# Patient Record
Sex: Female | Born: 1985 | Hispanic: Yes | Marital: Single | Smoking: Never smoker
Health system: Southern US, Community
[De-identification: ages and names within clinical notes are randomized; demographics above are authoritative.]

## PROBLEM LIST (undated history)

## (undated) DIAGNOSIS — K219 Gastro-esophageal reflux disease without esophagitis: Secondary | ICD-10-CM

## (undated) DIAGNOSIS — Z789 Other specified health status: Secondary | ICD-10-CM

## (undated) DIAGNOSIS — G43909 Migraine, unspecified, not intractable, without status migrainosus: Secondary | ICD-10-CM

---

## 2019-09-01 HISTORY — PX: CHOLECYSTECTOMY: SHX55

## 2019-09-04 ENCOUNTER — Encounter (INDEPENDENT_AMBULATORY_CARE_PROVIDER_SITE_OTHER): Payer: Self-pay | Admitting: Primary Care

## 2019-09-04 ENCOUNTER — Ambulatory Visit (INDEPENDENT_AMBULATORY_CARE_PROVIDER_SITE_OTHER): Payer: Self-pay | Admitting: Primary Care

## 2019-09-04 ENCOUNTER — Other Ambulatory Visit: Payer: Self-pay

## 2019-09-04 DIAGNOSIS — R29898 Other symptoms and signs involving the musculoskeletal system: Secondary | ICD-10-CM

## 2019-09-04 DIAGNOSIS — Z7689 Persons encountering health services in other specified circumstances: Secondary | ICD-10-CM

## 2019-09-04 DIAGNOSIS — K219 Gastro-esophageal reflux disease without esophagitis: Secondary | ICD-10-CM

## 2019-09-04 MED ORDER — OMEPRAZOLE 20 MG PO CPDR: 20.0000 mg | DELAYED_RELEASE_CAPSULE | Freq: Every day | ORAL | 3 refills | Status: DC

## 2019-09-04 MED ORDER — IBUPROFEN 600 MG PO TABS: 600.0000 mg | ORAL_TABLET | Freq: Three times a day (TID) | ORAL | 1 refills | Status: DC | PRN

## 2019-09-04 NOTE — Progress Notes (Signed)
Virtual Visit via Telephone Note  I connected with Monica Peters on 09/04/19 at  8:50 AM EST by telephone and verified that I am speaking with the correct person using two identifiers.   I discussed the limitations, risks, security and privacy concerns of performing an evaluation and management service by telephone and the availability of in person appointments. I also discussed with the patient that there may be a patient responsible charge related to this service. The patient expressed understanding and agreed to proceed.   History of Present Illness: Monica Peters is having a tele visit for establishing care . Complaining of weakness in bilateral hand as we were having tele visit needing to alternate hand.  No past medical history on file.  No current outpatient medications on file prior to visit.   No current facility-administered medications on file prior to visit.   Observations/Objective: Review of Systems  Gastrointestinal: Positive for abdominal pain and heartburn.       Pain at night and spicy food does cause   Neurological: Positive for weakness.       Hands bilateral pain  All other systems reviewed and are negative.   Assessment and Plan: Monica Peters was seen today for new patient (initial visit).  Diagnoses and all orders for this visit:  Encounter to establish care Monica Passe, NP-C will be your  (PCP) mastered prepared that is able to that will  diagnosed and treatment able to answer health concern as well as continuing care of varied medical conditions, not limited by cause, organ system, or diagnosis.   Gastroesophageal reflux disease without esophagitis Discussed eating small frequent meal, reduction in acidic foods, fried foods ,spicy foods, alcohol caffeine and tobacco and certain medications. Avoid laying down after eating 70mins-1hour, elevated head of the bed. Will prescribed omeprazole 20mg  as needed  Weakness of both hands Having pain right shoulder  to hand and left pain from elbow and fingers denies trauma. This has been going on for 5-6 years but notice it is getting worst.  Try anti inflammatory medication if no improvement return for in person appointment. Unknown etiology     Follow Up Instructions:    I discussed the assessment and treatment plan with the patient. The patient was provided an opportunity to ask questions and all were answered. The patient agreed with the plan and demonstrated an understanding of the instructions.   The patient was advised to call back or seek an in-person evaluation if the symptoms worsen or if the condition fails to improve as anticipated.  I provided 15 minutes of non-face-to-face time during this encounter.   , NP

## 2019-09-04 NOTE — Progress Notes (Signed)
Nov 2019  She had a lot of abdominal pain- was told she needed surgery but didn't have PCP  Pain more frequent for the last 3-4 months  Pt complains of pain in hands- can not move more than 10-15 minutes due to pain

## 2019-09-11 ENCOUNTER — Other Ambulatory Visit (INDEPENDENT_AMBULATORY_CARE_PROVIDER_SITE_OTHER): Payer: Self-pay | Admitting: Primary Care

## 2019-09-11 ENCOUNTER — Telehealth (INDEPENDENT_AMBULATORY_CARE_PROVIDER_SITE_OTHER): Payer: Self-pay

## 2019-09-11 NOTE — Telephone Encounter (Signed)
Patient called to see the status of her anti inflammatory medication. States PCP advice she would send the Rx to the pharmacy on jan 4.  Patient uses [id number] - [place], [place] - [address] AT [facility] [address]   Please advice [phone]  Thank you Lula Olszewski

## 2019-09-11 NOTE — Telephone Encounter (Signed)
Ibuprofen sent 09/04/2019

## 2019-09-11 NOTE — Telephone Encounter (Signed)
FWD to PCP

## 2019-10-05 ENCOUNTER — Ambulatory Visit (INDEPENDENT_AMBULATORY_CARE_PROVIDER_SITE_OTHER): Payer: Self-pay | Admitting: Primary Care

## 2019-10-06 ENCOUNTER — Other Ambulatory Visit: Payer: Self-pay

## 2019-10-06 ENCOUNTER — Encounter (INDEPENDENT_AMBULATORY_CARE_PROVIDER_SITE_OTHER): Payer: Self-pay | Admitting: Primary Care

## 2019-10-06 ENCOUNTER — Ambulatory Visit (INDEPENDENT_AMBULATORY_CARE_PROVIDER_SITE_OTHER): Payer: Self-pay | Admitting: Primary Care

## 2019-10-06 VITALS — BP 127/84 | HR 99 | Temp 97.3°F | Ht 66.54 in | Wt 246.4 lb

## 2019-10-06 DIAGNOSIS — G629 Polyneuropathy, unspecified: Secondary | ICD-10-CM

## 2019-10-06 DIAGNOSIS — Z23 Encounter for immunization: Secondary | ICD-10-CM

## 2019-10-06 MED ORDER — GABAPENTIN 100 MG PO CAPS: 100.0000 mg | ORAL_CAPSULE | Freq: Three times a day (TID) | ORAL | 3 refills | Status: DC

## 2019-10-06 NOTE — Progress Notes (Signed)
Established Patient Office Visit  Subjective:  Patient ID: Monica Peters, female    DOB: 1986/07/22  Age: 34 y.o. MRN: [id number]  CC:  Chief Complaint  Patient presents with  . Abdominal Pain  . Hand Pain    bilateral     HPI Monica Peters presents for acute visit bilateral shoulder pain that migrates to finger tips.    History reviewed. No pertinent past medical history.  History reviewed. No pertinent family history.  Social History   Socioeconomic History  . Marital status: Single    Spouse name: Not on file  . Number of children: Not on file  . Years of education: Not on file  . Highest education level: Not on file  Occupational History  . Not on file  Tobacco Use  . Smoking status: Never Smoker  . Smokeless tobacco: Never Used  Substance and Sexual Activity  . Alcohol use: Never  . Drug use: Never  . Sexual activity: Not Currently  Other Topics Concern  . Not on file  Social History Narrative  . Not on file   Social Determinants of Health   Financial Resource Strain:   . Difficulty of Paying Living Expenses: Not on file  Food Insecurity:   . Worried About Programme researcher, broadcasting/film/video in the Last Year: Not on file  . Ran Out of Food in the Last Year: Not on file  Transportation Needs:   . Lack of Transportation (Medical): Not on file  . Lack of Transportation (Non-Medical): Not on file  Physical Activity:   . Days of Exercise per Week: Not on file  . Minutes of Exercise per Session: Not on file  Stress:   . Feeling of Stress : Not on file  Social Connections:   . Frequency of Communication with Friends and Family: Not on file  . Frequency of Social Gatherings with Friends and Family: Not on file  . Attends Religious Services: Not on file  . Active Member of Clubs or Organizations: Not on file  . Attends Banker Meetings: Not on file  . Marital Status: Not on file  Intimate Partner Violence:   . Fear of Current or Ex-Partner: Not on file   . Emotionally Abused: Not on file  . Physically Abused: Not on file  . Sexually Abused: Not on file    Outpatient Medications Prior to Visit  Medication Sig Dispense Refill  . ibuprofen (ADVIL) 600 MG tablet Take 1 tablet (600 mg total) by mouth every 8 (eight) hours as needed for moderate pain. 90 tablet 1  . omeprazole (PRILOSEC) 20 MG capsule Take 1 capsule (20 mg total) by mouth daily. 30 capsule 3   No facility-administered medications prior to visit.    No Known Allergies  ROS Review of Systems  Musculoskeletal:       Hands painful every day especially at night . Pain increases with uses      Objective:    Physical Exam  Constitutional: She appears well-developed and well-nourished.  HENT:  Head: Normocephalic.  Eyes: Pupils are equal, round, and reactive to light. EOM are normal.  Cardiovascular: Normal rate and regular rhythm.  Pulmonary/Chest: Effort normal.  Abdominal: Soft. Bowel sounds are normal.  Musculoskeletal:     Cervical back: Normal range of motion.    BP 127/84 (BP Location: Right Arm, Patient Position: Sitting, Cuff Size: Large)   Pulse 99   Temp (!) 97.3 F (36.3 C) (Temporal)   Ht 5' 6.54" (1.69 m)  Wt 246 lb 6.4 oz (111.8 kg)   LMP 09/27/2019 (Exact Date)   SpO2 96%   BMI 39.13 kg/m  Wt Readings from Last 3 Encounters:  10/06/19 246 lb 6.4 oz (111.8 kg)     Health Maintenance Due  Topic Date Due  . HIV Screening  09/10/1998  . PAP SMEAR-Modifier  09/10/2004     Assessment & Plan:  Monica Peters was seen today for abdominal pain and hand pain.  Diagnoses and all orders for this visit:  Neuropathy Peripheral neuropathy develops slowly over time leading to a loss of sensation. Burning, stabbing, or aching pain in the legs or feet are common symptoms. abapentin (NEURONTIN) 100 MG capsule; Take 1 capsule (100 mg total) by mouth 3 (three) times daily.  Need for Tdap vaccination Tdap is recommended every 10 years for adults weekly or  primary she gets tetanus..  At least 1 of those doses should be with Tdap in adults age 61 and older who have previously received Tdap.  Recommend by the CDC. -     Tdap vaccine greater than or equal to 7yo IM  Need for immunization against influenza CDC recommends influenza vaccine yearly.   -     Flu Vaccine QUAD 36+ mos IM  Other orders -     gabapentin (NEURONTIN) 100 MG capsule; Take 1 capsule (100 mg total) by mouth 3 (three) times daily.    Meds ordered this encounter  Medications  . gabapentin (NEURONTIN) 100 MG capsule    Sig: Take 1 capsule (100 mg total) by mouth 3 (three) times daily.    Dispense:  90 capsule    Refill:  3    Follow-up: Return in about 2 months (around 12/04/2019) for schedule pap Tele for medication effectivness .    Kerin Perna, NP

## 2019-10-06 NOTE — Progress Notes (Signed)
Feels pain in hands when writing and the pain radiates to shoulder

## 2019-10-06 NOTE — Patient Instructions (Signed)
Dolor neuroptico Neuropathic Pain El dolor neuroptico se produce cuando hay dao en los nervios responsables de ciertas sensaciones del cuerpo (nervios sensitivos). Las causas del dolor pueden ser las siguientes:  Dao a los nervios sensitivos que envan seales a la mdula espinal y el cerebro (sistema nervioso perifrico).  Dao a los nervios sensitivos del cerebro o la mdula espinal (sistema nervioso central). El dolor neuroptico puede volverlo ms sensible al [employer]. An una sensacin leve puede sentirse Writer. Por lo general, es una enfermedad a largo plazo que puede ser difcil de tratar. El tipo de dolor difiere de Ardelia Mems persona a Theatre manager. Puede:  Comenzar en forma repentina (agudo) o puede desarrollarse lentamente y durar Management consultant (crnico).  Aparecer y Armed forces operational officer a medida que los nervios daados se curan, o puede permanecer estable durante aos.  Provoca [place], prdida de sueo y [place] calidad de vida deficiente. Cules son las causas? La causa ms frecuente de esta afeccin es la diabetes. El dolor neuroptico tambin puede producirse por muchas otras enfermedades y afecciones. Las causas del dolor neuroptico pueden clasificarse como:  Txicas. Esto es causado por medicamentos y sustancias qumicas. La causa ms comn del dolor neuroptico por exposicin a sustancias txicas es el dao que producen los tratamientos para Science writer (quimioterapia).  Metablicas. Esto puede ser consecuencia de: ? Diabetes. Esta es la enfermedad ms comn que provoca daos en los nervios. ? Falta de vitamina B debido al consumo de alcohol prolongado.  Traumticas. Las lesiones que cortan, presionan o estiran un nervio pueden producir dao y Social research officer, government. Un ejemplo comn es sentir dolor despus de perder un brazo o una pierna (dolor de miembro fantasma).  Causas relacionadas con la compresin. Si un nervio sensitivo queda atrapado o comprimido por [employer], el suministro de sangre al  nervio puede interrumpirse.  Vascular. Muchas enfermedades de los vasos sanguneos pueden producir dolor neuroptico al reducir el suministro de [place] y el oxgeno que Lucianne Lei a los nervios.  Autoinmune. Este tipo de Social research officer, government se produce por las [employer] el sistema de defensa del cuerpo (sistema inmunitario) ataca por error los nervios sensitivos. Entre los ejemplos de enfermedades autoinmunes que pueden causar dolor neuroptico se incluyen el lupus y Curator.  Infecciosa. Muchos tipos de infecciones virales pueden daar los nervios sensitivos y Engineer, drilling. La infeccin por culebrilla (virus del herpes zster) es una causa comn de este tipo de Social research officer, government.  Heredados. El dolor neuroptico puede ser un sntoma de muchas enfermedades que se transmiten entre los miembros de las familias (genticas). Qu incrementa el riesgo? Es ms probable que usted sufra esta afeccin si:  Tiene diabetes.  Fuma.  Bebe alcohol en exceso.  Toma determinados medicamentos, incluidos medicamentos que destruyen las clulas cancerosas (quimioterapia) o que se utilizan para tratar trastornos del sistema inmunitario. Cules son los signos o los sntomas? El sntoma principal es Conservation officer, historic buildings. El dolor neuroptico a menudo se describe como:  Insurance claims handler.  Similar a un choque.  Escozor.  Fro o calor.  Picazn. Cmo se diagnostica? No hay un estudio que pueda diagnosticar el dolor neuroptico. Se diagnostica en funcin de lo siguiente:  Un examen fsico y sus sntomas. El mdico le har preguntas acerca de su dolor. Pueden solicitarle que use una escala de dolor para describir la intensidad del dolor.  Estudios. Estos pueden realizarse para corroborar su sensibilidad al dolor y para ayudar a Animator causa y la ubicacin de los daos en los nervios sensitivos. Estos  incluyen los siguientes: ? Estudios de conduccin nerviosa para evaluar si las seales nerviosas pasan por los nervios  sensitivos en forma correcta o no (estudios electrodiagnsticos). ? Estimulacin de los nervios sensitivos a travs de electrodos que se colocan en la piel, y la medicin de la respuesta en la mdula espinal y el cerebro (potencial evocado somatosensorial).  Estudios de diagnstico por imgenes, por ejemplo: ? Radiografas. ? Exploracin por tomografa computarizada (TC). ? Resonancia magntica (RM). Cmo se trata? El tratamiento para el dolor neuroptico puede cambiar con el Marrero. Es posible que necesite probar distintas opciones de tratamientos o una combinacin de tratamientos. Entre las opciones se incluyen las siguientes:  Tratamiento de la causa preexistente de la neuropata, como la diabetes, una enfermedad renal o la deficiencia de vitaminas.  Interrupcin de los [employer] pueden causar la neuropata, como la quimioterapia.  Medicamentos para Engineer, materials. Entre los medicamentos se [employer] siguientes: ? Analgsicos de venta libre o recetados. ? Anticonvulsivos. ? Antidepresivos. ? Parches analgsicos que se colocan en las zonas de la piel que le duelen. ? Un medicamento para adormecer el rea (anestesia local), que puede inyectarse como un bloqueo nervioso.  Neuroestimulacin transcutnea. En este tratamiento se utilizan corrientes elctricas para bloquear las seales nerviosas dolorosas. El tratamiento es indoloro.  Tratamientos alternativos, como: ? Acupuntura. ? Meditacin. ? Masajes. ? Fisioterapia. ? Programas para el control del dolor. ? Psicoterapia. Siga estas indicaciones en su casa: Medicamentos   [employer] International de venta libre y los recetados solamente como se lo haya indicado el mdico.  No conduzca ni use maquinaria pesada mientras toma analgsicos recetados.  Si toma analgsicos recetados, tome medidas para prevenir o tratar el estreimiento. El mdico puede recomendarle que: ? Beba suficiente lquido como para mantener la orina de  color amarillo plido. ? Consuma alimentos ricos en fibra, como frutas y verduras frescas, cereales integrales y frijoles. ? Limite el consumo de alimentos ricos en grasa y azcares procesados, como los alimentos fritos o dulces. ? Tome un medicamento recetado o de venta libre para el estreimiento. Estilo de vida   Tenga un buen sistema de Immunologist.  Considere la opcin de unirse a un grupo de apoyo para Chief Technology Officer crnico.  No consuma ningn producto que contenga nicotina o tabaco, como cigarrillos y Administrator, Civil Service. Si necesita ayuda para dejar de fumar, consulte al mdico.  No beba alcohol. Instrucciones generales  Infrmese todo lo que pueda sobre su enfermedad.  Trabaje en estrecha colaboracin con todos sus mdicos para hallar el tratamiento ms adecuado para usted.  Pregntele al mdico qu actividades son seguras para usted.  Concurra a todas las visitas de control como se lo haya indicado el mdico. Esto es importante. Comunquese con un mdico si:  Sus tratamientos para el dolor no funcionan.  Los [employer] Us causan [employer] Corporation.  Est lidiando con sntomas de cansancio (fatiga), cambios en el estado de nimo, depresin o ansiedad. Resumen  El dolor neuroptico se produce cuando hay dao en los nervios responsables de ciertas sensaciones del cuerpo (nervios sensitivos).  El dolor neuroptico puede aparecer y Geneticist, molecular a medida que los nervios daados se curan, o puede permanecer estable durante aos.  Por lo general, el dolor neuroptico es una enfermedad a largo plazo que puede ser difcil de Warehouse manager. Considere la opcin de unirse a un grupo de apoyo para Chief Technology Officer crnico. Esta informacin no tiene Theme park manager el consejo del mdico. Asegrese de hacerle al mdico  cualquier pregunta que tenga. Document Revised: 10/27/2017 Document Reviewed: 10/27/2017 Elsevier Patient Education  2020 [employer].

## 2019-10-25 DIAGNOSIS — R7989 Other specified abnormal findings of blood chemistry: Secondary | ICD-10-CM | POA: Insufficient documentation

## 2019-12-04 ENCOUNTER — Other Ambulatory Visit: Payer: Self-pay

## 2019-12-04 ENCOUNTER — Telehealth (INDEPENDENT_AMBULATORY_CARE_PROVIDER_SITE_OTHER): Payer: Self-pay | Admitting: Primary Care

## 2019-12-04 DIAGNOSIS — G629 Polyneuropathy, unspecified: Secondary | ICD-10-CM

## 2019-12-04 NOTE — Progress Notes (Signed)
Virtual Visit via Telephone Note  I connected with [name] on 12/04/19 at 10:30 AM EDT by telephone and verified that I am speaking with the correct person using two identifiers.   I discussed the limitations, risks, security and privacy concerns of performing an evaluation and management service by telephone and the availability of in person appointments. I also discussed with the patient that there may be a patient responsible charge related to this service. The patient expressed understanding and agreed to proceed.   History of Present Illness: [name] is a 34 year old female having tele for medication follow up on gabapentin 100mg  three times a day for numbness and tingling in her arms and fingers. Today she states her hand gets stiff and it is difficult to use.Fingers has stiffness ,last of sensation  Medication is a little better but not much.  No past medical history on file.  Current Outpatient Medications on File Prior to Visit  Medication Sig Dispense Refill  . gabapentin (NEURONTIN) 100 MG capsule Take 1 capsule (100 mg total) by mouth 3 (three) times daily. (Patient taking differently: Take 100 mg by mouth 3 (three) times daily. Take 2 100mg  in the morning and 100mg  at lunch bedtime (3) 100mg ) 90 capsule 3  . ibuprofen (ADVIL) 600 MG tablet Take 1 tablet (600 mg total) by mouth every 8 (eight) hours as needed for moderate pain. 90 tablet 1  . omeprazole (PRILOSEC) 20 MG capsule Take 1 capsule (20 mg total) by mouth daily. 30 capsule 3   No current facility-administered medications on file prior to visit.   Observations/Objective: Review of Systems  Musculoskeletal:       Arm and hand pain and numbness  All other systems reviewed and are negative.  Assessment and Plan: Prentiss was seen today for medication effectiveness. On previous visit started on gabapentin 100 mg three time a day. This had little improvement   Diagnoses and all orders for this  visit:  Neuropathy Peripheral neuropathy develops slowly over time leading to a loss of sensation. Increased gabapentin to 2 in AM she wakes up with hands and finger stinging sensation and at bed time she will remain at 1 tablet mid day.    Follow Up Instructions:    I discussed the assessment and treatment plan with the patient. The patient was provided an opportunity to ask questions and all were answered. The patient agreed with the plan and demonstrated an understanding of the instructions.   The patient was advised to call back or seek an in-person evaluation if the symptoms worsen or if the condition fails to improve as anticipated.  I provided 8 minutes of non-face-to-face time during this encounter.   , NP

## 2019-12-27 ENCOUNTER — Other Ambulatory Visit: Payer: Self-pay

## 2019-12-27 ENCOUNTER — Ambulatory Visit: Payer: Self-pay

## 2020-01-26 ENCOUNTER — Encounter (INDEPENDENT_AMBULATORY_CARE_PROVIDER_SITE_OTHER): Payer: Self-pay | Admitting: Primary Care

## 2020-01-26 ENCOUNTER — Other Ambulatory Visit: Payer: Self-pay

## 2020-01-26 ENCOUNTER — Telehealth (INDEPENDENT_AMBULATORY_CARE_PROVIDER_SITE_OTHER): Payer: Self-pay | Admitting: Primary Care

## 2020-01-26 DIAGNOSIS — G629 Polyneuropathy, unspecified: Secondary | ICD-10-CM

## 2020-01-26 MED ORDER — GABAPENTIN 300 MG PO CAPS: 300.0000 mg | ORAL_CAPSULE | Freq: Three times a day (TID) | ORAL | 1 refills | Status: DC

## 2020-01-26 NOTE — Progress Notes (Signed)
Gabapentin helping some. When she eats she still has pain in her hands and still having pain at night but not as bad as it was before  Pt is having pain in her hand that she is currently holding her phone with

## 2020-01-26 NOTE — Progress Notes (Addendum)
Virtual Visit via Telephone Note  I connected with Monica Peters on 01/26/20 at  9:50 AM EDT by telephone and verified that I am speaking with the correct person using two identifiers.   I discussed the limitations, risks, security and privacy concerns of performing an evaluation and management service by telephone and the availability of in person appointments. I also discussed with the patient that there may be a patient responsible charge related to this service. The patient expressed understanding and agreed to proceed. Patient home  Monica Passe, NP was at [facility]    History of Present Illness: [name] is 34 year female having a tele visit for effectiveness of Gabapentin . States it is helping some. Pain in fingers is worst in the evening . Increase when cooking, chopping food and needing to switch hands while holding the phone.  No past medical history on file.  No past medical history on file.   Observations/Objective: Review of Systems  Neurological: Positive for tingling and weakness.  All other systems reviewed and are negative.   Assessment and Plan: Monica Peters was seen today for medication effectiveness.  Diagnoses and all orders for this visit:  Neuropathy See HPI- increase gabapentin to 300mg  three times a day. She is experiencing  neuropathy which is causing aching pain, weakness and numbness and tingling. If no improvement will refer to ortho/neurology.   Follow Up Instructions:    I discussed the assessment and treatment plan with the patient. The patient was provided an opportunity to ask questions and all were answered. The patient agreed with the plan and demonstrated an understanding of the instructions.   The patient was advised to call back or seek an in-person evaluation if the symptoms worsen or if the condition fails to improve as anticipated.  I provided 12 minutes of non-face-to-face time during this  encounter.   , NP

## 2020-02-12 ENCOUNTER — Telehealth: Payer: Self-pay | Admitting: Primary Care

## 2020-02-12 NOTE — Telephone Encounter (Signed)
Pt was advice that to bring the bill to the clinic that be able to take care

## 2020-04-12 ENCOUNTER — Encounter (INDEPENDENT_AMBULATORY_CARE_PROVIDER_SITE_OTHER): Payer: Self-pay | Admitting: Primary Care

## 2020-04-12 ENCOUNTER — Other Ambulatory Visit: Payer: Self-pay

## 2020-04-12 ENCOUNTER — Ambulatory Visit (INDEPENDENT_AMBULATORY_CARE_PROVIDER_SITE_OTHER): Payer: Self-pay | Admitting: Primary Care

## 2020-04-12 VITALS — BP 125/73 | HR 76 | Wt 244.6 lb

## 2020-04-12 DIAGNOSIS — G4733 Obstructive sleep apnea (adult) (pediatric): Secondary | ICD-10-CM

## 2020-04-12 DIAGNOSIS — R0602 Shortness of breath: Secondary | ICD-10-CM

## 2020-04-12 DIAGNOSIS — Z6839 Body mass index (BMI) 39.0-39.9, adult: Secondary | ICD-10-CM

## 2020-04-12 DIAGNOSIS — E6609 Other obesity due to excess calories: Secondary | ICD-10-CM

## 2020-04-12 MED ORDER — ALBUTEROL SULFATE HFA 108 (90 BASE) MCG/ACT IN AERS: 2.0000 | INHALATION_SPRAY | Freq: Four times a day (QID) | RESPIRATORY_TRACT | 0 refills | Status: AC | PRN

## 2020-04-12 MED FILL — ALBUTEROL SULFATE HFA 108 (: 108 (90 BAS | 25 days supply | Qty: 18 | Fill #0

## 2020-04-12 NOTE — Patient Instructions (Signed)
Falta de ar, adultos Shortness of Breath, Adult Falta de ar significa que voc tem problemas para respirar. A falta de ar pode ser um sinal de algum problema mdico. Siga essas instrues em casa:   Fique atento a eventuais alteraes em seus sintomas.  No use nenhum produto que contenha nicotina ou tabaco, como cigarros tradicionais, cigarros eletrnicos e fumo de Theatre manager.  No fume. O tabagismo pode causar falta de ar. Caso precise de ajuda para parar de fumar, fale com seu mdico.  Evite coisas que podem dificultar a respirao, tais como: ? Mofo. ? Poeira. ? Poluio do ar. ? Cheiros de substncias qumicas. ? Coisas que possam causar sintomas de alergia (alrgenos), caso voc [employer].  Mantenha sua casa limpa. Use produtos que ajudem a remover o mofo e a poeira.  Repouse conforme necessrio. Retorne gradualmente s suas atividades normais.  Tome medicamentos vendidos com ou sem prescrio somente de acordo com as indicaes do seu mdico. Isso inclui oxigenoterapia e outros medicamento por via inalatria.  Comparea a todas as consultas de acompanhamento de acordo com as orientaes do seu mdico. Isso  importante. Entre em contato com um mdico se:  Seu quadro clnico no melhorar quando esperado.  Tiver dificuldades para realizar atividades normais mesmo depois de repousar.  Surgirem novos sintomas. Tomasa Hose ajuda imediatamente se:  A falta de ar piorar.  Tiver dificuldade para respirar quando em repouso.  Sentir Equities trader.  Ocorrer tosse no aliviada por medicamentos.  Tossir sangue.  Sentir dor Production assistant, radio.  Sentir dor no peito, braos, ombros ou barriga (abdome).  Tiver febre.  No conseguir subir escadas.  No conseguir fazer exerccios Horticulturist, commercial. Esses sintomas podem representar um problema srio e ser [employer]. No espere para ver se os sintomas desaparecem. Procure um mdico imediatamente. Ligue para o nmero de  Technical brewer (911, nos EUA). No dirija por conta prpria at o hospital. Resumo  Falta de ar  quando voc tem dificuldade de respirar ar suficiente. Ela pode ser um sinal de um problema mdico.  Evite coisas que atrapalhem a respirao, como fumar, poluio, mofo e poeira.  Fique atento a eventuais alteraes em seus sintomas. Entre em contato com seu mdico se voc no Industrial/product designer. Estas informaes no se destinam a substituir as recomendaes de seu mdico. No deixe de discutir quaisquer dvidas com seu mdico. Document Revised: 03/01/2018 Document Reviewed: 03/01/2018 Elsevier Patient Education  2020 [employer].

## 2020-04-12 NOTE — Progress Notes (Signed)
Patient is here today for SOB that has been going on for 3 weeks.  Having pain in back of neck.

## 2020-04-12 NOTE — Progress Notes (Signed)
Established Patient Office Visit  Subjective:  Patient ID: Monica Peters, female    DOB: 20-Jul-1986  Age: 34 y.o. MRN: [id number]  CC:  Chief Complaint  Patient presents with  . Shortness of Breath    HPI [name] is a 34 year old Hispanic female who speaks and understands English but prefers using  Language  Line.(interpreter Randall Hiss [phone]) . She presents for intermittent shortness of breath not related to  yny specific.  This has been lasting for several weeks.  History reviewed. No pertinent past medical history.  History reviewed. No pertinent surgical history.  History reviewed. No pertinent family history.  Social History   Socioeconomic History  . Marital status: Single    Spouse name: Not on file  . Number of children: Not on file  . Years of education: Not on file  . Highest education level: Not on file  Occupational History  . Not on file  Tobacco Use  . Smoking status: Never Smoker  . Smokeless tobacco: Never Used  Substance and Sexual Activity  . Alcohol use: Never  . Drug use: Never  . Sexual activity: Not Currently  Other Topics Concern  . Not on file  Social History Narrative  . Not on file   Social Determinants of Health   Financial Resource Strain:   . Difficulty of Paying Living Expenses:   Food Insecurity:   . Worried About Charity fundraiser in the Last Year:   . Arboriculturist in the Last Year:   Transportation Needs:   . Film/video editor (Medical):   Marland Kitchen Lack of Transportation (Non-Medical):   Physical Activity:   . Days of Exercise per Week:   . Minutes of Exercise per Session:   Stress:   . Feeling of Stress :   Social Connections:   . Frequency of Communication with Friends and Family:   . Frequency of Social Gatherings with Friends and Family:   . Attends Religious Services:   . Active Member of Clubs or Organizations:   . Attends Archivist Meetings:   Marland Kitchen Marital Status:   Intimate  Partner Violence:   . Fear of Current or Ex-Partner:   . Emotionally Abused:   Marland Kitchen Physically Abused:   . Sexually Abused:     Outpatient Medications Prior to Visit  Medication Sig Dispense Refill  . ibuprofen (ADVIL) 600 MG tablet Take 1 tablet (600 mg total) by mouth every 8 (eight) hours as needed for moderate pain. 90 tablet 1  . gabapentin (NEURONTIN) 300 MG capsule Take 1 capsule (300 mg total) by mouth 3 (three) times daily. (Patient not taking: Reported on 04/12/2020) 90 capsule 1  . omeprazole (PRILOSEC) 20 MG capsule Take 1 capsule (20 mg total) by mouth daily. (Patient not taking: Reported on 04/12/2020) 30 capsule 3   No facility-administered medications prior to visit.    No Known Allergies  ROS Review of Systems  Respiratory: Positive for shortness of breath.   Neurological: Positive for headaches.  Psychiatric/Behavioral: Positive for sleep disturbance.  All other systems reviewed and are negative.     Objective:    Physical Exam Vitals reviewed.  Constitutional:      Appearance: She is obese.  HENT:     Head: Normocephalic.  Cardiovascular:     Rate and Rhythm: Normal rate and regular rhythm.  Pulmonary:     Effort: Pulmonary effort is normal.     Breath sounds: Normal breath sounds.  Abdominal:  General: Bowel sounds are normal.  Musculoskeletal:        General: Normal range of motion.  Skin:    General: Skin is warm and dry.  Neurological:     Mental Status: She is alert and oriented to person, place, and time.  Psychiatric:        Mood and Affect: Mood normal.        Behavior: Behavior normal.     BP 125/73   Pulse 76   Wt 244 lb 9.6 oz (110.9 kg)   SpO2 98%   BMI 38.85 kg/m  Wt Readings from Last 3 Encounters:  04/12/20 244 lb 9.6 oz (110.9 kg)  10/06/19 246 lb 6.4 oz (111.8 kg)     Health Maintenance Due  Topic Date Due  . Hepatitis C Screening  Never done  . HIV Screening  Never done  . PAP SMEAR-Modifier  Never done  .  INFLUENZA VACCINE  03/31/2020    There are no preventive care reminders to display for this patient.  No results found for: TSH No results found for: WBC, HGB, HCT, MCV, PLT No results found for: NA, K, CHLORIDE, CO2, GLUCOSE, BUN, CREATININE, BILITOT, ALKPHOS, AST, ALT, PROT, ALBUMIN, CALCIUM, ANIONGAP, EGFR, GFR No results found for: CHOL No results found for: HDL No results found for: LDLCALC No results found for: TRIG No results found for: CHOLHDL No results found for: HGBA1C    Assessment & Plan:  Monica Peters was seen today for shortness of breath.  Diagnoses and all orders for this visit:  OSA (obstructive sleep apnea) Patient presents with possible obstructive sleep apnea. Patent has a 1 year history of symptoms of daytime fatigue, morning fatigue and morning headache. Patient generally gets 3 or 4 hours of sleep per night, and states they generally have difficulty falling asleep. Snoring of moderate severity is present. Apneic episodes is present. Nasal obstruction is not present.  Patient has not had tonsillectomy.   Class 2 obesity due to excess calories without serious comorbidity with body mass index (BMI) of 39.0 to 39.9 in adult Obesity is 30-39 indicating an excess in caloric intake or underlining conditions. This may lead to other co-morbidities. HTN, CAD, hypoventilation syndrome  Lifestyle modifications of diet and exercise may reduce obesity.   Shortness of breath Will refer to Dr. Joya Gaskins pulmonologist and prescribed a short acting bronchodilator for rescue with shortness of breath.  Meds ordered this encounter  Medications  . albuterol (VENTOLIN HFA) 108 (90 Base) MCG/ACT inhaler    Sig: Inhale 2 puffs into the lungs every 6 (six) hours as needed for wheezing or shortness of breath.    Dispense:  8 g    Refill:  0    Follow-up: Return if symptoms worsen or fail to improve, for First avaiable with Dr. Joya Gaskins and Pap.    Kerin Perna, NP

## 2020-04-24 ENCOUNTER — Other Ambulatory Visit (HOSPITAL_COMMUNITY)
Admission: RE | Admit: 2020-04-24 | Discharge: 2020-04-24 | Disposition: A | Discharge status: Home / Self Care | Payer: Self-pay | Source: Ambulatory Visit | Attending: Primary Care | Admitting: Primary Care

## 2020-04-24 ENCOUNTER — Other Ambulatory Visit: Payer: Self-pay

## 2020-04-24 ENCOUNTER — Ambulatory Visit (INDEPENDENT_AMBULATORY_CARE_PROVIDER_SITE_OTHER): Payer: Self-pay | Admitting: Primary Care

## 2020-04-24 ENCOUNTER — Encounter (INDEPENDENT_AMBULATORY_CARE_PROVIDER_SITE_OTHER): Payer: Self-pay | Admitting: Primary Care

## 2020-04-24 VITALS — BP 124/84 | HR 77 | Temp 98.1°F | Resp 16 | Wt 242.2 lb

## 2020-04-24 DIAGNOSIS — Z1159 Encounter for screening for other viral diseases: Secondary | ICD-10-CM

## 2020-04-24 DIAGNOSIS — N898 Other specified noninflammatory disorders of vagina: Secondary | ICD-10-CM

## 2020-04-24 DIAGNOSIS — E6609 Other obesity due to excess calories: Secondary | ICD-10-CM

## 2020-04-24 DIAGNOSIS — Z0000 Encounter for general adult medical examination without abnormal findings: Secondary | ICD-10-CM

## 2020-04-24 DIAGNOSIS — Z01419 Encounter for gynecological examination (general) (routine) without abnormal findings: Secondary | ICD-10-CM

## 2020-04-24 DIAGNOSIS — Z124 Encounter for screening for malignant neoplasm of cervix: Secondary | ICD-10-CM

## 2020-04-24 DIAGNOSIS — N921 Excessive and frequent menstruation with irregular cycle: Secondary | ICD-10-CM

## 2020-04-24 DIAGNOSIS — Z114 Encounter for screening for human immunodeficiency virus [HIV]: Secondary | ICD-10-CM

## 2020-04-24 DIAGNOSIS — Z6839 Body mass index (BMI) 39.0-39.9, adult: Secondary | ICD-10-CM

## 2020-04-24 NOTE — Progress Notes (Signed)
  Subjective:      Ms. Kayzlee Wirtanen Su Grand is a 34 y.o. woman who comes in today for a  pap smear  And health maintenance only.  Her most recent Pap smear was unknown. She does express having some dental pain . Information will be provided for dental options.   Review of Systems Pertinent items are noted in HPI.   Objective:    BP 124/84   Pulse 77   Temp 98.1 F (36.7 C)   Resp 16   Wt 242 lb 3.2 oz (109.9 kg)   SpO2 99%   BMI 38.47 kg/m    Assessment:  CONSTITUTIONAL: Well-developed, well-nourished obese  female in no acute distress.  HENT:  Normocephalic, atraumatic, External right and left ear normal. Oropharynx is clear and moist EYES: Conjunctivae and EOM are normal. Pupils are equal, round, and reactive to light. No scleral icterus.  NECK: Normal range of motion, supple, no masses.  Normal thyroid.  SKIN: Skin is warm and dry. No rash noted. Not diaphoretic. No erythema. No pallor. [place]: Alert and oriented to person, place, and time. Normal reflexes, muscle tone coordination. No cranial nerve deficit noted. PSYCHIATRIC: Normal mood and affect. Normal behavior. Normal judgment and thought content. CARDIOVASCULAR: Normal heart rate noted, regular rhythm RESPIRATORY: Clear to auscultation bilaterally. Effort and breath sounds normal, no problems with respiration noted. BREASTS: Symmetric in size. No masses, skin changes, nipple drainage, or lymphadenopathy. Taught SBE demonstrated on left she return demonstrated on the right  ABDOMEN: Soft, normal bowel sounds, no distention noted.  No tenderness, rebound or guarding.  PELVIC: Normal appearing external genitalia; normal appearing vaginal mucosa and cervix.  No abnormal discharge noted.  Pap smear obtained.  Normal uterine size, no other palpable masses, no uterine or adnexal tenderness. MUSCULOSKELETAL: Normal range of motion. No tenderness.  No cyanosis, clubbing, or edema.  2+ distal pulses.  Screening pap smear.    Plan:    Follow up in 3 years, or as indicated by Pap results.   Ryleeann was seen today for gynecologic exam.  Diagnoses and all orders for this visit:  Cervical cancer screening -     Cytology - PAP  Vaginal discharge -     Cervicovaginal ancillary only  Encounter for screening for HIV -     HIV Antibody (routine testing w rflx)  Encounter for hepatitis C screening test for low risk patient -     Hepatitis C Antibody  Menorrhagia with irregular cycle -     CBC with Differential  Class 2 obesity due to excess calories without serious comorbidity with body mass index (BMI) of 39.0 to 39.9 in adult -     CMP14+EGFR; Future -     Lipid Panel  Healthcare maintenance -     CMP14+EGFR; Future  Encounter for gynecological examination without abnormal finding Completed

## 2020-04-24 NOTE — Patient Instructions (Addendum)
Prueba de Papanicolaou Pap Test Por qu me debo realizar esta prueba? La prueba de Papanicolaou, tambin denominada citologa vaginal, es una prueba de cribado para Hydrographic surveyor signos de:  Cncer de la vagina, del cuello uterino y del tero. El cuello uterino es la parte baja del tero que se abre hacia la vagina.  Infeccin.  Cambios que podran ser un signo de que se est desarrollando un cncer (cambios precancerosos). Las mujeres deben realizarse esta prueba con regularidad. En general, debe hacerse una prueba de Papanicolaou cada 3 aos hasta alcanzar la menopausia o hasta los 65 aos. Las [employer] 30 y 68 aos de edad pueden elegir realizarse la prueba de Papanicolaou al mismo tiempo que la prueba del VPH (virus del papiloma humano) cada 5 aos (en lugar de cada 3 aos). El mdico puede recomendarle que se realice pruebas de Papanicolaou con ms o menos frecuencia en funcin de sus afecciones mdicas y los resultados de la prueba de Papanicolaou anterior. Qu tipo de [place] se toma?  El mdico recolectar una muestra de clulas de la superficie del cuello uterino. Lo har utilizando un pequeo hisopo de algodn, una esptula de plstico o un cepillo. Esta muestra se recolecta durante un examen plvico, mientras usted est recostada boca arriba sobre la mesa de examen con los pies en los descansos para pies (estribos). En algunos casos, tambin pueden recolectarse fluidos (secreciones) del cuello uterino y la vagina. Cmo debo prepararme para esta prueba?  Tenga en cuenta en qu etapa del ciclo menstrual se encuentra. Es posible que se le pida que vuelva a Risk manager la prueba si est Forensic psychologist en que debe Radiation protection practitioner.  Si el da en que debe realizarse la prueba tiene una infeccin vaginal aparente, deber volver a Editor, commissioning prueba.  Siga las indicaciones del mdico acerca de lo siguiente: ? Cambiar o suspender los medicamentos que toma habitualmente. Algunos  medicamentos pueden [employer] de la prueba, como los digitlicos y Lexicographer. ? Evite las duchas vaginales o los baos de inmersin el da de la prueba o Games developer anterior. Informe al mdico acerca de lo siguiente:  Cualquier alergia que tenga.  Todos los [employer], incluidos vitaminas, hierbas, gotas oftlmicas, cremas y medicamentos de venta libre.  Cualquier enfermedad de la sangre que tenga.  Cirugas a las que se someti.  Cualquier afeccin mdica que tenga.  Si est embarazada o podra estarlo. Cmo se informan los resultados? Los [employer] de la prueba se informarn como anormales o normales. Puede producirse un resultado positivo falso. Este tipo de resultado es incorrecto porque indica que una enfermedad est presente cuando en realidad no lo est. Puede producirse un resultado negativo falso. Este tipo de resultado es incorrecto porque indica que una enfermedad no est presente cuando en realidad lo est. Qu significan los [place]? Un resultado normal en la prueba significa que no tiene signos de cncer de la vagina, del cuello uterino o del tero. Un resultado anormal puede significar que tiene:  Cncer. Una prueba de Papanicolaou por s sola no es suficiente para Community education officer. En este caso, se le realizarn ms pruebas.  Cambios precancerosos en la vagina, cuello uterino o tero.  Inflamacin del cuello uterino.  Enfermedades de transmisin sexual (ETS).  Infecciones por hongos.  Infecciones por parsitos. Hable con su mdico sobre lo que significan sus [place]. Preguntas para hacerle al mdico Consulte a su mdico o pregunte en el departamento donde se realiza la prueba acerca de lo siguiente:  Cundo estarn disponibles mis resultados?  Cmo obtendr mis resultados?  Cules son mis opciones de tratamiento?  Qu otras pruebas necesito?  Cules son los prximos pasos que debo seguir? Resumen  En  general, las mujeres deben hacerse una prueba de Papanicolaou cada 3 aos Teacher, English as a foreign language la menopausia o [employer] 68 aos de [place].  El mdico recolectar una muestra de clulas de la superficie del cuello uterino. Lo har utilizando un pequeo hisopo de algodn, una esptula de plstico o un cepillo.  En algunos casos, tambin pueden recolectarse fluidos (secreciones) del cuello uterino y la vagina. Esta informacin no tiene Marine scientist el consejo del mdico. Asegrese de hacerle al mdico cualquier pregunta que tenga. Document Revised: 07/27/2017 Document Reviewed: 07/27/2017 Elsevier Patient Education  2020 [place] preventivos en las [place] de 21 a 46 aos de edad Preventive Care 38-12 Years Old, Female Los cuidados preventivos hacen referencia a las opciones en cuanto a las visitas al mdico y al estilo de vida, las cuales pueden promover la salud y Musician. Esto puede comprender lo siguiente:  Un examen fsico anual. Esto tambin se puede llamar control de bienestar anual.  Visitas regulares al dentista y exmenes oculares.  Vacunas.  Estudios para Engineer, building services.  Opciones saludables de estilo de vida, como seguir una dieta saludable, hacer ejercicio regularmente, no usar drogas ni productos que contengan nicotina y tabaco, y limitar el consumo de alcohol. Qu puedo esperar para mi visita de cuidado preventivo? Examen fsico El mdico revisar lo siguiente:  IT consultant y [place]. Esto se puede usar para calcular el ndice de masa corporal ([place]), que indica si tiene un peso saludable.  Frecuencia cardaca y presin arterial.  Piel para detectar manchas anormales. Asesoramiento Su mdico puede preguntarle acerca de:  Consumo de tabaco, alcohol y drogas.  Su bienestar emocional.  El bienestar en el hogar y sus relaciones personales.  Su actividad sexual.  Sus hbitos de alimentacin.  Su trabajo y [place] laboral.  Mtodos  anticonceptivos.  Su ciclo menstrual.  Sus antecedentes de Media planner. Qu vacunas necesito?  [place] antigripal  Se recomienda aplicarse esta vacuna todos los [place]. Vacuna contra el ttanos, difteria y tos ferina (Tdap)  Es posible que tenga que aplicarse un refuerzo contra el ttanos y la difteria (DT) cada 10aos. Vacuna contra la varicela  Es posible que tenga que aplicrsela si no recibi esta vacuna. Vacuna contra el virus del papiloma humano (VPH)  Si el mdico se lo recomienda, Research scientist (physical sciences) tres dosis a lo largo de 6 meses. Vacuna contra el sarampin, rubola y paperas (SRP)  Tendr que aplicarse por lo menos una dosis de la SRP. Podra tambin necesitar una segunda dosis. Vacuna antimeningoccica conjugada (MenACWY)  Se recomienda la aplicacin de Ardelia Mems dosis si tiene entre 19 y 21aos, y es estudiante universitario de Psychologist, educational ao que vive en una residencia estudiantil, o si tiene una de varias enfermedades. Podra tambin necesitar dosis de refuerzo. Vacuna antineumoccica conjugada (PCV13)  Puede necesitar esta vacuna si tiene determinadas enfermedades y no se vacun anteriormente. Edward Jolly antineumoccica de polisacridos (PPSV23)  Quizs tenga que aplicarse una o dos dosis si fuma o si tiene determinadas afecciones. Vacuna contra la hepatitis A  Es posible que necesite esta vacuna si tiene ciertas afecciones o si viaja o trabaja en lugares en los que podra estar expuesta a la hepatitis A. Vacuna contra la hepatitis B  Es posible que necesite esta vacuna si tiene ciertas afecciones o si  viaja o trabaja en lugares en los que podra estar expuesta a la hepatitis B. Vacuna antihaemophilus influenzae tipo B (Hib)  Puede necesitar esta vacuna si tiene determinadas afecciones. Puede recibir las vacunas en forma de dosis individuales o en forma de dos o ms vacunas juntas en la misma inyeccin (vacunas combinadas). Hable con su mdico [employer] y beneficios de las  vacunas combinadas. Qu pruebas necesito?  Anlisis de [employer] de lpidos y colesterol. Estos se pueden verificar cada 5 aos, a partir de los 17 aos de [place].  Anlisis de hepatitisC.  Anlisis de hepatitisB. Pruebas de deteccin  Pruebas de deteccin de la diabetes. Esto se Set designer un control del azcar en la sangre (glucosa) despus de no haber comido durante un periodo de tiempo (ayuno).  Anlisis de enfermedades de transmisin sexual (ETS).  Pruebas de deteccin de cncer relacionado con las mutaciones del BRCA. Es posible que se las deba realizar si tiene antecedentes de cncer de mama, de ovario, de trompas o peritoneal.  Examen plvico y prueba de Papanicolaou. Esto se puede realizar cada 52aos a Renato Gails de los 21aos de edad. A partir de los 30 aos, esto se puede Optometrist cada 5 aos si usted se realiza una prueba de Papanicolaou en combinacin con una prueba de deteccin del virus del papiloma humano (VPH). Hable con su mdico [employer], las opciones de tratamiento y, si corresponde, la necesidad de Optometrist ms pruebas. Siga estas instrucciones en su casa: Comida y bebida   Siga una dieta que incluya frutas frescas y verduras, cereales integrales, lcteos descremados y protenas magras.  Tome los suplementos vitamnicos y [employer] se lo haya indicado el mdico.  No beba alcohol si: ? Su mdico le indica no hacerlo. ? Est embarazada, puede estar embarazada o est tratando de quedar embarazada.  Si bebe alcohol: ? Limite la cantidad que consume de 0 a 1 medida por da. ? Est atenta a la cantidad de alcohol que hay en las bebidas que toma. En los [place], una medida equivale a una botella de cerveza de 12oz (359m), un vaso de vino de 5oz (1417m o un vaso de una bebida alcohlica de alta graduacin de 1oz (4437m Estilo de vidNavistar International Corporationlas encas a diario.  Mantngase activa. Haga al  menos 57m4mos de ejercicio 5o ms das Hilton Hotelso consuma ningn producto que contenga nicotina o tabaco, como cigarrillos, cigarrillos electrnicos y tabaco de mascHigher education careers adviser necesita ayuda para dejar de fumar, consulte al mdico.  Si es sexualmente activa, practique sexo seguro. Use un condn u otra forma de mtodo anticonceptivo (anticonceptivos) a fin de evitEnvironmental health practitionerTS (infecciones de transmisin sexual). Si busca un embarazo, realice una consulta previa a la concepcin con el mdico. Cundo volver?  Visite al mdico una vez al ao para una visita de control.  Pregntele al mdico con qu frecuencia debe realizarse un control de la vista y los dientes.  Mantenga su esquema de vacunacin al da. Esta informacin no tiene comoMarine scientistconsejo del mdico. Asegrese de hacerle al mdico cualquier pregunta que tenga. Document Revised: 06/02/2018 Document Reviewed: 06/02/2018 Elsevier Patient Education  2020[place]

## 2020-04-25 LAB — CERVICOVAGINAL ANCILLARY ONLY
Bacterial Vaginitis (gardnerella): NEGATIVE
Candida Glabrata: NEGATIVE
Candida Vaginitis: NEGATIVE
Chlamydia: NEGATIVE
Comment: NEGATIVE
Comment: NEGATIVE
Comment: NEGATIVE
Comment: NEGATIVE
Comment: NEGATIVE
Comment: NORMAL
Neisseria Gonorrhea: NEGATIVE
Trichomonas: NEGATIVE

## 2020-04-25 LAB — CBC WITH DIFFERENTIAL/PLATELET
Basophils Absolute: 0.1 10*3/uL (ref 0.0–0.2)
Basos: 1 %
EOS (ABSOLUTE): 0.1 10*3/uL (ref 0.0–0.4)
Eos: 2 %
Hematocrit: 42.9 % (ref 34.0–46.6)
Hemoglobin: 14.1 g/dL (ref 11.1–15.9)
Immature Grans (Abs): 0 10*3/uL (ref 0.0–0.1)
Immature Granulocytes: 0 %
Lymphocytes Absolute: 2.2 10*3/uL (ref 0.7–3.1)
Lymphs: 45 %
MCH: 30.1 pg (ref 26.6–33.0)
MCHC: 32.9 g/dL (ref 31.5–35.7)
MCV: 92 fL (ref 79–97)
Monocytes Absolute: 0.3 10*3/uL (ref 0.1–0.9)
Monocytes: 7 %
Neutrophils Absolute: 2.2 10*3/uL (ref 1.4–7.0)
Neutrophils: 45 %
Platelets: 249 10*3/uL (ref 150–450)
RBC: 4.69 x10E6/uL (ref 3.77–5.28)
RDW: 11.9 % (ref 11.7–15.4)
WBC: 4.8 10*3/uL (ref 3.4–10.8)

## 2020-04-25 LAB — LIPID PANEL
Chol/HDL Ratio: 3.4 ratio (ref 0.0–4.4)
Cholesterol, Total: 200 mg/dL — ABNORMAL HIGH (ref 100–199)
HDL: 58 mg/dL (ref >39)
LDL Chol Calc (NIH): 125 mg/dL — ABNORMAL HIGH (ref 0–99)
Triglycerides: 96 mg/dL (ref 0–149)
VLDL Cholesterol Cal: 17 mg/dL (ref 5–40)

## 2020-04-25 LAB — CYTOLOGY - PAP: Diagnosis: NEGATIVE

## 2020-04-25 LAB — HIV ANTIBODY (ROUTINE TESTING W REFLEX): HIV Screen 4th Generation wRfx: NONREACTIVE

## 2020-04-25 LAB — HEPATITIS C ANTIBODY: Hep C Virus Ab: <0.1 s/co ratio (ref 0.0–0.9)

## 2020-05-14 ENCOUNTER — Encounter: Payer: Self-pay | Admitting: Critical Care Medicine

## 2020-05-14 ENCOUNTER — Ambulatory Visit: Payer: Self-pay | Attending: Critical Care Medicine | Admitting: Critical Care Medicine

## 2020-05-14 ENCOUNTER — Other Ambulatory Visit: Payer: Self-pay

## 2020-05-14 VITALS — BP 113/78 | HR 79 | Temp 97.5°F | Resp 18 | Ht 66.5 in | Wt 245.8 lb

## 2020-05-14 DIAGNOSIS — R0600 Dyspnea, unspecified: Secondary | ICD-10-CM | POA: Insufficient documentation

## 2020-05-14 DIAGNOSIS — R0609 Other forms of dyspnea: Secondary | ICD-10-CM

## 2020-05-14 DIAGNOSIS — Z23 Encounter for immunization: Secondary | ICD-10-CM

## 2020-05-14 DIAGNOSIS — N926 Irregular menstruation, unspecified: Secondary | ICD-10-CM

## 2020-05-14 LAB — POCT URINE PREGNANCY: Preg Test, Ur: NEGATIVE

## 2020-05-14 NOTE — Assessment & Plan Note (Signed)
This patient's dyspnea most likely relates to panic attacks and does not appear to be asthma related  She may well have a psychosomatic component to her asthma such that when she is having a panic episode she may have slight airway constriction  I do not believe further work-up is indicated  I will refer her to our licensed clinical social worker for anxiety evaluation  Note her pregnancy test today was negative she will follow back up with her primary regards to her regular.  She may need gynecology referral

## 2020-05-14 NOTE — Progress Notes (Signed)
Subjective:    Patient ID: Monica Peters, female    DOB: 1986-07-17, 34 y.o.   MRN: [id number]  05/14/2020 This 34 year old female is sent by her primary care physician to evaluate dyspnea.  The dyspnea is been going on for approximately 1 month.  She has had ongoing episodes of increased anxiety and panic attacks associated with the dyspnea.  She has no other routinely associated pulmonary complaints with the dyspnea.  She is not dyspneic on exertion.  There is no wheezing cough or chest tightness.  She has no real history of asthma that the primary care did give her albuterol she states it does help when she is having attacks.  The other issue is that her last menstrual period was over a month ago when she is late on this.  She does take birth control pills but missed a couple doses last month.  She has had 3 pregnancies and 2 miscarriages previously. Review shortness of breath assessment below  Shortness of Breath This is a new problem. The current episode started 1 to 4 weeks ago. The problem occurs intermittently. The problem has been resolved. Pertinent negatives include no chest pain, ear pain, leg swelling, rhinorrhea or wheezing. The symptoms are aggravated by emotional upset. She has tried nothing for the symptoms. Her past medical history is significant for allergies, asthma, pneumonia and a recent surgery.    History reviewed. No pertinent past medical history.   History reviewed. No pertinent family history.   Social History   Socioeconomic History  . Marital status: Single    Spouse name: Not on file  . Number of children: Not on file  . Years of education: Not on file  . Highest education level: Not on file  Occupational History  . Not on file  Tobacco Use  . Smoking status: Never Smoker  . Smokeless tobacco: Never Used  Substance and Sexual Activity  . Alcohol use: Never  . Drug use: Never  . Sexual activity: Not Currently  Other Topics Concern  . Not on  file  Social History Narrative  . Not on file   Social Determinants of Health   Financial Resource Strain:   . Difficulty of Paying Living Expenses: Not on file  Food Insecurity:   . Worried About Programme researcher, broadcasting/film/video in the Last Year: Not on file  . Ran Out of Food in the Last Year: Not on file  Transportation Needs:   . Lack of Transportation (Medical): Not on file  . Lack of Transportation (Non-Medical): Not on file  Physical Activity:   . Days of Exercise per Week: Not on file  . Minutes of Exercise per Session: Not on file  Stress:   . Feeling of Stress : Not on file  Social Connections:   . Frequency of Communication with Friends and Family: Not on file  . Frequency of Social Gatherings with Friends and Family: Not on file  . Attends Religious Services: Not on file  . Active Member of Clubs or Organizations: Not on file  . Attends Banker Meetings: Not on file  . Marital Status: Not on file  Intimate Partner Violence:   . Fear of Current or Ex-Partner: Not on file  . Emotionally Abused: Not on file  . Physically Abused: Not on file  . Sexually Abused: Not on file     No Known Allergies   Outpatient Medications Prior to Visit  Medication Sig Dispense Refill  . albuterol (VENTOLIN HFA)  108 (90 Base) MCG/ACT inhaler Inhale 2 puffs into the lungs every 6 (six) hours as needed for wheezing or shortness of breath. 8 g 0  . gabapentin (NEURONTIN) 300 MG capsule Take 1 capsule (300 mg total) by mouth 3 (three) times daily. 90 capsule 1  . ibuprofen (ADVIL) 600 MG tablet Take 1 tablet (600 mg total) by mouth every 8 (eight) hours as needed for moderate pain. 90 tablet 1  . levonorgestrel-ethinyl estradiol (NORDETTE) 0.15-30 MG-MCG tablet Take 1 tablet by mouth daily.    Marland Kitchen omeprazole (PRILOSEC) 20 MG capsule Take 1 capsule (20 mg total) by mouth daily. 30 capsule 3   No facility-administered medications prior to visit.     Review of Systems  Constitutional:  Positive for fatigue.  HENT: Negative for ear discharge, ear pain, postnasal drip, rhinorrhea, sinus pressure, sinus pain, sneezing, tinnitus, trouble swallowing and voice change.   Eyes: Negative.   Respiratory: Positive for shortness of breath. Negative for cough, choking, chest tightness, wheezing and stridor.   Cardiovascular: Negative for chest pain, palpitations and leg swelling.  Gastrointestinal: Negative.   Genitourinary: Positive for vaginal discharge. Negative for difficulty urinating, dyspareunia, dysuria, enuresis, flank pain, pelvic pain and urgency.       Objective:   Physical Exam Vitals:   05/14/20 1036  BP: 113/78  Pulse: 79  Resp: 18  Temp: (!) 97.5 F (36.4 C)  TempSrc: Temporal  SpO2: 98%  Weight: 245 lb 12.8 oz (111.5 kg)  Height: 5' 6.5" (1.689 m)    Gen: Pleasant, well-nourished, in no distress,  normal affect  ENT: No lesions,  mouth clear,  oropharynx clear, no postnasal drip  Neck: No JVD, no TMG, no carotid bruits  Lungs: No use of accessory muscles, no dullness to percussion, clear without rales or rhonchi  Cardiovascular: RRR, heart sounds normal, no murmur or gallops, no peripheral edema  Abdomen: soft and NT, no HSM,  BS normal  Musculoskeletal: No deformities, no cyanosis or clubbing  Neuro: alert, non focal  Skin: Warm, no lesions or rashes         Assessment & Plan:  I personally reviewed all images and lab data in the [facility] system as well as any outside material available during this office visit and agree with the  radiology impressions.   Dyspnea This patient's dyspnea most likely relates to panic attacks and does not appear to be asthma related  She may well have a psychosomatic component to her asthma such that when she is having a panic episode she may have slight airway constriction  I do not believe further work-up is indicated  I will refer her to our licensed clinical social worker for anxiety evaluation  Note her  pregnancy test today was negative she will follow back up with her primary regards to her regular.  She may need gynecology referral   [place] was seen today for shortness of breath.  Diagnoses and all orders for this visit:  Missed period -     POCT urine pregnancy  Need for immunization against influenza -     Flu Vaccine QUAD 36+ mos IM  Other form of dyspnea

## 2020-05-14 NOTE — Patient Instructions (Signed)
An appointment with Jenel Lucks our social work counselor will be made for your anxiety  Use albuterol as needed.    You may just have mild asthma induced by your anxiety  A pregnancy test will be obtained  Keep your follow ups with Marcelino Duster  Return Dr Delford Field as needed

## 2020-05-14 NOTE — Progress Notes (Signed)
Pt reports had anxiety w/ SHOB x 3 wks ago, worried about missed period, reports dark brown spotting started this morning  Needs refill on birth control pills if appropriate   Will the flu vaccine today

## 2020-05-24 ENCOUNTER — Ambulatory Visit: Payer: Self-pay | Admitting: Pharmacist

## 2020-05-31 ENCOUNTER — Other Ambulatory Visit: Payer: Self-pay

## 2020-05-31 ENCOUNTER — Ambulatory Visit: Payer: Self-pay | Admitting: Pharmacist

## 2020-06-05 ENCOUNTER — Other Ambulatory Visit (INDEPENDENT_AMBULATORY_CARE_PROVIDER_SITE_OTHER): Payer: Self-pay | Admitting: Primary Care

## 2020-06-05 ENCOUNTER — Other Ambulatory Visit: Payer: Self-pay

## 2020-06-05 ENCOUNTER — Ambulatory Visit (INDEPENDENT_AMBULATORY_CARE_PROVIDER_SITE_OTHER): Payer: Self-pay | Admitting: Primary Care

## 2020-06-05 ENCOUNTER — Encounter (INDEPENDENT_AMBULATORY_CARE_PROVIDER_SITE_OTHER): Payer: Self-pay | Admitting: Primary Care

## 2020-06-05 VITALS — BP 121/80 | HR 88 | Temp 98.2°F | Ht 66.5 in | Wt 246.0 lb

## 2020-06-05 DIAGNOSIS — M545 Low back pain, unspecified: Secondary | ICD-10-CM

## 2020-06-05 DIAGNOSIS — Z308 Encounter for other contraceptive management: Secondary | ICD-10-CM

## 2020-06-05 DIAGNOSIS — Z760 Encounter for issue of repeat prescription: Secondary | ICD-10-CM

## 2020-06-05 DIAGNOSIS — N921 Excessive and frequent menstruation with irregular cycle: Secondary | ICD-10-CM

## 2020-06-05 MED ORDER — MEGESTROL ACETATE 40 MG PO TABS: 80.0000 mg | ORAL_TABLET | Freq: Two times a day (BID) | ORAL | 0 refills | Status: DC

## 2020-06-05 MED ORDER — LEVONORGESTREL-ETHINYL ESTRAD 0.15-30 MG-MCG PO TABS: 1.0000 | ORAL_TABLET | Freq: Every day | ORAL | 11 refills | Status: DC

## 2020-06-05 MED ORDER — IBUPROFEN 600 MG PO TABS: 600.0000 mg | ORAL_TABLET | Freq: Three times a day (TID) | ORAL | 1 refills | Status: DC | PRN

## 2020-06-05 NOTE — Patient Instructions (Signed)
Sangrado uterino anormal Abnormal Uterine Bleeding El sangrado uterino anormal sucede cuando tiene un sangrado anormal del tero. Esto incluye lo siguiente:  Prdidas de sangre o hemorragias entre los perodos menstruales.  Sangrado luego de mantener relaciones sexuales.  Sangrado ms abundante que lo normal.  Perodos que duran ms de lo normal.  Sangrado luego de la menopausia. El sangrado uterino anormal puede afectar a las mujeres que estn en diversas etapas de la vida, desde adolescentes, mujeres frtiles y mujeres embarazadas, hasta mujeres que han llegado a la menopausia. Las causas ms comunes de sangrado uterino anormal incluyen lo siguiente:  Embarazo.  Crecimiento de tejido (plipos).  Tumores no cancerosos (fibromas) en el tero.  Infeccin.  Cncer.  Desequilibrios hormonales. El mdico debe evaluar cualquier clase de sangrado anormal. Muchos casos son leves y simples de tratar, mientras que otros son ms graves. El tratamiento depender de la causa del sangrado. Siga estas indicaciones en su casa:  Controle su afeccin para ver si hay cambios.  No se haga duchas vaginales, no utilice tampones ni tenga relaciones sexuales si as se lo indic su mdico.  Cambie los apsitos con frecuencia.  Realcese los exmenes de rutina que incluyen exmenes plvicos y controles de cncer de cuello uterino.  Concurra a todas las visitas de control como se lo haya indicado el mdico. Esto es importante. Comunquese con un mdico si:  El sangrado dura ms de una semana.  Se siente mareada por momentos.  Siente nuseas o vomita. Solicite ayuda de inmediato si:  Se desmaya.  Sangrado abundante que implica cambiar el apsito cada hora.  Siente dolor abdominal.  Tiene fiebre.  Se siente dbil o presenta sudoracin.  Elimina cogulos de sangre grandes por la vagina. Resumen  El sangrado uterino anormal sucede cuando tiene un sangrado anormal del tero.  El mdico  debe evaluar cualquier clase de sangrado anormal. Muchos casos son leves y simples de tratar, mientras que otros son ms graves.  El tratamiento depender de la causa del sangrado. Esta informacin no tiene como fin reemplazar el consejo del mdico. Asegrese de hacerle al mdico cualquier pregunta que tenga. Document Revised: 03/08/2017 Document Reviewed: 03/08/2017 Elsevier Patient Education  2020 Elsevier Inc.  

## 2020-06-05 NOTE — Progress Notes (Signed)
Established Patient Office Visit  Subjective:  Patient ID: Monica Peters, female    DOB: 06-May-1986  Age: 34 y.o. MRN: [id number]  CC:  Chief Complaint  Patient presents with  . Menstrual Problem    bled for 3 weeks   . Back Pain    HPI [name] is a 34 year old Hispanic female that speaks fluent English presents for abnormal menstrual bleeding.  Her last cycle lasted 3 weeks she is currently on oral contraception for birth control and abnormal bleeding.  As a result of continued administration her lower back is now hurting.  No past medical history on file.  No past surgical history on file.  No family history on file.  Social History   Socioeconomic History  . Marital status: Single    Spouse name: Not on file  . Number of children: Not on file  . Years of education: Not on file  . Highest education level: Not on file  Occupational History  . Not on file  Tobacco Use  . Smoking status: Never Smoker  . Smokeless tobacco: Never Used  Substance and Sexual Activity  . Alcohol use: Never  . Drug use: Never  . Sexual activity: Not Currently  Other Topics Concern  . Not on file  Social History Narrative  . Not on file   Social Determinants of Health   Financial Resource Strain:   . Difficulty of Paying Living Expenses: Not on file  Food Insecurity:   . Worried About Charity fundraiser in the Last Year: Not on file  . Ran Out of Food in the Last Year: Not on file  Transportation Needs:   . Lack of Transportation (Medical): Not on file  . Lack of Transportation (Non-Medical): Not on file  Physical Activity:   . Days of Exercise per Week: Not on file  . Minutes of Exercise per Session: Not on file  Stress:   . Feeling of Stress : Not on file  Social Connections:   . Frequency of Communication with Friends and Family: Not on file  . Frequency of Social Gatherings with Friends and Family: Not on file  . Attends Religious Services:  Not on file  . Active Member of Clubs or Organizations: Not on file  . Attends Archivist Meetings: Not on file  . Marital Status: Not on file  Intimate Partner Violence:   . Fear of Current or Ex-Partner: Not on file  . Emotionally Abused: Not on file  . Physically Abused: Not on file  . Sexually Abused: Not on file    Outpatient Medications Prior to Visit  Medication Sig Dispense Refill  . albuterol (VENTOLIN HFA) 108 (90 Base) MCG/ACT inhaler Inhale 2 puffs into the lungs every 6 (six) hours as needed for wheezing or shortness of breath. 8 g 0  . ibuprofen (ADVIL) 600 MG tablet Take 1 tablet (600 mg total) by mouth every 8 (eight) hours as needed for moderate pain. 90 tablet 1  . levonorgestrel-ethinyl estradiol (NORDETTE) 0.15-30 MG-MCG tablet Take 1 tablet by mouth daily.    Marland Kitchen gabapentin (NEURONTIN) 300 MG capsule Take 1 capsule (300 mg total) by mouth 3 (three) times daily. (Patient not taking: Reported on 06/05/2020) 90 capsule 1  . omeprazole (PRILOSEC) 20 MG capsule Take 1 capsule (20 mg total) by mouth daily. (Patient not taking: Reported on 06/05/2020) 30 capsule 3   No facility-administered medications prior to visit.    No Known Allergies  ROS  Review of Systems  Genitourinary: Positive for menstrual problem.  Musculoskeletal: Positive for back pain.  All other systems reviewed and are negative.     Objective:    Physical Exam Vitals reviewed.  Constitutional:      Appearance: She is obese.  HENT:     Head: Normocephalic.     Nose: Nose normal.  Eyes:     Extraocular Movements: Extraocular movements intact.  Cardiovascular:     Rate and Rhythm: Normal rate and regular rhythm.  Pulmonary:     Effort: Pulmonary effort is normal.     Breath sounds: Normal breath sounds.  Abdominal:     General: Bowel sounds are normal. There is distension.     Palpations: Abdomen is soft.  Musculoskeletal:        General: Normal range of motion.     Cervical back:  Normal range of motion and neck supple.  Skin:    General: Skin is warm and dry.  Neurological:     Mental Status: She is alert and oriented to person, place, and time.  Psychiatric:        Mood and Affect: Mood normal.        Behavior: Behavior normal.        Thought Content: Thought content normal.        Judgment: Judgment normal.     BP 121/80 (BP Location: Right Arm, Patient Position: Sitting, Cuff Size: Large)   Pulse 88   Temp 98.2 F (36.8 C) (Temporal)   Ht 5' 6.5" (1.689 m)   Wt 246 lb (111.6 kg)   LMP 05/20/2020 (Exact Date)   SpO2 97%   BMI 39.11 kg/m  Wt Readings from Last 3 Encounters:  06/05/20 246 lb (111.6 kg)  05/14/20 245 lb 12.8 oz (111.5 kg)  04/24/20 242 lb 3.2 oz (109.9 kg)     There are no preventive care reminders to display for this patient.  There are no preventive care reminders to display for this patient.  No results found for: TSH Lab Results  Component Value Date   WBC 4.8 04/24/2020   HGB 14.1 04/24/2020   HCT 42.9 04/24/2020   MCV 92 04/24/2020   PLT 249 04/24/2020   No results found for: NA, K, CHLORIDE, CO2, GLUCOSE, BUN, CREATININE, BILITOT, ALKPHOS, AST, ALT, PROT, ALBUMIN, CALCIUM, ANIONGAP, EGFR, GFR Lab Results  Component Value Date   CHOL 200 (H) 04/24/2020   Lab Results  Component Value Date   HDL 58 04/24/2020   Lab Results  Component Value Date   LDLCALC 125 (H) 04/24/2020   Lab Results  Component Value Date   TRIG 96 04/24/2020   Lab Results  Component Value Date   CHOLHDL 3.4 04/24/2020   No results found for: HGBA1C    Assessment & Plan:  Chavon was seen today for menstrual problem and back pain.  Diagnoses and all orders for this visit:  Menorrhagia with irregular cycle Consulted with GYN for prolonged menstrual cycles recommended Megace and no contraindication with oral contraception -     megestrol (MEGACE) 40 MG tablet; Take 2 tablets (80 mg total) by mouth 2 (two) times daily. -      Ambulatory referral to Gynecology  Acute bilateral low back pain, unspecified whether sciatica present -     ibuprofen (ADVIL) 600 MG tablet; Take 1 tablet (600 mg total) by mouth every 8 (eight) hours as needed for moderate pain.  Encounter for other contraceptive management Continue/refill current oral contraception -  levonorgestrel-ethinyl estradiol (NORDETTE) 0.15-30 MG-MCG tablet; Take 1 tablet by mouth daily.  Medication refill -     -     levonorgestrel-ethinyl estradiol (NORDETTE) 0.15-30 MG-MCG tablet; Take 1 tablet by mouth daily. -     ibuprofen (ADVIL) 600 MG tablet; Take 1 tablet (600 mg total) by mouth every 8 (eight) hours as needed for moderate pain.    Follow-up: Return if symptoms worsen or fail to improve.    Kerin Perna, NP

## 2020-06-06 MED FILL — LEVONOR-ETH ESTRAD 0.15-0.0: 0.15-30 | 28 days supply | Qty: 28 | Fill #0

## 2020-06-06 MED FILL — MEGESTROL 40 MG TABLET: 40 | 30 days supply | Qty: 120 | Fill #0

## 2020-06-06 MED FILL — IBUPROFEN 600 MG TABLET: 600 | 30 days supply | Qty: 90 | Fill #0

## 2020-06-09 ENCOUNTER — Encounter (HOSPITAL_BASED_OUTPATIENT_CLINIC_OR_DEPARTMENT_OTHER): Payer: Self-pay | Admitting: Internal Medicine

## 2020-06-14 ENCOUNTER — Other Ambulatory Visit: Payer: Self-pay

## 2020-06-14 ENCOUNTER — Ambulatory Visit (HOSPITAL_BASED_OUTPATIENT_CLINIC_OR_DEPARTMENT_OTHER): Payer: Self-pay | Attending: Primary Care | Admitting: Internal Medicine

## 2020-06-14 DIAGNOSIS — R0683 Snoring: Secondary | ICD-10-CM | POA: Insufficient documentation

## 2020-06-14 DIAGNOSIS — G4733 Obstructive sleep apnea (adult) (pediatric): Secondary | ICD-10-CM

## 2020-06-22 NOTE — Procedures (Signed)
    Patient Name: Monica Peters, Monica Peters Date: 06/14/2020 Gender: Female D.O.B: Jun 07, 1986 Age (years): 34 Referring Provider: Grayce Sessions NP Height (inches): 67 Interpreting Physician: Jetty Duhamel MD, ABSM Weight (lbs): 244 RPSGT: Cherylann Parr BMI: 38 MRN: [id number] Neck Size: 15.00  CLINICAL INFORMATION Sleep Study Type: NPSG Indication for sleep study: Obesity, Snoring, Witnesses Apnea / Gasping During Sleep Epworth Sleepiness Score: 11  SLEEP STUDY TECHNIQUE As per the AASM Manual for the Scoring of Sleep and Associated Events v2.3 (April 2016) with a hypopnea requiring 4% desaturations.  The channels recorded and monitored were frontal, central and occipital EEG, electrooculogram (EOG), submentalis EMG (chin), nasal and oral airflow, thoracic and abdominal wall motion, anterior tibialis EMG, snore microphone, electrocardiogram, and pulse oximetry.  MEDICATIONS Medications self-administered by patient taken the night of the study : IBUPROFEN, MEGESTROL ACETATE  SLEEP ARCHITECTURE The study was initiated at 10:15:12 PM and ended at 4:31:57 AM.  Sleep onset time was 9.8 minutes and the sleep efficiency was 92.4%%. The total sleep time was 347.9 minutes.  Stage REM latency was 62.0 minutes.  The patient spent 1.1%% of the night in stage N1 sleep, 82.3%% in stage N2 sleep, 6.2%% in stage N3 and 10.4% in REM.  Alpha intrusion was absent.  Supine sleep was 50.06%.  RESPIRATORY PARAMETERS The overall apnea/hypopnea index (AHI) was 1.0 per hour. There were 1 total apneas, including 1 obstructive, 0 central and 0 mixed apneas. There were 5 hypopneas and 2 RERAs.  The AHI during Stage REM sleep was 3.3 per hour.  AHI while supine was 1.4 per hour.  The mean oxygen saturation was 96.9%. The minimum SpO2 during sleep was 91.0%.  moderate snoring was noted during this study.  CARDIAC DATA The 2 lead EKG demonstrated sinus rhythm. The mean heart rate was  64.0 beats per minute. Other EKG findings include: None.  LEG MOVEMENT DATA The total PLMS were 0 with a resulting PLMS index of 0.0. Associated arousal with leg movement index was 0.0 .  IMPRESSIONS - No significant obstructive sleep apnea occurred during this study (AHI = 1.0/h). - No significant central sleep apnea occurred during this study (CAI = 0.0/h). - The patient had minimal or no oxygen desaturation during the study (Min O2 = 91.0%) - The patient snored with moderate snoring volume. - No cardiac abnormalities were noted during this study. - Clinically significant periodic limb movements did not occur during sleep. No significant associated arousals.  DIAGNOSIS - Normal study  RECOMMENDATIONS - Patient slept through the night with no sleep medication reported. Compare to reported sleep habits and home sleep environment. - Sleep hygiene should be reviewed to assess factors that may improve sleep quality. - Weight management and regular exercise should be initiated or continued if appropriate.  [Electronically signed] 06/22/2020 11:15 AM  Jetty Duhamel MD, ABSM Diplomate, American Board of Sleep Medicine   NPI: [id number]                        Jetty Duhamel Diplomate, American Board of Sleep Medicine  ELECTRONICALLY SIGNED ON:  06/22/2020, 11:13 AM [facility] PH: [phone]   FX: [phone] ACCREDITED BY THE AMERICAN ACADEMY OF SLEEP MEDICINE

## 2020-06-23 DIAGNOSIS — G4733 Obstructive sleep apnea (adult) (pediatric): Secondary | ICD-10-CM

## 2020-06-25 ENCOUNTER — Telehealth (INDEPENDENT_AMBULATORY_CARE_PROVIDER_SITE_OTHER): Payer: Self-pay

## 2020-06-25 NOTE — Telephone Encounter (Signed)
Call placed to patient using pacific interpreter [phone]) patient is aware that sleep study was normal; no sleep apnea. She verbalized understanding. Maryjean Morn, CMA

## 2020-07-02 ENCOUNTER — Other Ambulatory Visit: Payer: Self-pay

## 2020-07-02 ENCOUNTER — Ambulatory Visit: Payer: Self-pay | Attending: Primary Care

## 2020-07-11 ENCOUNTER — Other Ambulatory Visit: Payer: Self-pay

## 2020-07-11 ENCOUNTER — Other Ambulatory Visit (INDEPENDENT_AMBULATORY_CARE_PROVIDER_SITE_OTHER): Payer: Self-pay | Admitting: Primary Care

## 2020-07-11 ENCOUNTER — Encounter (INDEPENDENT_AMBULATORY_CARE_PROVIDER_SITE_OTHER): Payer: Self-pay | Admitting: Primary Care

## 2020-07-11 ENCOUNTER — Telehealth (INDEPENDENT_AMBULATORY_CARE_PROVIDER_SITE_OTHER): Payer: Self-pay | Admitting: Primary Care

## 2020-07-11 DIAGNOSIS — N939 Abnormal uterine and vaginal bleeding, unspecified: Secondary | ICD-10-CM

## 2020-07-11 DIAGNOSIS — G629 Polyneuropathy, unspecified: Secondary | ICD-10-CM

## 2020-07-11 MED ORDER — GABAPENTIN 300 MG PO CAPS: 300.0000 mg | ORAL_CAPSULE | Freq: Three times a day (TID) | ORAL | 3 refills | Status: DC

## 2020-07-11 MED FILL — GABAPENTIN 300 MG CAPSULE: 300 | 30 days supply | Qty: 90 | Fill #0

## 2020-07-11 NOTE — Progress Notes (Signed)
Telephone Note  I connected with Monica Peters on 07/11/20 at 10:30 AM EST by telephone and verified that I am speaking with the correct person using two identifiers.  Location: Patient: home  Provider: Kizzie Cotten P [email or web address]    I discussed the limitations, risks, security and privacy concerns of performing an evaluation and management service by telephone and the availability of in person appointments. I also discussed with the patient that there may be a patient responsible charge related to this service. The patient expressed understanding and agreed to proceed.   History of Present Illness: Monica Peters is a 30  Hispanic females speaks Spanish only interpreter Camilo [phone]. Pain has been gradually getting worst. She has been taking a lot of ibuprofen due to pain in hands. Numbness and tingling in her hands . AUB improving but continues.    No past medical history on file.  Current Outpatient Medications on File Prior to Visit  Medication Sig Dispense Refill  . albuterol (VENTOLIN HFA) 108 (90 Base) MCG/ACT inhaler Inhale 2 puffs into the lungs every 6 (six) hours as needed for wheezing or shortness of breath. 8 g 0  . ibuprofen (ADVIL) 600 MG tablet Take 1 tablet (600 mg total) by mouth every 8 (eight) hours as needed for moderate pain. 90 tablet 1  . levonorgestrel-ethinyl estradiol (NORDETTE) 0.15-30 MG-MCG tablet Take 1 tablet by mouth daily. 28 tablet 11  . megestrol (MEGACE) 40 MG tablet Take 2 tablets (80 mg total) by mouth 2 (two) times daily. 120 tablet 0  . gabapentin (NEURONTIN) 300 MG capsule Take 1 capsule (300 mg total) by mouth 3 (three) times daily. (Patient not taking: Reported on 06/05/2020) 90 capsule 1  . omeprazole (PRILOSEC) 20 MG capsule Take 1 capsule (20 mg total) by mouth daily. (Patient not taking: Reported on 06/05/2020) 30 capsule 3   No current facility-administered medications on file prior to visit.    Observations/Objective: Review of Systems  Genitourinary:       ABU  Musculoskeletal:       Pain in hands   Neurological: Positive for tingling.  All other systems reviewed and are negative.   Assessment and Plan: Monica Peters was seen today for medication refill.  Diagnoses and all orders for this visit:  Abnormal uterine bleeding (AUB) Treated with megace which is a temporary management she will be referred to gyn   Neuropathy -     gabapentin (NEURONTIN) 300 MG capsule; Take 1 capsule (300 mg total) by mouth 3 (three) times daily.    Follow Up Instructions:    I discussed the assessment and treatment plan with the patient. The patient was provided an opportunity to ask questions and all were answered. The patient agreed with the plan and demonstrated an understanding of the instructions.   The patient was advised to call back or seek an in-person evaluation if the symptoms worsen or if the condition fails to improve as anticipated.  I provided 15 minutes of non-face-to-face time during this encounter.   Jenne Campus, NP

## 2020-07-11 NOTE — Progress Notes (Signed)
She has been taking a lot of ibuprofen due to pain in hands Pain has increased in the last few days   [employer] [phone]

## 2020-07-12 ENCOUNTER — Telehealth: Payer: Self-pay | Admitting: Primary Care

## 2020-07-12 MED FILL — ?IBUPROFEN 600 MG TABLETS: 600 | 30 days supply | Qty: 90 | Fill #1

## 2020-07-12 MED FILL — LEVONOR-ETH ESTRAD 0.15-0.0: 0.15-30 | 28 days supply | Qty: 28 | Fill #1

## 2020-07-12 NOTE — Telephone Encounter (Signed)
Pt was call to inform the email we received from [employer], Account is currently pending with [employer] and is not currently eligible for cafa review at this time. Pt will contact [employer] to go thru the process

## 2020-07-31 ENCOUNTER — Telehealth: Payer: Self-pay | Admitting: Primary Care

## 2020-07-31 NOTE — Telephone Encounter (Signed)
Copied from CRM [id number]. Topic: General - Other >> Jul 31, 2020  3:30 PM Wyonia Hough E wrote: Reason for CRM: Pt would like to speak with Mikle Bosworth about her application/ please advise

## 2020-08-01 NOTE — Telephone Encounter (Signed)
I return Pt call, She was inform that she is approve for CAFA and I will be sending a copy of the letter by mail

## 2020-08-08 ENCOUNTER — Ambulatory Visit (INDEPENDENT_AMBULATORY_CARE_PROVIDER_SITE_OTHER): Payer: Self-pay

## 2020-08-08 NOTE — Telephone Encounter (Signed)
Patient called and says she's been having a fever for the past 2 days. She says she hasn't checked it, but she has chills and feels hot. She says she's been taking Ibuprofen. She says she also has abdominal cramping and lower back pain. She denies any other symptoms. I called the office and spoke to [place], [facility] who advised first available with PCP is on 08/19/20, but there is availability with Marguerita Merles, PA-C at [facility] on tomorrow at 1010. She scheduled the patient a virtual appointment. Patient advised, care advice given, patient verbalized understanding.  Reason for Disposition . Fever present > 3 days (72 hours)  Answer Assessment - Initial Assessment Questions 1. TEMPERATURE: "What is the most recent temperature?"  "How was it measured?"      No thermometer to use at present, just having chills 2. ONSET: "When did the fever start?"      2 days 3. SYMPTOMS: "Do you have any other symptoms besides the fever?"  (e.g., colds, headache, sore throat, earache, cough, rash, diarrhea, vomiting, abdominal pain)     Abdominal cramping, lower back pain acroos 4. CAUSE: If there are no symptoms, ask: "What do you think is causing the fever?"      No idea 5. CONTACTS: "Does anyone else in the family have an infection?"     No 6. TREATMENT: "What have you done so far to treat this fever?" (e.g., medications)     Ibuprofen 7. IMMUNOCOMPROMISE: "Do you have of the following: diabetes, HIV positive, splenectomy, cancer chemotherapy, chronic steroid treatment, transplant patient, etc."     No 8. PREGNANCY: "Is there any chance you are pregnant?" "When was your last menstrual period?"     No 9. TRAVEL: "Have you traveled out of the country in the last month?" (e.g., travel history, exposures)     No  Protocols used: FEVER-A-AH

## 2020-08-09 ENCOUNTER — Ambulatory Visit: Payer: Self-pay | Attending: Physician Assistant | Admitting: Physician Assistant

## 2020-08-09 ENCOUNTER — Other Ambulatory Visit: Payer: Self-pay | Admitting: Physician Assistant

## 2020-08-09 ENCOUNTER — Encounter: Payer: Self-pay | Admitting: Physician Assistant

## 2020-08-09 ENCOUNTER — Other Ambulatory Visit: Payer: Self-pay

## 2020-08-09 VITALS — Temp 98.8°F

## 2020-08-09 DIAGNOSIS — J069 Acute upper respiratory infection, unspecified: Secondary | ICD-10-CM

## 2020-08-09 DIAGNOSIS — R110 Nausea: Secondary | ICD-10-CM

## 2020-08-09 MED ORDER — ONDANSETRON 8 MG PO TBDP: 8.0000 mg | ORAL_TABLET | Freq: Three times a day (TID) | ORAL | 0 refills | Status: DC | PRN

## 2020-08-09 MED FILL — ONDANSETRON ODT 8 MG TABLET: 8 | 6 days supply | Qty: 20 | Fill #0

## 2020-08-09 NOTE — Patient Instructions (Addendum)
Not active on myChart

## 2020-08-09 NOTE — Progress Notes (Signed)
Medical Assistant used [employer] to contact patient.  Interpreter Name: Armida Sans Interpreter #: [phone] Patient verified DOB Patient has taken ibuprofen 600mg  for fever this morning.  Patient has eaten today. Patients reports fever as 101.3 last night. Patient complains of stomachache-cramps- with nausea. Patient complains of diarrhea with no vomiting. Patient complains of body aches. Patient has been vaccinated. Patients daughter has an ear infection with no other concerns. Patient denies any exposure to any outside illnesses.

## 2020-08-09 NOTE — Progress Notes (Signed)
Established Patient Office Visit  Subjective:  Patient ID: Monica Peters, female    DOB: 04-Feb-1986  Age: 34 y.o. MRN: [id number]  CC:  Chief Complaint  Patient presents with  . Fever   Virtual Visit via Telephone Note  I connected with Gibson Ramp on 08/11/20 at 10:10 AM EST by telephone and verified that I am speaking with the correct person using two identifiers.  Location: Patient: Home Provider: [facility]   I discussed the limitations, risks, security and privacy concerns of performing an evaluation and management service by telephone and the availability of in person appointments. I also discussed with the patient that there may be a patient responsible charge related to this service. The patient expressed understanding and agreed to proceed.   History of Present Illness:   Reports that she has been having stomach cramps, nausea, diarrhea, body aches, and fever for the past 3 days,  states reported fever of 101.3 last night.  Reports she has been taking ibuprofen 600 with some relief.  Denies cough, sore throat or SOB   States that she is able to drink but is having nausea with any type of food.  Reports daughter has been ill with an ear infection, otherwise no sick contacts. States that daughter was tested for Covid and was negative.  Reports [employer] Covid vaccine in June 2021   Due to language barrier, an interpreter was present during the history-taking and subsequent discussion (and for part of the physical exam) with this patient.  Observations/Objective: Medical history and current medications reviewed, no physical exam completed  History reviewed. No pertinent past medical history.  History reviewed. No pertinent surgical history.  History reviewed. No pertinent family history.  Social History   Socioeconomic History  . Marital status: Single    Spouse name: Not on file  . Number of children: Not on file  . Years of  education: Not on file  . Highest education level: Not on file  Occupational History  . Not on file  Tobacco Use  . Smoking status: Never Smoker  . Smokeless tobacco: Never Used  Substance and Sexual Activity  . Alcohol use: Never  . Drug use: Never  . Sexual activity: Yes  Other Topics Concern  . Not on file  Social History Narrative  . Not on file   Social Determinants of Health   Financial Resource Strain: Not on file  Food Insecurity: Not on file  Transportation Needs: Not on file  Physical Activity: Not on file  Stress: Not on file  Social Connections: Not on file  Intimate Partner Violence: Not on file    Outpatient Medications Prior to Visit  Medication Sig Dispense Refill  . albuterol (VENTOLIN HFA) 108 (90 Base) MCG/ACT inhaler Inhale 2 puffs into the lungs every 6 (six) hours as needed for wheezing or shortness of breath. 8 g 0  . gabapentin (NEURONTIN) 300 MG capsule Take 1 capsule (300 mg total) by mouth 3 (three) times daily. 90 capsule 3  . ibuprofen (ADVIL) 600 MG tablet Take 1 tablet (600 mg total) by mouth every 8 (eight) hours as needed for moderate pain. 90 tablet 1  . levonorgestrel-ethinyl estradiol (NORDETTE) 0.15-30 MG-MCG tablet Take 1 tablet by mouth daily. 28 tablet 11  . megestrol (MEGACE) 40 MG tablet Take 2 tablets (80 mg total) by mouth 2 (two) times daily. 120 tablet 0  . omeprazole (PRILOSEC) 20 MG capsule Take 1 capsule (20 mg total) by mouth daily.  30 capsule 3   No facility-administered medications prior to visit.    No Known Allergies  ROS Review of Systems  Constitutional: Positive for fatigue and fever.  HENT: Negative.   Eyes: Negative.   Respiratory: Negative for shortness of breath and wheezing.   Cardiovascular: Negative for chest pain and palpitations.  Gastrointestinal: Positive for nausea and vomiting. Negative for abdominal pain.  Endocrine: Negative.   Genitourinary: Negative.   Musculoskeletal: Positive for myalgias.   Skin: Negative.   Allergic/Immunologic: Negative.   Neurological: Positive for headaches.  Hematological: Negative.   Psychiatric/Behavioral: Negative.       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 COVID-19 Vaccine (2 - Booster for Janssen series) 04/05/2020    There are no preventive care reminders to display for this patient.  No results found for: TSH Lab Results  Component Value Date   WBC 4.8 04/24/2020   HGB 14.1 04/24/2020   HCT 42.9 04/24/2020   MCV 92 04/24/2020   PLT 249 04/24/2020   No results found for: NA, K, CHLORIDE, CO2, GLUCOSE, BUN, CREATININE, BILITOT, ALKPHOS, AST, ALT, PROT, ALBUMIN, CALCIUM, ANIONGAP, EGFR, GFR Lab Results  Component Value Date   CHOL 200 (H) 04/24/2020   Lab Results  Component Value Date   HDL 58 04/24/2020   Lab Results  Component Value Date   LDLCALC 125 (H) 04/24/2020   Lab Results  Component Value Date   TRIG 96 04/24/2020   Lab Results  Component Value Date   CHOLHDL 3.4 04/24/2020   No results found for: HGBA1C    Assessment & Plan:   Problem List Items Addressed This Visit      Respiratory   Viral upper respiratory tract infection - Primary     Other   Nausea   Relevant Medications   ondansetron (ZOFRAN ODT) 8 MG disintegrating tablet     Assessment and Plan:  1. Viral upper respiratory tract infection Trial of Zofran, encourage patient to continue symptomatic treatment over-the-counter, increase hydration, patient strongly encouraged to be tested for COVID-19 infection, encourage patient to self isolate until test result returns.  Patient understands and agrees  2. Nausea  - ondansetron (ZOFRAN ODT) 8 MG disintegrating tablet; Take 1 tablet  (8 mg total) by mouth every 8 (eight) hours as needed for nausea or vomiting.  Dispense: 20 tablet; Refill: 0   Follow Up Instructions:    I discussed the assessment and treatment plan with the patient. The patient was provided an opportunity to ask questions and all were answered. The patient agreed with the plan and demonstrated an understanding of the instructions.   The patient was advised to call back or seek an in-person evaluation if the symptoms worsen or if the condition fails to improve as anticipated.  I provided 21 minutes of non-face-to-face time during this encounter.   Raney Antwine S Mayers, PA-C      Meds ordered this encounter  Medications  . ondansetron (ZOFRAN ODT) 8 MG disintegrating tablet    Sig: Take 1 tablet (8 mg total) by mouth every 8 (eight) hours as needed for nausea or vomiting.    Dispense:  20 tablet  Refill:  0    Order Specific Question:   Supervising Provider    Answer:   Elsie Stain [[id number]]    Follow-up: Return if symptoms worsen or fail to improve.    Loraine Grip Mayers, PA-C

## 2020-08-11 DIAGNOSIS — R110 Nausea: Secondary | ICD-10-CM | POA: Insufficient documentation

## 2020-08-11 DIAGNOSIS — J069 Acute upper respiratory infection, unspecified: Secondary | ICD-10-CM | POA: Insufficient documentation

## 2020-08-13 ENCOUNTER — Telehealth: Payer: Self-pay | Admitting: Primary Care

## 2020-08-13 NOTE — Telephone Encounter (Signed)
Called patient using interpreter and LVM letting patient know her appointment with Cari for 12/16 had been cancelled and needed to be rescheduled with another provider. Patient can also go to mobile medicine unit to see Cari on 12/15 at [address] [address] or 12/16 at [address] [address].

## 2020-08-15 ENCOUNTER — Telehealth: Payer: Self-pay | Admitting: Physician Assistant

## 2020-09-10 ENCOUNTER — Other Ambulatory Visit (INDEPENDENT_AMBULATORY_CARE_PROVIDER_SITE_OTHER): Payer: Self-pay | Admitting: Primary Care

## 2020-09-10 ENCOUNTER — Ambulatory Visit (INDEPENDENT_AMBULATORY_CARE_PROVIDER_SITE_OTHER): Payer: Self-pay | Admitting: Primary Care

## 2020-09-10 ENCOUNTER — Other Ambulatory Visit: Payer: Self-pay

## 2020-09-10 ENCOUNTER — Encounter (INDEPENDENT_AMBULATORY_CARE_PROVIDER_SITE_OTHER): Payer: Self-pay | Admitting: Primary Care

## 2020-09-10 VITALS — BP 127/88 | HR 80 | Temp 97.3°F | Ht 66.5 in | Wt 244.6 lb

## 2020-09-10 DIAGNOSIS — Z13220 Encounter for screening for lipoid disorders: Secondary | ICD-10-CM

## 2020-09-10 DIAGNOSIS — R110 Nausea: Secondary | ICD-10-CM

## 2020-09-10 DIAGNOSIS — N939 Abnormal uterine and vaginal bleeding, unspecified: Secondary | ICD-10-CM

## 2020-09-10 MED ORDER — ONDANSETRON 4 MG PO TBDP: 4.0000 mg | ORAL_TABLET | Freq: Three times a day (TID) | ORAL | 0 refills | Status: DC | PRN

## 2020-09-10 MED FILL — ONDANSETRON ODT 4 MG TABLET: 4 | 7 days supply | Qty: 20 | Fill #0

## 2020-09-10 NOTE — Progress Notes (Signed)
Established Patient Office Visit  Subjective:  Patient ID: Monica Peters, female    DOB: 1986-02-02  Age: 35 y.o. MRN: [id number]  CC:  Chief Complaint  Patient presents with  . Follow-up    Abnormal uterine bleeding     HPI Ms. Gibson Ramp presents for for abnormal urine bleeding with nausea. Menstrual cycle have no pattern , spotting, cycle, spot than another cycle in the same month. Explain this is not menopause .Notice some improvement with megace. No past medical history on file.  No past surgical history on file.  No family history on file.  Social History   Socioeconomic History  . Marital status: Single    Spouse name: Not on file  . Number of children: Not on file  . Years of education: Not on file  . Highest education level: Not on file  Occupational History  . Not on file  Tobacco Use  . Smoking status: Never Smoker  . Smokeless tobacco: Never Used  Substance and Sexual Activity  . Alcohol use: Never  . Drug use: Never  . Sexual activity: Yes  Other Topics Concern  . Not on file  Social History Narrative  . Not on file   Social Determinants of Health   Financial Resource Strain: Not on file  Food Insecurity: Not on file  Transportation Needs: Not on file  Physical Activity: Not on file  Stress: Not on file  Social Connections: Not on file  Intimate Partner Violence: Not on file    Outpatient Medications Prior to Visit  Medication Sig Dispense Refill  . albuterol (VENTOLIN HFA) 108 (90 Base) MCG/ACT inhaler Inhale 2 puffs into the lungs every 6 (six) hours as needed for wheezing or shortness of breath. 8 g 0  . gabapentin (NEURONTIN) 300 MG capsule Take 1 capsule (300 mg total) by mouth 3 (three) times daily. 90 capsule 3  . ibuprofen (ADVIL) 600 MG tablet Take 1 tablet (600 mg total) by mouth every 8 (eight) hours as needed for moderate pain. 90 tablet 1  . levonorgestrel-ethinyl estradiol (NORDETTE) 0.15-30 MG-MCG tablet  Take 1 tablet by mouth daily. 28 tablet 11  . omeprazole (PRILOSEC) 20 MG capsule Take 1 capsule (20 mg total) by mouth daily. 30 capsule 3  . megestrol (MEGACE) 40 MG tablet Take 2 tablets (80 mg total) by mouth 2 (two) times daily. (Patient not taking: Reported on 09/10/2020) 120 tablet 0  . ondansetron (ZOFRAN ODT) 8 MG disintegrating tablet Take 1 tablet (8 mg total) by mouth every 8 (eight) hours as needed for nausea or vomiting. 20 tablet 0   No facility-administered medications prior to visit.    No Known Allergies  ROS Review of Systems  Genitourinary: Positive for menstrual problem.  All other systems reviewed and are negative.     Objective:    Physical Exam Vitals reviewed.  Constitutional:      Appearance: She is obese.  HENT:     Right Ear: External ear normal.     Left Ear: External ear normal.     Nose: Nose normal.  Eyes:     Extraocular Movements: Extraocular movements intact.  Cardiovascular:     Rate and Rhythm: Normal rate and regular rhythm.  Pulmonary:     Effort: Pulmonary effort is normal.     Breath sounds: Normal breath sounds.  Musculoskeletal:        General: Normal range of motion.     Cervical back: Normal range of motion  and neck supple.  Skin:    General: Skin is warm and dry.  Neurological:     Mental Status: She is alert and oriented to person, place, and time.  Psychiatric:        Mood and Affect: Mood normal.        Behavior: Behavior normal.        Thought Content: Thought content normal.        Judgment: Judgment normal.     BP 127/88 (BP Location: Right Arm, Patient Position: Sitting, Cuff Size: Large)   Pulse 80   Temp (!) 97.3 F (36.3 C) (Temporal)   Ht 5' 6.5" (1.689 m)   Wt 244 lb 9.6 oz (110.9 kg)   LMP 08/25/2020 (Exact Date)   SpO2 97%   BMI 38.89 kg/m  Wt Readings from Last 3 Encounters:  09/10/20 244 lb 9.6 oz (110.9 kg)  06/14/20 244 lb (110.7 kg)  06/05/20 246 lb (111.6 kg)     Health Maintenance Due   Topic Date Due  . COVID-19 Vaccine (2 - Booster for Janssen series) 04/05/2020    There are no preventive care reminders to display for this patient.  No results found for: TSH Lab Results  Component Value Date   WBC 5.1 09/10/2020   HGB 13.8 09/10/2020   HCT 40.5 09/10/2020   MCV 90 09/10/2020   PLT 291 09/10/2020   Lab Results  Component Value Date   NA 139 09/10/2020   K 3.9 09/10/2020   CO2 21 09/10/2020   GLUCOSE 85 09/10/2020   BUN 9 09/10/2020   CREATININE 0.59 09/10/2020   BILITOT 0.5 09/10/2020   ALKPHOS 145 (H) 09/10/2020   AST 64 (H) 09/10/2020   ALT 132 (H) 09/10/2020   PROT 7.0 09/10/2020   ALBUMIN 4.6 09/10/2020   CALCIUM 10.4 (H) 09/10/2020   Lab Results  Component Value Date   CHOL 193 09/10/2020   Lab Results  Component Value Date   HDL 54 09/10/2020   Lab Results  Component Value Date   LDLCALC 124 (H) 09/10/2020   Lab Results  Component Value Date   TRIG 82 09/10/2020   Lab Results  Component Value Date   CHOLHDL 3.6 09/10/2020   No results found for: HGBA1C    Assessment & Plan:  Nikitha was seen today for follow-up.  Diagnoses and all orders for this visit:  Lipid screening Hx of elevated chol and LDL . Encourage Your LDL is not in range. Encourage to  decrease your fatty foods, red meat, cheese, milk and increase fiber like whole grains and veggies. You can also add a fiber supplement like Metamucil or Benefiber.  -     Lipid Panel  Nausea Unknown etiology , not able to identify any variables for causative factors. Asked to monitor when she is nauseated what she was doing or eating or did you have a headache. Check labs and tx with antiemetic  -     CMP14+EGFR -     CBC with Differential -     ondansetron (ZOFRAN ODT) 4 MG disintegrating tablet; Take 1 tablet (4 mg total) by mouth every 8 (eight) hours as needed for nausea or vomiting.  Abnormal uterine bleeding (AUB) Some improvement with megace -side affect is increase  appetite . Would like to consider an alternate you can take Naproxen (Aleve) 500 mg at onset of menses and 3-5 hours later; then 250 mg to 500 mg twice daily OR you can take Ibuprofen (Motrin)  600 mg every 6 hrs starting 2-3 days prior to onset of menses.    Follow-up: Return if symptoms worsen or fail to improve.    Kerin Perna, NP

## 2020-09-11 LAB — CBC WITH DIFFERENTIAL/PLATELET
Basophils Absolute: 0 10*3/uL (ref 0.0–0.2)
Basos: 1 %
EOS (ABSOLUTE): 0.1 10*3/uL (ref 0.0–0.4)
Eos: 3 %
Hematocrit: 40.5 % (ref 34.0–46.6)
Hemoglobin: 13.8 g/dL (ref 11.1–15.9)
Immature Grans (Abs): 0 10*3/uL (ref 0.0–0.1)
Immature Granulocytes: 0 %
Lymphocytes Absolute: 2.3 10*3/uL (ref 0.7–3.1)
Lymphs: 45 %
MCH: 30.6 pg (ref 26.6–33.0)
MCHC: 34.1 g/dL (ref 31.5–35.7)
MCV: 90 fL (ref 79–97)
Monocytes Absolute: 0.4 10*3/uL (ref 0.1–0.9)
Monocytes: 8 %
Neutrophils Absolute: 2.2 10*3/uL (ref 1.4–7.0)
Neutrophils: 43 %
Platelets: 291 10*3/uL (ref 150–450)
RBC: 4.51 x10E6/uL (ref 3.77–5.28)
RDW: 12.3 % (ref 11.7–15.4)
WBC: 5.1 10*3/uL (ref 3.4–10.8)

## 2020-09-11 LAB — CMP14+EGFR
ALT: 132 IU/L — ABNORMAL HIGH (ref 0–32)
AST: 64 IU/L — ABNORMAL HIGH (ref 0–40)
Albumin/Globulin Ratio: 1.9 (ref 1.2–2.2)
Albumin: 4.6 g/dL (ref 3.8–4.8)
Alkaline Phosphatase: 145 IU/L — ABNORMAL HIGH (ref 44–121)
BUN/Creatinine Ratio: 15 (ref 9–23)
BUN: 9 mg/dL (ref 6–20)
Bilirubin Total: 0.5 mg/dL (ref 0.0–1.2)
CO2: 21 mmol/L (ref 20–29)
Calcium: 10.4 mg/dL — ABNORMAL HIGH (ref 8.7–10.2)
Chloride: 104 mmol/L (ref 96–106)
Creatinine, Ser: 0.59 mg/dL (ref 0.57–1.00)
GFR calc Af Amer: 135 mL/min/{1.73_m2} (ref >59)
GFR calc non Af Amer: 117 mL/min/{1.73_m2} (ref >59)
Globulin, Total: 2.4 g/dL (ref 1.5–4.5)
Glucose: 85 mg/dL (ref 65–99)
Potassium: 3.9 mmol/L (ref 3.5–5.2)
Sodium: 139 mmol/L (ref 134–144)
Total Protein: 7 g/dL (ref 6.0–8.5)

## 2020-09-11 LAB — LIPID PANEL
Chol/HDL Ratio: 3.6 ratio (ref 0.0–4.4)
Cholesterol, Total: 193 mg/dL (ref 100–199)
HDL: 54 mg/dL (ref >39)
LDL Chol Calc (NIH): 124 mg/dL — ABNORMAL HIGH (ref 0–99)
Triglycerides: 82 mg/dL (ref 0–149)
VLDL Cholesterol Cal: 15 mg/dL (ref 5–40)

## 2020-09-18 ENCOUNTER — Telehealth (INDEPENDENT_AMBULATORY_CARE_PROVIDER_SITE_OTHER): Payer: Self-pay

## 2020-09-18 NOTE — Telephone Encounter (Signed)
Call placed to patient with assistance of [employer] interpreter [phone]) patient verified date of birth. She is aware that liver enzymes are elevated. This could be due to alcohol consumption, immune deficiency or viral issues. Will monitor; no treatment needed at this time. She verbalized understanding. Monica Peters, CMA

## 2020-09-19 DIAGNOSIS — N939 Abnormal uterine and vaginal bleeding, unspecified: Secondary | ICD-10-CM | POA: Insufficient documentation

## 2020-10-10 ENCOUNTER — Other Ambulatory Visit: Payer: Self-pay

## 2020-10-10 ENCOUNTER — Encounter: Payer: Self-pay | Admitting: Certified Nurse Midwife

## 2020-10-10 ENCOUNTER — Ambulatory Visit (INDEPENDENT_AMBULATORY_CARE_PROVIDER_SITE_OTHER): Payer: Self-pay | Admitting: Certified Nurse Midwife

## 2020-10-10 VITALS — BP 146/83 | HR 94 | Wt 238.4 lb

## 2020-10-10 DIAGNOSIS — N939 Abnormal uterine and vaginal bleeding, unspecified: Secondary | ICD-10-CM

## 2020-10-10 NOTE — Progress Notes (Signed)
History:  Monica Peters is a 35 y.o. [id number] who presents to clinic today for ongoing abnormal uterine bleeding. She has been seen before for this issue and prescribed Megace (which helped temporarily) and COC which has lessened the bleeding but she is still getting a full menstrual cycle q2wks. She has started to have cramping with these cycles and now has new onset low back pain (she will be seeing her PCP for this issue).   Refused interpretation services.   The following portions of the patient's history were reviewed and updated as appropriate: allergies, current medications, family history, past medical history, social history, past surgical history and problem list.  Review of Systems:  Review of Systems  Constitutional: Negative for chills and fever.  HENT: Negative for congestion.   Respiratory: Negative for shortness of breath.   Cardiovascular: Negative for chest pain.  Gastrointestinal: Positive for abdominal pain.  Musculoskeletal: Positive for back pain.  All other systems reviewed and are negative.  Objective:  Physical Exam BP (!) 146/83   Pulse 94   Wt 238 lb 6.4 oz (108.1 kg)   LMP 10/06/2020 (Exact Date)   BMI 37.90 kg/m  Physical Exam Vitals and nursing note reviewed.  Constitutional:      General: She is not in acute distress.    Appearance: Normal appearance. She is not ill-appearing.  Eyes:     Pupils: Pupils are equal, round, and reactive to light.  Cardiovascular:     Rate and Rhythm: Normal rate.     Pulses: Normal pulses.  Pulmonary:     Effort: Pulmonary effort is normal.  Musculoskeletal:        General: Normal range of motion.  Skin:    General: Skin is warm and dry.     Capillary Refill: Capillary refill takes less than 2 seconds.  Neurological:     Mental Status: She is alert and oriented to person, place, and time.  Psychiatric:        Mood and Affect: Mood normal.        Behavior: Behavior normal.        Thought Content:  Thought content normal.        Judgment: Judgment normal.    Labs and Imaging Pelvic ultrasound ordered.  Assessment & Plan:  1. Abnormal uterine bleeding - US PELVIS (TRANSABDOMINAL ONLY); Future - Next visit will be with MD to review results of U/S and discuss further treatment  Bernerd Limbo, CNM 10/10/2020 2:02 PM

## 2020-10-10 NOTE — Progress Notes (Signed)
[place] scheduled for 10/23/20 at 1300; pt to arrive at 1245 with a full bladder. Called pt with appt information; VM left with appt details and callback number.  Fleet Contras RN 10/10/20

## 2020-10-10 NOTE — Patient Instructions (Signed)
If you are in need of transportation to get to and from your appointments in our office.  You can reach Transportation Services by calling [phone] Monday - Friday  7am-6pm.   

## 2020-10-17 ENCOUNTER — Telehealth: Payer: Self-pay | Admitting: Lactation Services

## 2020-10-17 NOTE — Telephone Encounter (Signed)
Patient called and LM on nurse voicemail that she would like a call back to discuss her upcoming appointment.

## 2020-10-18 NOTE — Telephone Encounter (Signed)
Called pt with [facility] # [phone] and asked pt what question(s) she had in regards to her appt.  Pt asks if she needs both appts.  I explained to the pt that she needs the appt on 10/23/20 for ultrasound and then on 11/07/20 with provider to discuss the results and management.  Pt verbalized understanding.   Monica Peters  10/18/20

## 2020-10-23 ENCOUNTER — Other Ambulatory Visit: Payer: Self-pay

## 2020-10-23 ENCOUNTER — Ambulatory Visit
Admission: RE | Admit: 2020-10-23 | Discharge: 2020-10-23 | Disposition: A | Discharge status: Home / Self Care | Payer: Self-pay | Source: Ambulatory Visit | Attending: Certified Nurse Midwife | Admitting: Certified Nurse Midwife

## 2020-10-23 DIAGNOSIS — N939 Abnormal uterine and vaginal bleeding, unspecified: Secondary | ICD-10-CM | POA: Insufficient documentation

## 2020-10-30 ENCOUNTER — Telehealth: Payer: Self-pay | Admitting: Primary Care

## 2020-10-30 NOTE — Telephone Encounter (Signed)
   Monica Peters DOB: [birth year] MRN: [id number]   RIDER WAIVER AND RELEASE OF LIABILITY  For purposes of improving physical access to our facilities, [facility] is pleased to partner with third parties to provide [place] patients or other authorized individuals the option of convenient, on-demand ground transportation services (the [employer]") through use of the technology service that enables users to request on-demand ground transportation from independent third-party providers.  By opting to use and accept these [employer], I, the undersigned, hereby agree on behalf of myself, and on behalf of any minor child using the [employer] for whom I am the parent or legal guardian, as follows:  1. Science writer provided to me are provided by independent third-party transportation providers who are not [employer] or employees and who are unaffiliated with [employer]. 2. [facility] is neither a transportation carrier nor a common or public carrier. 3. [facility] has no control over the quality or safety of the transportation that occurs as a result of the [employer]. 4. [facility] cannot guarantee that any third-party transportation provider will complete any arranged transportation service. 5. [facility] makes no representation, warranty, or guarantee regarding the reliability, timeliness, quality, safety, suitability, or availability of any of the Transport Services or that they will be error free. 6. I fully understand that traveling by vehicle involves risks and dangers of serious bodily injury, including permanent disability, paralysis, and death. I agree, on behalf of myself and on behalf of any minor child using the Transport Services for whom I am the parent or legal guardian, that the entire risk arising out of my use of the [employer] remains solely with me, to the maximum extent permitted under applicable law. 7. The  [employer] are provided "as is" and "as available." [facility] disclaims all representations and warranties, express, implied or statutory, not expressly set out in these terms, including the implied warranties of merchantability and fitness for a particular purpose. 8. I hereby waive and release [facility], its agents, employees, officers, directors, representatives, insurers, attorneys, assigns, successors, subsidiaries, and affiliates from any and all past, present, or future claims, demands, liabilities, actions, causes of action, or suits of any kind directly or indirectly arising from acceptance and use of the [employer]. 9. I further waive and release [facility] and its affiliates from all present and future liability and responsibility for any injury or death to persons or damages to property caused by or related to the use of the [employer]. 10. I have read this Waiver and Release of Liability, and I understand the terms used in it and their legal significance. This Waiver is freely and voluntarily given with the understanding that my right (as well as the right of any minor child for whom I am the parent or legal guardian using the [employer]) to legal recourse against [facility] in connection with the [employer] is knowingly surrendered in return for use of these services.   I attest that I read the consent document to Monica Peters, gave [name] the opportunity to ask questions and answered the questions asked (if any). I affirm that Monica Peters then provided consent for she's participation in this program.     Launa Grill, read to patient in [place]

## 2020-11-05 ENCOUNTER — Encounter (INDEPENDENT_AMBULATORY_CARE_PROVIDER_SITE_OTHER): Payer: Self-pay | Admitting: Primary Care

## 2020-11-05 ENCOUNTER — Other Ambulatory Visit (INDEPENDENT_AMBULATORY_CARE_PROVIDER_SITE_OTHER): Payer: Self-pay | Admitting: Primary Care

## 2020-11-05 ENCOUNTER — Other Ambulatory Visit: Payer: Self-pay

## 2020-11-05 ENCOUNTER — Ambulatory Visit (INDEPENDENT_AMBULATORY_CARE_PROVIDER_SITE_OTHER): Payer: Self-pay | Admitting: Primary Care

## 2020-11-05 VITALS — BP 132/84 | HR 85 | Temp 97.3°F | Ht 66.5 in | Wt 245.2 lb

## 2020-11-05 DIAGNOSIS — Z6838 Body mass index (BMI) 38.0-38.9, adult: Secondary | ICD-10-CM

## 2020-11-05 DIAGNOSIS — E6609 Other obesity due to excess calories: Secondary | ICD-10-CM

## 2020-11-05 DIAGNOSIS — M545 Low back pain, unspecified: Secondary | ICD-10-CM

## 2020-11-05 MED ORDER — IBUPROFEN 600 MG PO TABS: 600.0000 mg | ORAL_TABLET | Freq: Three times a day (TID) | ORAL | 1 refills | Status: DC | PRN

## 2020-11-05 MED ORDER — METHOCARBAMOL 500 MG PO TABS: 500.0000 mg | ORAL_TABLET | Freq: Three times a day (TID) | ORAL | 1 refills | Status: DC | PRN

## 2020-11-05 MED FILL — METHOCARBAMOL 500 MG TABS: 500 | 30 days supply | Qty: 90 | Fill #0

## 2020-11-05 MED FILL — ?IBUPROFEN 600 MG TABLETS: 600 | 30 days supply | Qty: 90 | Fill #0

## 2020-11-05 NOTE — Progress Notes (Signed)
LBP w/o urinary issues

## 2020-11-05 NOTE — Patient Instructions (Signed)
[email or web address]://[email or web address]">  Cmo controlar el dolor de espalda crnico Managing Chronic Back Pain El dolor de espalda crnico es el dolor de espalda que dura 12semanas o ms. Con frecuencia afecta la zona lumbar de la espalda. El dolor de espalda puede experimentarse como un dolor muscular o como un dolor agudo y punzante. Puede ser leve, moderado o intenso. Si le han diagnosticado dolor de espalda crnico, hay cosas que puede hacer para controlar los sntomas. Tal vez deba probar varias alternativas para determinar cul es la ms eficaz para usted. El mdico tambin podr darle instrucciones ms especficas. Cmo manejar los cambios en el estilo de vida El tratamiento del dolor de espalda crnico suele comenzar con reposo y alivio del dolor, y continuar con ejercicios para restablecer el movimiento y fortalecer la espalda (fisioterapia). Es posible que deba someterse a una ciruga si otros tratamientos no son eficaces, o si el dolor surge a raz de una afeccin o de una lesin. Siga el plan de tratamiento como se lo haya indicado el mdico. Esto puede incluir:  Tcnicas de relajacin.  Psicoterapia o asesoramiento psicolgico con un especialista de salud mental. La terapia cognitivo conductual (TCC), una forma de psicoterapia, puede ser especialmente til. Esta terapia le ayuda a establecer objetivos y a realizar un seguimiento de los cambios que hace.  Acupuntura o terapia de masajes.  Estimulacin elctrica local.  Inyecciones. Mediante las inyecciones, se administran anestsicos o analgsicos en la columna vertebral o en la zona de dolor. Cmo reconocer los cambios en el dolor de espalda crnico La afeccin puede mejorar con el tratamiento. Sin embargo, el dolor de espalda puede no desaparecer o puede empeorar con el tiempo. Preste mucha atencin a los sntomas e infrmele al mdico si los sntomas empeoran o no mejoran. El dolor de espalda puede empeorar si tiene lo  siguiente:  Dolor que comienza a causar problemas con la postura.  Dolor que empeora cuando est sentado o parado, o cuando camina, se inclina o levanta peso.  Dolor que lo afecta mientras est activo, en reposo, o en ambas ocasiones.  Dolor que con el tiempo hace que le resulte difcil desplazarse (limita la movilidad).  Dolor que se manifiesta con fiebre, prdida de peso o dificultad para orinar.  Dolor que ocasiona adormecimiento y hormigueo. Cmo usar la mecnica del cuerpo y la postura para aliviar el dolor La buena postura y la mecnica corporal saludable pueden ayudar a aliviar la tensin en la espalda. La mecnica corporal se refiere a los movimientos y a las posiciones del cuerpo durante las actividades diarias. La postura es una parte de la mecnica corporal. Una buena postura significa que:  La columna est en su posicin natural de curvatura en S, o posicin neutra.  Los hombros estn ligeramente hacia atrs.  La cabeza no est inclinada hacia adelante. Siga esas pautas para mejorar la postura y la mecnica corporal en sus actividades diarias. De pie  Al estar de pie, mantenga la columna en la posicin neutral y los pies separados al ancho de caderas, aproximadamente. Mantenga las rodillas levemente flexionadas. Las orejas, los hombros y las caderas deben estar alineados.  Cuando realice una tarea para la que deba estar de pie en el mismo lugar durante mucho tiempo, apoye un pie sobre un objeto estable que tenga de 2 a 4pulgadas (5a 10cm) de alto, como un taburete. Esto ayuda a que la columna mantenga una posicin neutral.   Sentado  Cuando est sentado, mantenga la columna en posicin neutral y   los pies apoyados en el suelo. Use un apoyapis, si es necesario, y mantenga los muslos paralelos al suelo. Evite redondear los hombros e inclinar la cabeza hacia adelante.  Cuando trabaje en un escritorio o con una computadora, el escritorio debe estar a una altura en la que las  manos estn un poco ms abajo que los codos. Deslice la silla debajo del escritorio, de modo de estar lo suficientemente cerca como para mantener una buena postura.  Cuando trabaje con una computadora, coloque el monitor a una altura que le permita mirar derecho hacia adelante, sin tener que inclinar la cabeza hacia adelante o hacia atrs para ver la pantalla.   Levantamiento de objetos  Mantenga los pies separados, como mnimo, el ancho de los hombros y apriete los msculos del abdomen.  Flexione las rodillas y la cadera, y mantenga la columna en posicin neutral. Asegrese de levantar los objetos utilizando la fuerza de las piernas, no de la espalda. No trabe las rodillas hacia afuera.  Siempre pida ayuda a otra persona para levantar objetos pesados o incmodos.   Reposo  Al estar acostado o en reposo, evite las posiciones que le causen ms dolor.  Si siente dolor al hacer actividades que exigen sentarse, inclinarse, agacharse o ponerse en cuclillas, acustese en una posicin en la que el cuerpo no deba doblarse mucho. Por ejemplo, evite acurrucarse de costado con los brazos y las rodillas cerca del pecho (posicin fetal).  Si siente dolor con las actividades que exigen estar de pie durante mucho tiempo o estirar los brazos, acustese con la columna en una posicin neutral y flexione ligeramente las rodillas. Intente lo siguiente: ? Acostarse de costado con una almohada entre las rodillas. ? Acostarse boca arriba con una almohada debajo de las rodillas.   Siga estas instrucciones en su casa: Medicamentos  El tratamiento puede incluir medicamentos para el dolor recetados o de venta libre para el dolor y la inflamacin que se toman por boca o que se aplican sobre la piel. Otro tratamiento puede incluir relajantes musculares. Use los medicamentos de venta libre y los recetados solamente como se lo haya indicado el mdico.  Pregntele al mdico si el medicamento recetado: ? Hace necesario que  evite conducir o usar maquinaria. ? Puede causarle estreimiento. Es posible que tenga que tomar estas medidas para prevenir o tratar el estreimiento:  Beber suficiente lquido como para mantener la orina de color amarillo plido.  Usar medicamentos recetados o de venta libre.  Consumir alimentos ricos en fibra, como frijoles, cereales integrales, y frutas y verduras frescas.  Limitar el consumo de alimentos ricos en grasa y azcares procesados, como los alimentos fritos o dulces. Estilo de vida  No consuma ningn producto que contenga nicotina o tabaco, como cigarrillos, cigarrillos electrnicos y tabaco de mascar. Si necesita ayuda para dejar de consumir estos productos, consulte al mdico.  Siga una dieta saludable que incluya frutas, verduras, pescado y carnes magras.  Trabaje con su mdico para alcanzar o mantener un peso saludable. Instrucciones generales  Haga ejercicio regularmente tal como se lo hayan indicado. El ejercicio mejora la flexibilidad y la fuerza.  Si le indicaron fisioterapia, haga los ejercicios como se lo haya indicado el mdico.  Use terapia con hielo o con calor como se lo haya indicado el mdico.  Concurra a todas las visitas de seguimiento como se lo haya indicado el mdico. Esto es importante. Dnde puedo obtener apoyo? Considere la posibilidad de unirse a un grupo de apoyo para personas   con dolor de espalda crnico. Pregunte a su mdico sobre grupos de apoyo de su zona. Tambin puede encontrar grupos de apoyo en lnea y presenciales en los siguientes sitios:  American Chronic Pain Association (Asociacin Estadounidense del Dolor Crnico): theacpa.org  Pain Connection Program (Programa de conexin del dolor): painconnection.org Comunquese con un mdico si:  Siente un dolor que no se alivia con reposo o medicamentos.  Su dolor empeora o tiene un dolor nuevo.  Tiene fiebre.  Pierde de peso con rapidez.  Tiene dificultad para [employer] cotidianas. Solicite ayuda de inmediato si:  Siente debilidad o adormecimiento en una o ambas piernas, o en uno o ambos pies.  Tiene dificultad para controlar la miccin o la defecacin.  Siente un dolor intenso en la espalda y tiene alguno de los siguientes sntomas: ? Nuseas o vmitos. ? Dolor abdominal. ? Le falta el aire o se desmaya. Resumen  Con frecuencia, el dolor de espalda crnico se trata con descanso, alivio del dolor y fisioterapia.  La psicoterapia, la acupuntura, los masajes y la estimulacin Editor, commissioning.  Siga el plan de tratamiento como se lo haya indicado el mdico.  Unirse a un grupo de apoyo puede ayudarlo a Administrator, sports de espalda crnico. Esta informacin no tiene Marine scientist el consejo del mdico. Asegrese de hacerle al mdico cualquier pregunta que tenga. Document Revised: 12/11/2019 Document Reviewed: 12/11/2019 Elsevier Patient Education  2021 [employer].

## 2020-11-05 NOTE — Progress Notes (Signed)
Subjective: CC: back pain PCP: Monica Sessions, NP HPI: [name] is a 35 y.o.Hispanic  female Monica Peters [phone]) presenting to clinic today for back pain. Concerns today include:  1. Back Pain Patient reports that pain began 2 months  h/o back pain.  Pain is a  5/10.  It does not  radiate.  Bending worsens pain.  Ibuprofen and hot compression improves pain.  Patient has been taking Ibuprofen 600 mg for pain with little relief.  Patient denies trauma or injury.  No dysuria, hematuria, fevers, chills, nausea, vomiting, abdominal pain, renal stones.  Denies urinary retention/incontinence, bowel incontinence, weakness, falls, sensation changes or pain anywhere else. No  h/o back surgeries.   Current Outpatient Medications:  .  levonorgestrel-ethinyl estradiol (NORDETTE) 0.15-30 MG-MCG tablet, Take 1 tablet by mouth daily., Disp: 28 tablet, Rfl: 11 .  albuterol (VENTOLIN HFA) 108 (90 Base) MCG/ACT inhaler, Inhale 2 puffs into the lungs every 6 (six) hours as needed for wheezing or shortness of breath., Disp: 8 g, Rfl: 0 .  gabapentin (NEURONTIN) 300 MG capsule, Take 1 capsule (300 mg total) by mouth 3 (three) times daily., Disp: 90 capsule, Rfl: 3 .  megestrol (MEGACE) 40 MG tablet, Take 2 tablets (80 mg total) by mouth 2 (two) times daily. (Patient not taking: No sig reported), Disp: 120 tablet, Rfl: 0 .  omeprazole (PRILOSEC) 20 MG capsule, Take 1 capsule (20 mg total) by mouth daily. (Patient not taking: Reported on 11/05/2020), Disp: 30 capsule, Rfl: 3 No Known Allergies  No past medical history on file. Social History   Socioeconomic History  . Marital status: Single    Spouse name: Not on file  . Number of children: Not on file  . Years of education: Not on file  . Highest education level: Not on file  Occupational History  . Not on file  Tobacco Use  . Smoking status: Never Smoker  . Smokeless tobacco: Never Used  Substance and Sexual Activity  . Alcohol  use: Never  . Drug use: Never  . Sexual activity: Yes  Other Topics Concern  . Not on file  Social History Narrative  . Not on file   Social Determinants of Health   Financial Resource Strain: Not on file  Food Insecurity: Food Insecurity Present  . Worried About Programme researcher, broadcasting/film/video in the Last Year: Sometimes true  . Ran Out of Food in the Last Year: Never true  Transportation Needs: Unmet Transportation Needs  . Lack of Transportation (Medical): Yes  . Lack of Transportation (Non-Medical): Yes  Physical Activity: Not on file  Stress: Not on file  Social Connections: Not on file  Intimate Partner Violence: Not on file   Past Surgical History:  Procedure Laterality Date  . CHOLECYSTECTOMY  2021    ROS: per HPI  Objective: Office vital signs reviewed. BP 132/84 (BP Location: Right Arm, Patient Position: Sitting, Cuff Size: Large)   Pulse 85   Temp (!) 97.3 F (36.3 C) (Temporal)   Ht 5' 6.5" (1.689 m)   Wt 245 lb 3.2 oz (111.2 kg)   LMP 10/06/2020 (Exact Date)   SpO2 99%   BMI 38.98 kg/m   Physical Examination:  General: Awake, alert, obese well nourished, female   Cardio: Regular rate and rhythm, S1S2 heard, no murmurs appreciated Pulm: Clear to auscultation bilaterally, no wheezes, rhonchi or rales Extremities: Warm, well-perfused. No edema, cyanosis or clubbing; MSK: normal gait and station Normal/lumbar  Spine:  AROM, No midline tenderness to palpation, No paraspinal tenderness to palpation.  No palpable bony deformities,  No pain with straight leg test Neuro: 5/5 lower extremity strength; lower extremity light touch sensation grossly intact, No problems with  heel walk, Toe Walk and Tandem Walk  Assessment/ Plan: [name] was seen today for back pain.  Diagnoses and all orders for this visit:  Acute bilateral low back pain without sciatica Work on losing weight to help reduce pain. May alternate with heat and ice application for pain relief. Ibuprofen  800cmg Q 6 prn and Robaxin 500mg  TID/PRN as prescribed pain relief. Other alternatives include massage, acupuncture and water aerobics.  You must stay active and avoid a sedentary lifestyle.  Class 2 obesity due to excess calories without serious comorbidity with body mass index (BMI) of 38.0 to 38.9 in adult Obesity is 30-39 indicating an excess in caloric intake or underlining conditions. This may lead to other co-morbidities. Lifestyle modifications of diet and exercise may reduce obesity.      The above assessment and management plan was discussed with the patient. The patient verbalized understanding of and has agreed to the management plan. Patient is aware to call the clinic if symptoms persist or worsen. Patient is aware when to return to the clinic for a follow-up visit. Patient educated on when it is appropriate to go to the emergency department.   , NP-C

## 2020-11-07 ENCOUNTER — Ambulatory Visit (INDEPENDENT_AMBULATORY_CARE_PROVIDER_SITE_OTHER): Payer: Self-pay | Admitting: Obstetrics & Gynecology

## 2020-11-07 ENCOUNTER — Encounter: Payer: Self-pay | Admitting: Obstetrics & Gynecology

## 2020-11-07 ENCOUNTER — Other Ambulatory Visit: Payer: Self-pay | Admitting: Obstetrics & Gynecology

## 2020-11-07 ENCOUNTER — Other Ambulatory Visit: Payer: Self-pay

## 2020-11-07 VITALS — BP 142/88 | HR 111 | Wt 240.1 lb

## 2020-11-07 DIAGNOSIS — Z30011 Encounter for initial prescription of contraceptive pills: Secondary | ICD-10-CM

## 2020-11-07 DIAGNOSIS — N939 Abnormal uterine and vaginal bleeding, unspecified: Secondary | ICD-10-CM

## 2020-11-07 DIAGNOSIS — Z3202 Encounter for pregnancy test, result negative: Secondary | ICD-10-CM

## 2020-11-07 LAB — POCT PREGNANCY, URINE: Preg Test, Ur: NEGATIVE

## 2020-11-07 MED ORDER — NORGESTIM-ETH ESTRAD TRIPHASIC 0.18/0.215/0.25 MG-35 MCG PO TABS: 1.0000 | ORAL_TABLET | Freq: Every day | ORAL | 11 refills | Status: DC

## 2020-11-07 MED FILL — TRI-LINYAH TABLET: 0.18/0.215/ | 28 days supply | Qty: 28 | Fill #0

## 2020-11-07 NOTE — Progress Notes (Signed)
Patient ID: Monica Peters, female   DOB: 04-24-1986, 35 y.o.   MRN: [id number]  Chief Complaint  Patient presents with  . abronmal bleeding    HPI Monica Peters Ouch is a 35 y.o. female.  [id number] No LMP recorded. LMP was 4 weeks ago and was light. She had taken Nordette for one year, with Megace also given in the interim. F/u for [place] result today HPI  History reviewed. No pertinent past medical history.  Past Surgical History:  Procedure Laterality Date  . CHOLECYSTECTOMY  2021    History reviewed. No pertinent family history.  Social History Social History   Tobacco Use  . Smoking status: Never Smoker  . Smokeless tobacco: Never Used  Substance Use Topics  . Alcohol use: Never  . Drug use: Never    No Known Allergies  Current Outpatient Medications  Medication Sig Dispense Refill  . albuterol (VENTOLIN HFA) 108 (90 Base) MCG/ACT inhaler Inhale 2 puffs into the lungs every 6 (six) hours as needed for wheezing or shortness of breath. 8 g 0  . ibuprofen (ADVIL) 600 MG tablet Take 1 tablet (600 mg total) by mouth every 8 (eight) hours as needed for moderate pain. 90 tablet 1  . methocarbamol (ROBAXIN) 500 MG tablet Take 1 tablet (500 mg total) by mouth every 8 (eight) hours as needed for muscle spasms. 90 tablet 1  . Norgestimate-Ethinyl Estradiol Triphasic (TRI-SPRINTEC) 0.18/0.215/0.25 MG-35 MCG tablet Take 1 tablet by mouth daily. 28 tablet 11  . omeprazole (PRILOSEC) 20 MG capsule Take 1 capsule (20 mg total) by mouth daily. 30 capsule 3  . megestrol (MEGACE) 40 MG tablet Take 2 tablets (80 mg total) by mouth 2 (two) times daily. (Patient not taking: No sig reported) 120 tablet 0   No current facility-administered medications for this visit.    Review of Systems Review of Systems  Constitutional: Negative.   Respiratory: Negative.   Genitourinary: Positive for flank pain (occasional LLQ brief sharp pain) and menstrual problem. Negative for vaginal  bleeding and vaginal discharge.    Blood pressure (!) 142/88, pulse (!) 111, weight 240 lb 1.6 oz (108.9 kg).  Physical Exam Physical Exam Vitals and nursing note reviewed.  Constitutional:      Appearance: She is not ill-appearing.  Pulmonary:     Effort: Pulmonary effort is normal.  Neurological:     Mental Status: She is alert.  Psychiatric:        Mood and Affect: Mood normal.        Behavior: Behavior normal.     Data Reviewed CLINICAL DATA:  Abnormal uterine bleeding  EXAM: TRANSABDOMINAL ULTRASOUND OF PELVIS  TECHNIQUE: Transabdominal ultrasound examination of the pelvis was performed including evaluation of the uterus, ovaries, adnexal regions, and pelvic cul-de-sac.  COMPARISON:  None.  FINDINGS: Uterus  Measurements: 9.2 x 5.0 by 5.7 cm = volume: 136.9 mL. No fibroids or other mass visualized.  Endometrium  Thickness: 9 mm.  No focal abnormality visualized.  Right ovary  Measurements: 3.5 x 2.4 by 2.1 cm = volume: 9.3 mL. Normal appearance/no adnexal mass.  Left ovary  Measurements: 4.8 x 1.8 x 2.2 cm = volume: 9.8 mL. Normal appearance/no adnexal mass.  Other findings:  No abnormal free fluid.  IMPRESSION: 1. Age-appropriate pelvic ultrasound. No etiology identified to explain the patient's abnormal uterine bleeding. If bleeding remains unresponsive to hormonal or medical therapy, sonohysterogram should be considered for focal lesion work-up. (Ref: Radiological Reasoning: Algorithmic Workup of Abnormal Vaginal Bleeding with  Endovaginal Sonography and Sonohysterography. AJR 2008; 563:J49-70)   Electronically Signed   By: Sharlet Salina M.D.   On: 10/24/2020 21:27   Assessment BTB on OCP Normal pelvic US MS pain Plan Meds ordered this encounter  Medications  . Norgestimate-Ethinyl Estradiol Triphasic (TRI-SPRINTEC) 0.18/0.215/0.25 MG-35 MCG tablet    Sig: Take 1 tablet by mouth daily.    Dispense:  28 tablet     Refill:  11  RTC 6 months to review progress     Scheryl Darter 11/07/2020, 10:51 AM

## 2020-11-07 NOTE — Progress Notes (Signed)
Patient complains that she has irregular periods and that she has them 2x a month. January to March the period cycle lasts for about 4 days, while in December the cycle lasted for about 6 days. Pregnancy test was done and results were negative. Patient also stated that she is 10 days late.

## 2020-11-11 NOTE — Addendum Note (Signed)
Addended by: Grayce Sessions on: 11/11/2020 10:08 PM   Modules accepted: Level of Service

## 2020-12-01 ENCOUNTER — Other Ambulatory Visit: Payer: Self-pay

## 2021-01-03 ENCOUNTER — Other Ambulatory Visit: Payer: Self-pay

## 2021-01-03 MED FILL — Norgestimate-Eth Estrad Tab 0.18-35/0.215-35/0.25-35 MG-MCG: ORAL | 28 days supply | Qty: 28 | Fill #0 | Status: AC

## 2021-04-25 ENCOUNTER — Other Ambulatory Visit: Payer: Self-pay

## 2021-04-25 ENCOUNTER — Ambulatory Visit: Payer: Self-pay | Attending: Primary Care

## 2021-05-08 ENCOUNTER — Other Ambulatory Visit: Payer: Self-pay

## 2021-05-08 ENCOUNTER — Encounter (INDEPENDENT_AMBULATORY_CARE_PROVIDER_SITE_OTHER): Payer: Self-pay | Admitting: Primary Care

## 2021-05-08 ENCOUNTER — Ambulatory Visit (INDEPENDENT_AMBULATORY_CARE_PROVIDER_SITE_OTHER): Payer: Self-pay | Admitting: Primary Care

## 2021-05-08 VITALS — BP 124/80 | HR 73 | Temp 97.5°F | Ht 66.5 in | Wt 237.6 lb

## 2021-05-08 DIAGNOSIS — E6609 Other obesity due to excess calories: Secondary | ICD-10-CM

## 2021-05-08 DIAGNOSIS — F411 Generalized anxiety disorder: Secondary | ICD-10-CM

## 2021-05-08 DIAGNOSIS — M545 Low back pain, unspecified: Secondary | ICD-10-CM

## 2021-05-08 DIAGNOSIS — Z23 Encounter for immunization: Secondary | ICD-10-CM

## 2021-05-08 DIAGNOSIS — G8929 Other chronic pain: Secondary | ICD-10-CM

## 2021-05-08 DIAGNOSIS — N898 Other specified noninflammatory disorders of vagina: Secondary | ICD-10-CM

## 2021-05-08 DIAGNOSIS — E7800 Pure hypercholesterolemia, unspecified: Secondary | ICD-10-CM

## 2021-05-08 DIAGNOSIS — R748 Abnormal levels of other serum enzymes: Secondary | ICD-10-CM

## 2021-05-08 DIAGNOSIS — Z0130 Encounter for examination of blood pressure without abnormal findings: Secondary | ICD-10-CM

## 2021-05-08 DIAGNOSIS — Z6837 Body mass index (BMI) 37.0-37.9, adult: Secondary | ICD-10-CM

## 2021-05-08 MED ORDER — IBUPROFEN 600 MG PO TABS
600.0000 mg | ORAL_TABLET | Freq: Three times a day (TID) | ORAL | 1 refills | Status: DC | PRN
  Filled 2021-05-08: qty 90, 30d supply, fill #0

## 2021-05-08 MED ORDER — BUSPIRONE HCL 5 MG PO TABS
5.0000 mg | ORAL_TABLET | Freq: Two times a day (BID) | ORAL | 0 refills | Status: DC
  Filled 2021-05-08: qty 60, 30d supply, fill #0

## 2021-05-08 MED FILL — Methocarbamol Tab 500 MG: ORAL | 30 days supply | Qty: 90 | Fill #0 | Status: AC

## 2021-05-08 NOTE — Progress Notes (Signed)
Established Patient Office Visit  Subjective:  Patient ID: Monica Peters, female    DOB: 03/10/1986  Age: 35 y.o. MRN: [id number]  CC:  Chief Complaint  Patient presents with   Blood Pressure Check   Weight Check    HPI Monica Peters is a 35 year old Hispanic female.  That speaks fluent [place].  She presents for Blood pressure follow up and weight management. Blood pressure is unremarkable at 124/80 no medication she has changed her diet and exercising and has lost 10 pounds approximately since March 2022.  She voices no problems or concerns.  She is also glad that her menstrual cycle has become more regular once a month lasting 4 to 5 days.  No past medical history on file.  Past Surgical History:  Procedure Laterality Date   CHOLECYSTECTOMY  2021    No family history on file.  Social History   Socioeconomic History   Marital status: Single    Spouse name: Not on file   Number of children: Not on file   Years of education: Not on file   Highest education level: Not on file  Occupational History   Not on file  Tobacco Use   Smoking status: Never   Smokeless tobacco: Never  Substance and Sexual Activity   Alcohol use: Never   Drug use: Never   Sexual activity: Yes    Birth control/protection: Pill  Other Topics Concern   Not on file  Social History Narrative   Not on file   Social Determinants of Health   Financial Resource Strain: Not on file  Food Insecurity: Food Insecurity Present   Worried About [place] in the Last Year: Sometimes true   Ran Out of Food in the Last Year: Sometimes true  Transportation Needs: Unmet Transportation Needs   Lack of Transportation (Medical): Yes   Lack of Transportation (Non-Medical): Yes  Physical Activity: Not on file  Stress: Not on file  Social Connections: Not on file  Intimate Partner Violence: Not on file    Outpatient Medications Prior to Visit  Medication Sig Dispense  Refill   albuterol (VENTOLIN HFA) 108 (90 Base) MCG/ACT inhaler Inhale 2 puffs into the lungs every 6 (six) hours as needed for wheezing or shortness of breath. 8 g 0   methocarbamol (ROBAXIN) 500 MG tablet TAKE 1 TABLET (500 MG TOTAL) BY MOUTH EVERY 8 (EIGHT) HOURS AS NEEDED FOR MUSCLE SPASMS. 90 tablet 1   Norgestimate-Ethinyl Estradiol Triphasic 0.18/0.215/0.25 MG-35 MCG tablet TAKE 1 TABLET BY MOUTH DAILY. 28 tablet 11   ibuprofen (ADVIL) 600 MG tablet Take 1 tablet (600 mg total) by mouth every 8 (eight) hours as needed for moderate pain. 90 tablet 1   omeprazole (PRILOSEC) 20 MG capsule Take 1 capsule (20 mg total) by mouth daily. (Patient not taking: Reported on 05/08/2021) 30 capsule 3   megestrol (MEGACE) 40 MG tablet TAKE 2 TABLETS (80 MG TOTAL) BY MOUTH 2 (TWO) TIMES DAILY. (Patient not taking: No sig reported) 120 tablet 0   No facility-administered medications prior to visit.    No Known Allergies  ROS Review of Systems  Musculoskeletal:  Positive for back pain.  Psychiatric/Behavioral:         She does have anxiety and she bites her fingernails when she becomes nervous.  All other systems reviewed and are negative.    Objective:   BP 124/80 (BP Location: Right Arm, Patient Position: Sitting, Cuff Size: Large)   Pulse 73  Temp (!) 97.5 F (36.4 C) (Temporal)   Ht 5' 6.5" (1.689 m)   Wt 237 lb 9.6 oz (107.8 kg)   SpO2 95%   BMI 37.78 kg/m   Wt Readings from Last 3 Encounters:  05/08/21 237 lb 9.6 oz (107.8 kg)  11/07/20 240 lb 1.6 oz (108.9 kg)  11/05/20 245 lb 3.2 oz (111.2 kg)   Physical Exam General: No apparent distress.Obese female Eyes: Extraocular eye movements intact, pupils equal and round. Neck: thick, Supple, trachea midline.- snores (does not interfere with sleep/no apnea.) Thyroid: No enlargement, mobile without fixation, no tenderness. Cardiovascular: Regular rhythm and rate, no murmur, normal radial pulses. Respiratory: Normal respiratory effort,  clear to auscultation. Gastrointestinal: Normal pitch active bowel sounds, nontender abdomen without distention or appreciable hepatomegaly. Neurologic: deep tendon reflexes +, no tremor, Musculoskeletal: Normal muscle tone, no tenderness on palpation of tibia, no excessive thoracic kyphosis. Skin: Appropriate warmth, no visible rash. Mental status: Alert, conversant, speech clear, thought logical, appropriate mood and affect, no hallucinations or delusions evident. Hematologic/lymphatic: No cervical adenopathy, no visible ecchymoses.   Health Maintenance Due  Topic Date Due   COVID-19 Vaccine (2 - Booster for Janssen series) 04/05/2020    There are no preventive care reminders to display for this patient.  No results found for: TSH Lab Results  Component Value Date   WBC 5.1 09/10/2020   HGB 13.8 09/10/2020   HCT 40.5 09/10/2020   MCV 90 09/10/2020   PLT 291 09/10/2020   Lab Results  Component Value Date   NA 139 09/10/2020   K 3.9 09/10/2020   CO2 21 09/10/2020   GLUCOSE 85 09/10/2020   BUN 9 09/10/2020   CREATININE 0.59 09/10/2020   BILITOT 0.5 09/10/2020   ALKPHOS 145 (H) 09/10/2020   AST 64 (H) 09/10/2020   ALT 132 (H) 09/10/2020   PROT 7.0 09/10/2020   ALBUMIN 4.6 09/10/2020   CALCIUM 10.4 (H) 09/10/2020   Lab Results  Component Value Date   CHOL 193 09/10/2020   Lab Results  Component Value Date   HDL 54 09/10/2020   Lab Results  Component Value Date   LDLCALC 124 (H) 09/10/2020   Lab Results  Component Value Date   TRIG 82 09/10/2020   Lab Results  Component Value Date   CHOLHDL 3.6 09/10/2020   No results found for: HGBA1C    Assessment & Plan:  Kylar was seen today for blood pressure check and weight check.  Diagnoses and all orders for this visit:  Need for immunization against influenza -     Flu Vaccine QUAD 32moIM (Fluarix, Fluzone & Alfiuria Quad PF)  Blood pressure check Life style modification watching what she is eating and  exercising BP wnl.  Class 2 obesity due to excess calories without serious comorbidity with body mass index (BMI) of 37.0 to 37.9 in adult Obesity is 30-39 indicating an excess in caloric intake or underlining conditions. This may lead to other co-morbidities. Lifestyle modifications of diet and exercise may reduce obesity.  Lost approx 9-10 since March exercising and charging your diet   Vaginal discharge Resolved . Discussed proper hygiene to decrease infection.  Chronic bilateral low back pain without sciatica  BACK PAIN  Location: lumbar/sacral Quality: uncomfortable Onset: unchanged Worse with: bending       Better with:  nothing : Radiation: No Trauma: No Best sitting/standing/leaning forward: no  Red Flags Fecal/urinary incontinence: no  Numbness/Weakness: no  Fever/chills/sweats: no  Night pain: yes  Unexplained weight  loss: no  No relief with bedrest: no  h/o cancer/immunosuppression: no  IV drug use: no  PMH of osteoporosis or chronic steroid use: no   Demonstrated proper use of picking up object - using her knees and not back- if too heavy get help. Return demonstration.   Chronic bilateral low back pain without sciatica -     ibuprofen (ADVIL) 600 MG tablet; Take 1 tablet (600 mg total) by mouth every 8 (eight) hours as needed for moderate pain.  Generalized anxiety disorder -     busPIRone (BUSPAR) 5 MG tablet; Take 1 tablet (5 mg total) by mouth 2 (two) times daily.  Elevated liver enzymes -     CMP14+EGFR  Elevated LDL cholesterol level -     Lipid Panel  Acute bilateral low back pain without sciatica -     ibuprofen (ADVIL) 600 MG tablet; Take 1 tablet (600 mg total) by mouth every 8 (eight) hours as needed for moderate pain.    Follow-up: Return in about 6 weeks (around 06/19/2021) for f/u on medication for anxiety .    Kerin Perna, NP

## 2021-05-08 NOTE — Patient Instructions (Addendum)
Gripe en los adultos Influenza, Adult A la gripe tambin se la conoce como "influenza". Es una infeccin en los pulmones, la nariz y la garganta (vas respiratorias). Se transmite fcilmente de persona a persona (es contagiosa). La gripe causa sntomas que son como los de un resfro, junto con fiebre alta y dolores corporales. Cules son las causas? La causa de esta afeccin es el virus de la influenza. Puede contraer el virus de las siguientes maneras: Al inhalar gotitas que quedan en el aire despus de que una persona infectada con gripe tosi o estornud. Al tocar algo que est contaminado con el virus y luego llevarse la mano a la boca, la nariz o los ojos. Qu incrementa el riesgo? Hay ciertas cosas que lo pueden hacer ms propenso a tener gripe. Estas incluyen lo siguiente: No lavarse las manos con frecuencia. Tener contacto cercano con muchas personas durante la temporada de resfro y gripe. Tocarse la boca, los ojos o la nariz sin antes lavarse las manos. No recibir la vacuna antigripal todos los aos. Puede correr un mayor riesgo de tener gripe, y problemas graves, como una infeccin pulmonar (neumona), si usted: Es mayor de 65 aos de edad. Est embarazada. Tiene debilitado el sistema que combate las defensas (sistema inmunitario) debido a una enfermedad o a que toma determinados medicamentos. Tiene una afeccin a largo plazo (crnica), como las siguientes: Enfermedad cardaca, renal o pulmonar. Diabetes. Asma. Tiene un trastorno heptico. Tiene mucho sobrepeso (obesidad mrbida). Tiene anemia. Cules son los signos o sntomas? Los sntomas normalmente comienzan de repente y duran entre 4 y 14 das. Pueden incluir los siguientes: Fiebre y escalofros. Dolores de cabeza, dolores en el cuerpo o dolores musculares. Dolor de garganta. Tos. Secrecin o congestin nasal. Molestias en el pecho. No querer comer tanto como lo hace normalmente. Sensacin de debilidad o  cansancio. Mareos. Malestar estomacal o vmitos. Cmo se trata? Si la gripe se detecta de forma temprana, puede recibir tratamiento con medicamentos antivirales. Esto puede ayudar a reducir la gravedad y la duracin de la enfermedad. Se los administrarn por boca o a travs de un tubo (catter) intravenoso. Cuidarse en su hogar puede ayudar a que mejoren los sntomas. El mdico puede recomendarle lo siguiente: Tomar medicamentos de venta libre. Beber abundante lquido. La gripe suele desaparecer sola. Si tiene sntomas muy graves u otros problemas, puede recibir tratamiento en un hospital. Siga estas instrucciones en su casa:   Actividad Descanse todo lo que sea necesario. Duerma lo suficiente. Qudese en su casa y no concurra al trabajo o a la escuela, como se lo haya indicado el mdico. No salga de su casa hasta que no haya tenido fiebre por 24 horas sin tomar medicamentos. Salga de su casa solamente para ir al mdico. Comida y bebida Tome una SRO (solucin de rehidratacin oral). Es una bebida que se vende en farmacias y tiendas. Beba suficiente lquido como para mantener la orina de color amarillo plido. En la medida en que pueda, beba lquidos transparentes en pequeas cantidades. Los lquidos transparentes son, por ejemplo: Agua. Trocitos de hielo. Jugo de frutas mezclado con agua. Bebidas deportivas de bajas caloras. Coma alimentos suaves que sean fciles de digerir. En la medida que pueda, consuma pequeas cantidades. Estos alimentos incluyen: Bananas. Pur de manzana. Arroz. Carnes magras. Tostadas. Galletas. No coma ni beba lo siguiente: Lquidos con alto contenido de azcar o cafena. Alcohol. Alimentos condimentados o con alto contenido de grasa. Indicaciones generales Use los medicamentos de venta libre y los recetados solamente   como se lo haya indicado el mdico. Use un humidificador de aire fro para que el aire de su casa est ms hmedo. Esto puede facilitar  la respiracin. Cuando utilice un humidificador de vapor fro, lmpielo a diario. Vace el agua y [place] por agua limpia. Al toser o estornudar, cbrase la boca y la [place]. Lvese las manos frecuentemente con agua y [place] y durante al menos 20 segundos. Esto tambin es importante despus de toser o Engineering geologist. Si no dispone de [place] y [place], use desinfectante para manos con alcohol. Cumpla con todas las visitas de seguimiento. Cmo se previene?  Colquese la vacuna antigripal todos los [place]. Puede colocarse la vacuna contra la gripe a fines de verano, en otoo o en invierno. Pregntele al mdico cundo debe aplicarse la vacuna contra la gripe. Evite el contacto con personas que estn enfermas durante el otoo y el invierno. Es la temporada del resfro y Emergency planning/management officer. Comunquese con un mdico si: Tiene sntomas nuevos. Tiene los siguientes sntomas: Dolor de [place]. Materia fecal lquida (diarrea). Grant Ruts. La tos empeora. Empieza a tener ms mucosidad. Tiene Programme researcher, broadcasting/film/video. Vomita. Solicite ayuda de inmediato si: Le falta el aire. Tiene dificultad para respirar. La piel o las uas se ponen de un color azulado. Presenta dolor muy intenso o rigidez en el cuello. Tiene dolor de cabeza repentino. Le duele la cara o el odo de forma repentina. No puede comer ni beber sin vomitar. Estos sntomas pueden representar un problema grave que constituye Radio broadcast assistant. Solicite atencin mdica de inmediato. Comunquese con el servicio de emergencias de su localidad (911 en los [place]). No espere a ver si los sntomas desaparecen. No conduzca por sus propios medios [employer]. Resumen A la gripe tambin se la conoce como "influenza". Es una [employer] , la nariz y Administrator. Se transmite fcilmente de [place] persona a otra. Use los medicamentos de venta libre y los recetados solamente como se lo haya indicado el mdico. Aplicarse la vacuna contra la gripe todos los  aos es la mejor manera de no contagiarse la gripe. Esta informacin no tiene Theme park manager el consejo del mdico. Asegrese de hacerle al mdico cualquier pregunta que tenga. Document Revised: 06/13/2020 Document Reviewed: 06/13/2020 Elsevier Patient Education  2022 Elsevier Inc. Buspirone Tablets Qu es [place] medicamento? La BUSPIRONA se [place] para tratar los trastornos de ansiedad. Este medicamento puede ser utilizado para otros usos; si tiene alguna pregunta consulte con su proveedor de atencin mdica o con su farmacutico. MARCAS COMUNES: BuSpar Qu le debo informar a mi profesional de la salud antes de tomar este medicamento? Necesita saber si usted presenta alguno de los [employer] o situaciones: enfermedad heptica o renal una reaccin alrgica o inusual a la buspirona, otros medicamentos, alimentos, colorantes o conservantes si est embarazada o buscando quedar embarazada si est amamantando a un beb Cmo debo [employer]? Tome este medicamento por va oral con un vaso de agua. Siga las instrucciones de la etiqueta del [place]. Puede tomar este medicamento con o sin alimentos. Si embargo, para asegurarse de que el medicamento siempre acte en su cuerpo de la misma Register, debe tomarlo con alimentos o bien sin alimentos en todas las ocasiones. Tome sus dosis a intervalos regulares. No tome su medicamento con una frecuencia mayor a la indicada. No deje de tomarlo excepto si as lo indica su mdico o su profesional de [employer]. Hable con su pediatra para informarse acerca del uso de [employer] en  nios. Puede requerir atencin especial. Sobredosis: Pngase en contacto inmediatamente con un centro toxicolgico o una sala de urgencia si usted cree que haya tomado demasiado medicamento. ATENCIN: [employer] es solo para usted. No comparta este medicamento con nadie. Qu sucede si me olvido de una dosis? Si olvida una dosis, tmela lo antes  posible. Si es casi la hora de la prxima dosis, tome slo esa dosis. No tome dosis adicionales o dobles. Qu puede interactuar con este medicamento? No tome esta medicina con ninguno de los siguientes medicamentos: linezolid IMAOs, tales como [place], [place], [place], [place] o Parnate azul de metileno procarbazina Esta medicina tambin puede interactuar con los siguientes medicamentos: diazepam digoxina diltiazem eritromicina jugo de toronja haloperidol medicamentos para la depresin mental o los problemas de estados de nimo medicamentos para convulsiones, tales como [place], fenobarbital y fenitona nefazodona otros medicamentos para la ansiedad rifampicina ritonavir algunos medicamentos antimicticos, tales como itraconazol, quetoconazol y voriconazol verapamilo warfarina Puede ser que esta lista no menciona todas las posibles interacciones. Informe a su profesional de [employer] de [employer] productos a base de hierbas, medicamentos de [place] o suplementos nutritivos que est tomando. Si usted fuma, consume bebidas alcohlicas o si utiliza drogas ilegales, indqueselo tambin a su profesional de [employer]. Algunas sustancias pueden interactuar con su medicamento. A qu debo estar atento al usar [employer]? Visite a su mdico o a su profesional de la salud para chequear su evolucin peridicamente. Es posible que transcurran 1 o 2 semanas hasta que su ansiedad desaparezca. Puede experimentar somnolencia o mareos. No conduzca ni utilice maquinaria ni haga nada que Scientist, research (life sciences) en estado de alerta hasta que sepa cmo le afecta este medicamento. No se siente ni se ponga de pie con rapidez, especialmente si es un paciente de edad avanzada. Esto reduce el riesgo de mareos o [employer]. El alcohol puede aumentar los mareos y la somnolencia. Evite consumir bebidas alcohlicas. Qu efectos secundarios puedo tener al [employer] este medicamento? Efectos secundarios que debe  informar a su mdico o a Producer, television/film/video de la salud tan pronto como sea posible: visin borrosa u otros cambios en la visin dolor en el pecho confusin dificultad para respirar sentimientos de hostilidad o ira molestias y dolores musculares hormigueo o entumecimiento de manos o pies zumbidos en los odos erupcin cutnea y picazn vmito debilidad Efectos secundarios que, por lo general, no requieren Psychologist, prison and probation services (debe informarlos a su mdico o a Producer, television/film/video de la salud si persisten o si son molestos): sueos alterados, pesadillas dolor de cabeza nuseas inquietud o nerviosismo dolor de garganta y Lexicographer Puede ser que esta lista no menciona todos los posibles efectos secundarios. Comunquese a su mdico por asesoramiento mdico [employer]. Usted puede informar los efectos secundarios a la FDA por telfono al 1-800-FDA-1088. Dnde debo guardar mi medicina? Mantngala fuera del alcance de los nios. Gurdela a [employer], menos de 30 grados C (86 grados F). Protjala de la luz. Mantenga el envase bien cerrado. Deseche todo el medicamento que no haya utilizado, despus de la fecha de vencimiento. ATENCIN: Este folleto es un resumen. Puede ser que no cubra toda la posible informacin. Si usted tiene preguntas acerca de esta medicina, consulte con su mdico, su farmacutico o su profesional de Radiographer, therapeutic.  2022 Elsevier/Gold Standard (2014-10-09 00:00:00)

## 2021-05-09 LAB — CMP14+EGFR
ALT: 272 IU/L — ABNORMAL HIGH (ref 0–32)
AST: 158 IU/L — ABNORMAL HIGH (ref 0–40)
Albumin/Globulin Ratio: 1.8 (ref 1.2–2.2)
Albumin: 4.7 g/dL (ref 3.8–4.8)
Alkaline Phosphatase: 130 IU/L — ABNORMAL HIGH (ref 44–121)
BUN/Creatinine Ratio: 11 (ref 9–23)
BUN: 7 mg/dL (ref 6–20)
Bilirubin Total: 0.4 mg/dL (ref 0.0–1.2)
CO2: 22 mmol/L (ref 20–29)
Calcium: 10.7 mg/dL — ABNORMAL HIGH (ref 8.7–10.2)
Chloride: 104 mmol/L (ref 96–106)
Creatinine, Ser: 0.61 mg/dL (ref 0.57–1.00)
Globulin, Total: 2.6 g/dL (ref 1.5–4.5)
Glucose: 86 mg/dL (ref 65–99)
Potassium: 4 mmol/L (ref 3.5–5.2)
Sodium: 138 mmol/L (ref 134–144)
Total Protein: 7.3 g/dL (ref 6.0–8.5)
eGFR: 118 mL/min/{1.73_m2} (ref >59)

## 2021-05-09 LAB — LIPID PANEL
Chol/HDL Ratio: 3.4 ratio (ref 0.0–4.4)
Cholesterol, Total: 191 mg/dL (ref 100–199)
HDL: 57 mg/dL (ref >39)
LDL Chol Calc (NIH): 120 mg/dL — ABNORMAL HIGH (ref 0–99)
Triglycerides: 75 mg/dL (ref 0–149)
VLDL Cholesterol Cal: 14 mg/dL (ref 5–40)

## 2021-06-19 ENCOUNTER — Other Ambulatory Visit: Payer: Self-pay

## 2021-06-19 ENCOUNTER — Ambulatory Visit (INDEPENDENT_AMBULATORY_CARE_PROVIDER_SITE_OTHER): Payer: Self-pay | Admitting: Primary Care

## 2021-06-19 ENCOUNTER — Encounter (INDEPENDENT_AMBULATORY_CARE_PROVIDER_SITE_OTHER): Payer: Self-pay | Admitting: Primary Care

## 2021-06-19 VITALS — BP 124/75 | HR 69 | Temp 97.2°F | Ht 66.5 in | Wt 233.0 lb

## 2021-06-19 DIAGNOSIS — M25531 Pain in right wrist: Secondary | ICD-10-CM

## 2021-06-19 DIAGNOSIS — M722 Plantar fascial fibromatosis: Secondary | ICD-10-CM

## 2021-06-19 DIAGNOSIS — M545 Low back pain, unspecified: Secondary | ICD-10-CM

## 2021-06-19 DIAGNOSIS — F411 Generalized anxiety disorder: Secondary | ICD-10-CM

## 2021-06-19 DIAGNOSIS — G8929 Other chronic pain: Secondary | ICD-10-CM

## 2021-06-19 MED ORDER — BUSPIRONE HCL 7.5 MG PO TABS
7.5000 mg | ORAL_TABLET | Freq: Three times a day (TID) | ORAL | 0 refills | Status: DC
  Filled 2021-06-19: qty 90, 30d supply, fill #0

## 2021-06-19 MED ORDER — IBUPROFEN 600 MG PO TABS
600.0000 mg | ORAL_TABLET | Freq: Three times a day (TID) | ORAL | 1 refills | Status: DC | PRN
  Filled 2021-06-19: qty 90, 30d supply, fill #0

## 2021-06-19 NOTE — Progress Notes (Signed)
HPI [name] is a Hispanic 35 y.o.female presents for right wrist pain and she is right handed - she works as a Lawyer, bending , repetitious motion and ball of foot hurts and middle . Aggravating factors walking, standing for periods of time and claiming stairs. Alleviating factors off her feet. Rates pain 8/10. OTC medication tylenol and NSAIDS did not work as well as the prescribes ibuprofen.   No past medical history on file.   No Known Allergies    Current Outpatient Medications on File Prior to Visit  Medication Sig Dispense Refill   albuterol (VENTOLIN HFA) 108 (90 Base) MCG/ACT inhaler Inhale 2 puffs into the lungs every 6 (six) hours as needed for wheezing or shortness of breath. 8 g 0   busPIRone (BUSPAR) 5 MG tablet Take 1 tablet (5 mg total) by mouth 2 (two) times daily. 120 tablet 0   ibuprofen (ADVIL) 600 MG tablet Take 1 tablet (600 mg total) by mouth every 8 (eight) hours as needed for moderate pain. 90 tablet 1   methocarbamol (ROBAXIN) 500 MG tablet TAKE 1 TABLET (500 MG TOTAL) BY MOUTH EVERY 8 (EIGHT) HOURS AS NEEDED FOR MUSCLE SPASMS. 90 tablet 1   Norgestimate-Ethinyl Estradiol Triphasic 0.18/0.215/0.25 MG-35 MCG tablet TAKE 1 TABLET BY MOUTH DAILY. 28 tablet 11   omeprazole (PRILOSEC) 20 MG capsule Take 1 capsule (20 mg total) by mouth daily. (Patient not taking: Reported on 06/19/2021) 30 capsule 3   [DISCONTINUED] gabapentin (NEURONTIN) 300 MG capsule Take 1 capsule (300 mg total) by mouth 3 (three) times daily. 90 capsule 3   [DISCONTINUED] levonorgestrel-ethinyl estradiol (NORDETTE) 0.15-30 MG-MCG tablet Take 1 tablet by mouth daily. (Patient not taking: Reported on 11/07/2020) 28 tablet 11   No current facility-administered medications on file prior to visit.    ROS: all negative except above.   Physical Exam: Filed Weights   06/19/21 0833  Weight: 233 lb (105.7 kg)   BP 124/75 (BP Location: Right Arm, Patient Position:  Sitting, Cuff Size: Large)   Pulse 69   Temp (!) 97.2 F (36.2 C) (Temporal)   Ht 5' 6.5" (1.689 m)   Wt 233 lb (105.7 kg)   LMP 06/15/2021 (Exact Date)   SpO2 99%   BMI 37.04 kg/m  General Appearance: Well nourished, in no apparent distress. Eyes: PERRLA, EOMs, conjunctiva no swelling or erythema Sinuses: No Frontal/maxillary tenderness ENT/Mouth: Ext aud canals clear, TMs without erythema, bulging. No erythema, swelling, or exudate on post pharynx.  Tonsils not swollen or erythematous. Hearing normal.  Neck: Supple, thyroid normal.  Respiratory: Respiratory effort normal, BS equal bilaterally without rales, rhonchi, wheezing or stridor.  Cardio: RRR with no MRGs. Brisk peripheral pulses without edema.  Abdomen: Soft, + BS.  Non tender, no guarding, rebound, hernias, masses. Lymphatics: Non tender without lymphadenopathy.  Musculoskeletal: Full ROM, 5/5 strength, normal gait.  Skin: Warm, dry without rashes, lesions, ecchymosis.  Neuro: Cranial nerves intact. Normal muscle tone, no cerebellar symptoms. Sensation intact.  Psych: Awake and oriented X 3, normal affect, Insight and Judgment appropriate.    Olivianna was seen today for anxiety.  Diagnoses and all orders for this visit:  Plantar fasciitis of right foot AVS printed in Spanish what diagnosis is and explained to her the cheapest and easiest treatment freezing water bottle and rolling it mid part of her foot  Chronic bilateral low back pain without sciatica -     ibuprofen (ADVIL) 600 MG tablet; Take 1 tablet (  600 mg total) by mouth every 8 (eight) hours as needed for moderate pain.  Generalized anxiety disorder Anxiety increase BuSpar to 7.53 times a day current dose not as effective will reevaluate -     busPIRone (BUSPAR) 7.5 MG tablet; Take 1 tablet (7.5 mg total) by mouth 3 (three) times daily.  Right wrist pain Negative for Tinel and Phalen's test pain in risk secondary to repetitious work.  Suggested a wrist band  to support her hand.  Reviewed different types on the computer to allow her to know the different types and what would be the best support   Grayce Sessions, NP 8:52 AM

## 2021-06-19 NOTE — Progress Notes (Signed)
Pain in hand and heel of foot

## 2021-06-19 NOTE — Patient Instructions (Signed)
Espoln calcneo [place] Los espolones calcneos son protuberancias seas que se forman en la parte inferior del hueso del taln (calcneo). Los espolones calcneos son frecuentes. A menudo causan inflamacin en fascia plantar, que es la banda de tejido que [employer] dedos con el hueso del taln. Cuando la fascia plantar se inflama, se denomina fascitis plantar. Esto puede causar dolor en la zona inferior del pie, cerca del taln. Muchas personas con fascitis plantar tambin presentan espolones calcneos. Sin embargo, los espolones no son la causa del dolor de la fascitis plantar. Cules son las causas? Se desconoce la causa exacta de los espolones calcneos. Las [employer] ser las siguientes: Presin sobre el hueso del taln. Bandas de tejido que conectan el msculo al hueso (tendones) que tiran del hueso del taln. Qu incrementa el riesgo? Es ms probable que desarrolle esta afeccin si: Tiene ms de 40 aos de edad. Tiene sobrepeso. Padece artritis debido al deterioro (artrosis). Presenta inflamacin en la fascia plantar. Participa en deportes o actividades que incluyen correr o Product/process development scientist. [place] zapatos que no tienen buen calce. Cules son los signos o sntomas? Algunas personas no tienen sntomas. Si tiene sntomas, pueden incluir los siguientes: Dolor en la parte inferior del taln. Dolor que empeora al levantarse de la cama por primera vez. Dolor que empeora despus de caminar o ponerse de pie. Cmo se diagnostica? Esta afeccin se puede diagnosticar en funcin de lo siguiente: Los sntomas y los antecedentes mdicos. Un examen fsico. Una radiografa del pie. Cmo se trata? El tratamiento de esta afeccin depende de cunto dolor sienta. Las opciones de tratamiento pueden incluir: Hacer ejercicios de estiramiento y perder peso, si es necesario. Usar calzados o plantillas especficos dentro del calzado (aparatos ortopdicos) para sentir comodidad y apoyo. Usar frulas  en los pies mientras duerme. Las frulas [employer] pies en una posicin (generalmente un ngulo de 90 grados) que debe prevenir y Engineer, materials que siente al levantarse de la cama por primera vez. Adems, facilitan el estiramiento en la maana. Tomar medicamentos de venta libre para Engineer, materials, como antiinflamatorios no esteroideos ([place]). Utilizar ondas sonoras de alta intensidad para fragmentar el espoln calcneo (terapia extracorprea por ondas de choque). Recibir inyecciones de corticoesteroides en el taln para reducir la inflamacin. Someterse a Transport planner calcneo causa dolor a Air cabin crew (crnico). Siga estas instrucciones en su casa: Actividad Evite las actividades que le causen dolor hasta que se recupere o durante el tiempo que le haya dicho el mdico. Haga los ejercicios de estiramiento como se le ha indicado. Elongue antes de hacer ejercicio o actividad fsica. Control del dolor, la rigidez y la hinchazn Si se lo indican, aplquese hielo [employer]. Para hacer esto: Ponga el hielo en una bolsa plstica. Coloque una toalla entre la piel y Copy. Aplique el hielo durante 20 minutos, 2 o 3 veces por da. Retire el hielo si la piel se pone de color rojo brillante. Esto es [employer]. Si no puede sentir dolor, calor o fro, tiene un mayor riesgo de que se dae la zona. Mueva los dedos del pie con frecuencia para reducir la rigidez y la hinchazn. Cuando sea posible, levante (eleve) el pie por encima del nivel del corazn mientras est sentado o acostado. Instrucciones generales Use los medicamentos de venta libre y los recetados solamente como se lo haya indicado el mdico. Use zapatos con buen apoyo y buen calce. Use las frulas, plantillas o aparatos ortopdicos como se  lo haya indicado el mdico. Si se lo recomiendan, consulte a su mdico para bajar de peso. Esto puede aliviar la presin en el pie. No consuma ningn producto que contenga nicotina o  tabaco, como cigarrillos, cigarrillos electrnicos y tabaco de Theatre manager. Estos pueden retrasar la consolidacin del [place]. Si necesita ayuda para dejar de fumar, consulte al mdico. Cumpla con todas las visitas de seguimiento. Esto es importante. Dnde buscar ms informacin American Academy of Orthopaedic Surgeons (Academia Estadounidense de Cirujanos Ortopdicos): www.orthoinfo.aaos.org Comunquese con un mdico si: El dolor no desaparece con Scientist, research (medical). El dolor [place]. Resumen Los espolones calcneos son protuberancias seas que se forman en la parte inferior del hueso del taln (calcneo). Los espolones calcneos suelen causar inflamacin en la fascia plantar, que es la banda de tejido que [employer] dedos del pie con el hueso del taln. Esto puede causar dolor en la zona inferior del pie, cerca del taln. Hacer ejercicios de elongacin, bajar de peso, usar zapatos o Catering manager, usar frulas [employer]	[place] duerme y tomar analgsicos puede Engineer, materials y la rigidez. Otras opciones de tratamiento pueden incluir ondas sonoras de alta intensidad para fragmentar el espoln calcneo, inyecciones con corticoesteroides y [place]. Esta informacin no tiene Theme park manager el consejo del mdico. Asegrese de hacerle al mdico cualquier pregunta que tenga. Document Revised: 02/13/2020 Document Reviewed: 02/13/2020 Elsevier Patient Education  2022 Elsevier Inc. Fascitis plantar Plantar Fasciitis La fascitis plantar es una afeccin dolorosa que se produce en el taln. Ocurre cuando la banda de tejido que [employer] dedos con el hueso del taln (fascia plantar) se irrita. Esto puede ocurrir por Academic librarian ejercicio u otras actividades repetitivas (lesin por uso excesivo). La fascitis plantar puede causar desde una leve irritacin hasta dolor intenso que dificulta que la persona camine o se [place]. Por lo general, el dolor es peor a la maana despus de dormir, o despus de Personal assistant  sentado o acostado durante un perodo de [place]. El dolor tambin puede empeorar despus de caminar o estar de pie por [employer]. Cules son las causas? Esta afeccin puede ser causada por lo siguiente: Estar de pie durante largos perodos. Usar zapatos que no tengan un buen soporte para el arco. Realizar actividades que implican esfuerzo para las articulaciones (actividades de alto impacto). Esto incluye el ballet y la actividad fsica que hace que el corazn lata ms rpido (ejercicio aerbico), Licensed conveyancer. Tener sobrepeso. Tener una forma de caminar (andar) anormal. Presentar rigidez muscular en la parte posterior de la parte inferior de la pierna (pantorrilla). Arcos [employer] o pies planos. Comenzar una nueva actividad fsica. Cules son los signos o sntomas? El sntoma principal de esta afeccin es el dolor en el taln. El dolor puede empeorar despus de lo siguiente: Con los primeros pasos luego de estar en reposo, especialmente por la maana despus de dormir o de haber estado sentado o acostado durante un [place]. Largos perodos de Personal assistant de pie. El dolor puede disminuir despus de 30 a 45 minutos de [place], como caminar apaciblemente. Cmo se diagnostica? Esta afeccin se puede diagnosticar en funcin de los antecedentes mdicos, un examen fsico y los sntomas. El mdico controlar lo siguiente: Un rea dolorida en la parte inferior del pie. [place] alto en el pie o pies planos. Dolor al Doctor, general practice. Dificultad para mover el pie. Pueden realizarle estudios de diagnstico por imagen para confirmar el diagnstico, por ejemplo: Radiografas. Ecografa. Resonancia magntica (RM). Cmo se trata? El tratamiento de la  fascitis plantar depende de la gravedad de su afeccin. El tratamiento puede incluir: Reposo, hielo, presin (compresin) y Lexicographer (elevar) el pie afectado. Esto se denomina tratamiento de RHCE (reposo, hielo, compresin, elevacin). El mdico  puede recomendarle terapia de RHCE junto con medicamentos de venta libre para Engineer, materials. Ejercicios para estirar las pantorrillas y la fascia plantar. Una frula que [employer] estirado y [place] mientras usted duerme (frula nocturna). Fisioterapia para [employer] sntomas y Physiological scientist en el futuro. Inyecciones de medicamentos con corticoesteroides (cortisona) para Engineer, materials y la inflamacin. Estimular su fascia plantar lesionada con impulsos elctricos (tratamiento con ondas de choque extracorpreas). Esto generalmente es la ltima opcin de tratamiento antes de la [place]. [place], si los otros tratamientos no han funcionado despus de 12 meses. Siga estas instrucciones en su casa: Control del dolor, la rigidez y la hinchazn  Si se lo indican, aplique hielo sobre la zona dolorida. Para hacer esto: Ponga el hielo en una bolsa de plstico o use una botella de [place]. Coloque una toalla entre la piel y la bolsa de hielo o la botella. Frote la parte inferior del pie sobre la bolsa o la botella. Haga esto durante 20 minutos, de 2 a 3 veces al [employer]. Use calzado deportivo con amortiguacin de aire o gel, o pruebe usar plantillas blandas diseadas para la fascitis plantar. Cuando est sentado o acostado, eleve el pie por encima del nivel del corazn. Actividad Evite las actividades que le causan dolor. Pregntele al mdico qu actividades son seguras para usted. Haga los ejercicios de fisioterapia y estiramiento como se lo haya indicado el mdico. Intente hacer actividades y tipos de ejercicio que sean ms suaves para las articulaciones (de bajo impacto). Por ejemplo, nadar, hacer ejercicios [employer], y Lobbyist. Instrucciones generales Use los medicamentos de venta libre y los recetados solamente como se lo haya indicado el mdico. Si el mdico se lo indica, use una frula nocturna para dormir. Afloje la frula si los dedos de los pies  se le entumecen, siente hormigueos o se le enfran y se tornan de Research officer, trade union. Mantenga un peso saludable, o colabore con su mdico para perder [employer]. Cumpla con todas las visitas de seguimiento. Esto es importante. Comunquese con un mdico si tiene: Sntomas que no desaparecen con Environmental consultant. Dolor que [place]. Dolor que afecta su capacidad de moverse o de Education officer, environmental sus actividades diarias. Resumen La fascitis plantar es una afeccin dolorosa que se produce en el taln. Ocurre cuando la banda de tejido que [employer] dedos con el hueso del taln (fascia plantar) se irrita. El dolor en el taln es el sntoma principal de esta afeccin. Puede empeorar despus de Research scientist (medical) ejercicio o de Personal assistant quieto de pie durante mucho tiempo. El tratamiento vara, pero normalmente comienza con el reposo, la aplicacin de hielo, la aplicacin de presin (compresin) y la elevacin del pie afectado. Esto se denomina tratamiento de RHCE (reposo, hielo, compresin, elevacin). Tambin se pueden usar analgsicos de venta libre para Human resources officer. Esta informacin no tiene Theme park manager el consejo del mdico. Asegrese de hacerle al mdico cualquier pregunta que tenga. Document Revised: 02/19/2020 Document Reviewed: 02/19/2020 Elsevier Patient Education  2022 [employer].

## 2021-08-19 ENCOUNTER — Other Ambulatory Visit: Payer: Self-pay

## 2021-08-19 ENCOUNTER — Encounter (INDEPENDENT_AMBULATORY_CARE_PROVIDER_SITE_OTHER): Payer: Self-pay | Admitting: Primary Care

## 2021-08-19 ENCOUNTER — Ambulatory Visit (INDEPENDENT_AMBULATORY_CARE_PROVIDER_SITE_OTHER): Payer: Self-pay | Admitting: Primary Care

## 2021-08-19 DIAGNOSIS — G8929 Other chronic pain: Secondary | ICD-10-CM

## 2021-08-19 DIAGNOSIS — F411 Generalized anxiety disorder: Secondary | ICD-10-CM

## 2021-08-19 DIAGNOSIS — M722 Plantar fascial fibromatosis: Secondary | ICD-10-CM

## 2021-08-19 DIAGNOSIS — M545 Low back pain, unspecified: Secondary | ICD-10-CM

## 2021-08-19 MED ORDER — IBUPROFEN 600 MG PO TABS
600.0000 mg | ORAL_TABLET | Freq: Three times a day (TID) | ORAL | 1 refills | Status: DC | PRN
  Filled 2021-08-19: qty 90, 30d supply, fill #0

## 2021-08-19 MED ORDER — BUSPIRONE HCL 7.5 MG PO TABS
7.5000 mg | ORAL_TABLET | Freq: Three times a day (TID) | ORAL | 2 refills | Status: DC
  Filled 2021-08-19: qty 90, 30d supply, fill #0

## 2021-08-19 MED ORDER — METHOCARBAMOL 500 MG PO TABS
ORAL_TABLET | Freq: Three times a day (TID) | ORAL | 1 refills | Status: DC | PRN
  Filled 2021-08-19 – 2021-12-04 (×2): qty 90, 30d supply, fill #0

## 2021-08-19 MED FILL — Norgestimate-Eth Estrad Tab 0.18-35/0.215-35/0.25-35 MG-MCG: ORAL | 28 days supply | Qty: 28 | Fill #1 | Status: AC

## 2021-08-19 NOTE — Progress Notes (Signed)
Foot and back pain. Foot pain is radiating from arch of foot to ankle

## 2021-08-19 NOTE — Progress Notes (Signed)
Monica Peters, is a 35 y.o. female  FVC:[id number]  MBW:[id number]  DOB - 14-Nov-1985  Chief Complaint  Patient presents with   medication effectiveness       Subjective:   Monica Peters is a 35 y.o. female here today for a follow up visit medication adjustment. She is doing well on Buspar definitely can tell the difference. She has been having lower back pain intermittent 5/10 -aggravating factor of the body twisting using vacuuming, when she wakes up toss and turns in sleep. Alleviating factors ibuprofen . Discussed as needed can cause kidney damage. Left foot plantar fascitis.  Patient has No headache, No chest pain, No abdominal pain - No Nausea, No new weakness tingling or numbness, No Cough - SOB.  No problems updated.  ALLERGIES: No Known Allergies  PAST MEDICAL HISTORY: No past medical history on file.  MEDICATIONS AT HOME: Prior to Admission medications   Medication Sig Start Date End Date Taking? Authorizing Provider  albuterol (VENTOLIN HFA) 108 (90 Base) MCG/ACT inhaler Inhale 2 puffs into the lungs every 6 (six) hours as needed for wheezing or shortness of breath. 04/12/20  Yes Grayce Sessions, NP  ibuprofen (ADVIL) 600 MG tablet Take 1 tablet (600 mg total) by mouth every 8 (eight) hours as needed for moderate pain. 06/19/21  Yes Grayce Sessions, NP  methocarbamol (ROBAXIN) 500 MG tablet TAKE 1 TABLET (500 MG TOTAL) BY MOUTH EVERY 8 (EIGHT) HOURS AS NEEDED FOR MUSCLE SPASMS. 11/05/20 11/05/21 Yes Grayce Sessions, NP  Norgestimate-Ethinyl Estradiol Triphasic 0.18/0.215/0.25 MG-35 MCG tablet TAKE 1 TABLET BY MOUTH DAILY. 11/07/20 11/07/21 Yes Adam Phenix, MD  busPIRone (BUSPAR) 7.5 MG tablet Take 1 tablet (7.5 mg total) by mouth 3 (three) times daily. 08/19/21   Grayce Sessions, NP  omeprazole (PRILOSEC) 20 MG capsule Take 1 capsule (20 mg total) by mouth daily. Patient not taking: Reported on 06/19/2021 09/04/19   Grayce Sessions, NP   gabapentin (NEURONTIN) 300 MG capsule Take 1 capsule (300 mg total) by mouth 3 (three) times daily. 07/11/20 11/05/20  Grayce Sessions, NP  levonorgestrel-ethinyl estradiol (NORDETTE) 0.15-30 MG-MCG tablet Take 1 tablet by mouth daily. Patient not taking: Reported on 11/07/2020 06/05/20 11/07/20  Grayce Sessions, NP    Objective:   Vitals:   08/19/21 1032  BP: 118/80  Pulse: 63  Temp: (!) 97.2 F (36.2 C)  TempSrc: Temporal  SpO2: 98%  Weight: 227 lb 9.6 oz (103.2 kg)  Height: 5\' 3"  (1.6 m)   Exam General appearance : Awake, alert, not in any distress. Speech Clear. Not toxic looking HEENT: Atraumatic and Normocephalic, pupils equally reactive to light and accomodation Neck: Supple, no JVD. No cervical lymphadenopathy.  Chest: Good air entry bilaterally, no added sounds  CVS: S1 S2 regular, no murmurs.  Abdomen: Bowel sounds present, Non tender and not distended with no gaurding, rigidity or rebound. Extremities: B/L Lower Ext shows no edema, both legs are warm to touch Neurology: Awake alert, and oriented X 3, CN II-XII intact, Non focal Skin: No Rash  Data Review No results found for: HGBA1C  Assessment & Plan  Levy was seen today for medication effectiveness.  Diagnoses and all orders for this visit:  Generalized anxiety disorder -     busPIRone (BUSPAR) 7.5 MG tablet; Take 1 tablet (7.5 mg total) by mouth 3 (three) times daily.  Chronic bilateral low back pain without sciatica -     ibuprofen (ADVIL) 600 MG tablet; Take  1 tablet (600 mg total) by mouth every 8 (eight) hours as needed for moderate pain. -     methocarbamol (ROBAXIN) 500 MG tablet; TAKE 1 TABLET (500 MG TOTAL) BY MOUTH EVERY 8 (EIGHT) HOURS AS NEEDED FOR MUSCLE SPASMS. -     AMB referral to orthopedics  Plantar fasciitis of right foot -     ibuprofen (ADVIL) 600 MG tablet; Take 1 tablet (600 mg total) by mouth every 8 (eight) hours as needed for moderate pain. -     AMB referral to  orthopedics  Patient have been counseled extensively about nutrition and exercise. Other issues discussed during this visit include: low cholesterol diet, weight control and daily exercise, foot care, annual eye examinations at Ophthalmology, importance of adherence with medications and regular follow-up. We also discussed long term complications of uncontrolled diabetes and hypertension.   Return in about 6 months (around 02/17/2022) for cmp/lipid f/u anxiety .  The patient was given clear instructions to go to ER or return to medical center if symptoms don't improve, worsen or new problems develop. The patient verbalized understanding. The patient was told to call to get lab results if they haven't heard anything in the next week.   This note has been created with Education officer, environmental. Any transcriptional errors are unintentional.    Grayce Sessions,  08/19/2021, 11:06 AM

## 2021-08-19 NOTE — Patient Instructions (Addendum)
Clculos renales Kidney Stones Los clculos renales son masas parecidas a piedras que se forman en el interior de los riones. Los riones son los rganos que producen el pis (la [place]). Un clculo renal puede desplazarse hacia otras partes de las vas urinarias, por ejemplo: Los conductos que [employer] riones con la vejiga (urteres). La vejiga. El conducto que lleva la orina hacia fuera del cuerpo (uretra). Los clculos renales puede provocar un dolor muy intenso y obstruir el paso del pis. Por lo general, el clculo sale del cuerpo (se elimina) a travs del pis. Es posible que un mdico deba extraerle el clculo. Cules son las causas? Los clculos renales pueden ser causados por lo siguiente: Un trastorno que hace que ciertas glndulas produzcan demasiada hormona paratiroidea (hiperparatiroidismo primario). La acumulacin de un tipo de cristales en la vejiga formados por una sustancia qumica llamada cido rico. El cuerpo produce cido rico cuando se consumen ciertos alimentos. Estrechamiento (estenosis) de un urter o de ambos. Una obstruccin en los riones que existe desde el nacimiento. Una ciruga realizada en el rin o los urteres, como una ciruga de bypass gstrico. Qu incrementa el riesgo? Es ms probable que contraiga esta afeccin si: Ya tuvo clculos renales anteriormente. Tiene antecedentes familiares de clculos renales. No bebe suficiente agua. Sigue una dieta rica en protenas, sal (sodio) o azcar. Tiene sobrepeso (es obeso). Cules son los signos o los sntomas? Los sntomas de clculos renales pueden incluir los siguientes: Dolor en el costado del abdomen, justo debajo de las costillas (dolor en la fosa lumbar). Dolor que generalmente se expande (irradia) hacia la ingle. Necesidad frecuente o inmediata (urgente) de hacer pis. Dolor al hacer pis (orinar). Sangre en el pis (hematuria). Sensacin de que va a vomitar (nuseas). Vmitos. Grant Ruts y  escalofros. Cmo se trata? El tratamiento depende del tamao, la ubicacin y la composicin de los clculos renales. Los clculos renales suelen salir del cuerpo a travs del pis. Puede ser necesario hacer lo siguiente: Beber ms lquido para ayudar a Architectural technologist. En algunos casos, pueden administrarle lquidos por un tubo (catter) intravenoso colocado en una de sus venas en el hospital. Tomar analgsicos. Hacer cambios en su alimentacin para ayudar a prevenir que los clculos renales regresen. A veces, se necesitan procedimientos mdicos para extraer un clculo renal. Esto puede incluir lo siguiente: Un procedimiento para romper los clculos renales utilizando un haz de luz (lser) u ondas de choque. [place]uga para extraer los clculos renales. Siga estas instrucciones en su casa: Medicamentos Use los medicamentos de venta libre y los recetados solamente como se lo haya indicado el mdico. Pregntele al mdico si el medicamento recetado le impide conducir o usar maquinaria pesada. Comida y bebida Beba suficiente lquido como para Pharmacologist la orina de color amarillo plido. Es posible que le digan que beba al [employer] 8 y 10 vasos de agua por Futures trader. Esto lo ayudar a eliminar el clculo renal. Si el mdico se lo indica, haga cambios en su alimentacin. Esto puede incluir: Limitar la cantidad de sal que consume. Comer ms frutas y verduras. Limitar la ingesta de carne de res, carne de ave, pescado y huevos. Siga las instrucciones del mdico respecto de las restricciones en las comidas o las bebidas. Instrucciones generales Recolecte las muestras de pis como se lo haya indicado el mdico. Deber recolectar una muestra de pis en los siguientes momentos: 24 horas despus de eliminar un clculo. Entre 8 y 12 semanas despus de haber eliminado el clculo, y  cada 6 a 12 meses despus de eso. Cuele el pis cada vez que haga pis (orine) durante el tiempo que le indiquen. Use el colador que  le recomiende el mdico. No deseche el clculo. Consrvelo para que el mdico lo pueda Physiological scientist. Concurra a todas las visitas de seguimiento como se lo haya indicado el mdico. Esto es importante. Es posible que deba realizarse exmenes de seguimiento. Cmo se previene? Para prevenir la formacin de otro clculo renal: Beba suficiente lquido como para mantener la orina de color amarillo plido. Esta es la mejor manera de prevenir la formacin de clculos renales. Consuma alimentos saludables. Evite determinados alimentos segn se lo haya indicado el mdico. Es posible que le indiquen que consuma menos protenas. Mantenga un peso saludable. Dnde buscar ms informacin [employer]) ([place]): www.kidney.[facility] [facility]) (Fundacin de Cuidados Urolgicos): www.urologyhealth.org Comunquese con un mdico si: Tiene un dolor que empeora o que no mejora con los medicamentos. Solicite ayuda de inmediato si: Tiene fiebre o escalofros. Siente un dolor muy intenso. Tiene un dolor nuevo en el vientre (abdomen). Pierde el conocimiento (se desmaya). No puede hacer pis. Resumen Los clculos renales son masas parecidas a piedras que se forman en el interior de los riones. Los clculos renales puede provocar un dolor muy intenso y obstruir el paso del pis. Los clculos renales suelen salir del cuerpo a travs del pis. Beba suficiente lquido como para Theatre manager la orina de color amarillo plido. Esta informacin no tiene Marine scientist el consejo del mdico. Asegrese de hacerle al mdico cualquier pregunta que tenga. Document Revised: 05/14/2021 Document Reviewed: 05/14/2021 Elsevier Patient Education  2022 [place] con hierro, vitamina D, calcio y b12

## 2021-09-11 ENCOUNTER — Ambulatory Visit (INDEPENDENT_AMBULATORY_CARE_PROVIDER_SITE_OTHER): Payer: Self-pay

## 2021-09-11 ENCOUNTER — Other Ambulatory Visit: Payer: Self-pay

## 2021-09-11 ENCOUNTER — Ambulatory Visit (INDEPENDENT_AMBULATORY_CARE_PROVIDER_SITE_OTHER): Payer: Self-pay | Admitting: Orthopedic Surgery

## 2021-09-11 DIAGNOSIS — M25572 Pain in left ankle and joints of left foot: Secondary | ICD-10-CM

## 2021-09-11 DIAGNOSIS — M545 Low back pain, unspecified: Secondary | ICD-10-CM

## 2021-09-11 DIAGNOSIS — G8929 Other chronic pain: Secondary | ICD-10-CM

## 2021-09-11 MED ORDER — PREDNISONE 10 MG PO TABS
10.0000 mg | ORAL_TABLET | Freq: Every day | ORAL | 0 refills | Status: DC
  Filled 2021-09-11: qty 30, 30d supply, fill #0

## 2021-09-11 NOTE — Progress Notes (Signed)
Office Visit Note   Patient: Monica Peters           Date of Birth: [birth year]           MRN: [id number] Visit Date: 09/11/2021              Requested by: Grayce Sessions, NP [address] [place],  [place] [address] PCP: Grayce Sessions, NP  Chief Complaint  Patient presents with   Left Foot - Pain   Left Ankle - Pain   Lower Back - Pain      HPI: Patient is a 36 year old woman who presents complaining of anterior ankle pain over the medial joint line.  She also complains of lower back pain on the right side without radicular symptoms.  She states she has difficulty with standing and with using a vacuum.  She has tried Robaxin and Advil.  Assessment & Plan: Visit Diagnoses:  1. Chronic right-sided low back pain without sciatica   2. Pain in left ankle and joints of left foot     Plan: Patient has symptomatic posterior tibial tendon insufficiency.  We will place her in a cam walker follow-up in 4 weeks.  Prescription for prednisone for her back.  Follow-Up Instructions: No follow-ups on file.   Ortho Exam  Patient is alert, oriented, no adenopathy, well-dressed, normal affect, normal respiratory effort. Examination patient is a good dorsalis pedis pulse she has pain to palpation of the sinus Tarsi she has pronation and valgus of the forefoot with a positive too many toes sign.  She cannot do a single limb heel raise she has pain to palpation over the posterior tibial tendon.  Patient has a negative straight leg raise and no focal motor weakness.  Imaging: No results found. No images are attached to the encounter.  Labs: No results found for: HGBA1C, ESRSEDRATE, CRP, LABURIC, REPTSTATUS, GRAMSTAIN, CULT, LABORGA   Lab Results  Component Value Date   ALBUMIN 4.7 05/08/2021   ALBUMIN 4.6 09/10/2020    No results found for: MG No results found for: VD25OH  No results found for: PREALBUMIN CBC EXTENDED Latest Ref Rng & Units 09/10/2020 04/24/2020   WBC 3.4 - 10.8 x10E3/uL 5.1 4.8  RBC 3.77 - 5.28 x10E6/uL 4.51 4.69  HGB 11.1 - 15.9 g/dL 93.8 18.2  HCT 99.3 - 71.6 % 40.5 42.9  PLT 150 - 450 x10E3/uL 291 249  NEUTROABS 1.4 - 7.0 x10E3/uL 2.2 2.2  LYMPHSABS 0.7 - 3.1 x10E3/uL 2.3 2.2     There is no height or weight on file to calculate BMI.  Orders:  Orders Placed This Encounter  Procedures   XR Lumbar Spine 2-3 Views   XR Ankle 2 Views Left   XR Foot 2 Views Left   Meds ordered this encounter  Medications   predniSONE (DELTASONE) 10 MG tablet    Sig: Take 1 tablet (10 mg total) by mouth daily with breakfast.    Dispense:  30 tablet    Refill:  0     Procedures: No procedures performed  Clinical Data: No additional findings.  ROS:  All other systems negative, except as noted in the HPI. Review of Systems  Objective: Vital Signs: There were no vitals taken for this visit.  Specialty Comments:  No specialty comments available.  PMFS History: Patient Active Problem List   Diagnosis Date Noted   Abnormal uterine bleeding (AUB) 09/19/2020   Viral upper respiratory tract infection 08/11/2020   Nausea 08/11/2020  Dyspnea 05/14/2020   History reviewed. No pertinent past medical history.  History reviewed. No pertinent family history.  Past Surgical History:  Procedure Laterality Date   CHOLECYSTECTOMY  2021   Social History   Occupational History   Not on file  Tobacco Use   Smoking status: Never   Smokeless tobacco: Never  Substance and Sexual Activity   Alcohol use: Never   Drug use: Never   Sexual activity: Yes    Birth control/protection: Pill

## 2021-09-12 ENCOUNTER — Encounter: Payer: Self-pay | Admitting: Orthopedic Surgery

## 2021-09-29 ENCOUNTER — Ambulatory Visit: Payer: Self-pay | Attending: Primary Care

## 2021-09-29 ENCOUNTER — Other Ambulatory Visit: Payer: Self-pay

## 2021-10-09 ENCOUNTER — Other Ambulatory Visit: Payer: Self-pay

## 2021-10-09 ENCOUNTER — Ambulatory Visit (INDEPENDENT_AMBULATORY_CARE_PROVIDER_SITE_OTHER): Payer: Self-pay | Admitting: Orthopedic Surgery

## 2021-10-09 DIAGNOSIS — G8929 Other chronic pain: Secondary | ICD-10-CM

## 2021-10-09 DIAGNOSIS — M25572 Pain in left ankle and joints of left foot: Secondary | ICD-10-CM

## 2021-10-09 DIAGNOSIS — M545 Low back pain, unspecified: Secondary | ICD-10-CM

## 2021-10-12 ENCOUNTER — Encounter: Payer: Self-pay | Admitting: Orthopedic Surgery

## 2021-10-12 NOTE — Progress Notes (Signed)
Office Visit Note   Patient: Monica Peters           Date of Birth: September 29, 1985           MRN: [id number] Visit Date: 10/09/2021              Requested by: Grayce Sessions, NP [address] [place],  [place] [address] PCP: Grayce Sessions, NP  Chief Complaint  Patient presents with   Lower Back - Follow-up   Left Ankle - Follow-up      HPI: Patient is a 36 year old woman who presents for 2 separate issues.  #1 lower back pain currently treated with prednisone.  #2 posterior tibial tendon insufficiency on the left currently in a fracture boot.  Patient states the back feels much better.  She states the pain is improving after using the boot but still present.  Assessment & Plan: Visit Diagnoses:  1. Chronic right-sided low back pain without sciatica   2. Pain in left ankle and joints of left foot     Plan: Recommended continue with the fracture boot recommend Voltaren gel over the posterior tibial tendon.  Follow-Up Instructions: Return in about 4 weeks (around 11/06/2021).   Ortho Exam  Patient is alert, oriented, no adenopathy, well-dressed, normal affect, normal respiratory effort. Examination patient has no sciatic tension signs no focal motor weakness her back is much better.  Patient still has some pain over the posterior tibial tendon and has some tenderness over the peroneal tendons as well she has good ankle and subtalar motion.  Good inversion and eversion strength.  Imaging: No results found. No images are attached to the encounter.  Labs: No results found for: HGBA1C, ESRSEDRATE, CRP, LABURIC, REPTSTATUS, GRAMSTAIN, CULT, LABORGA   Lab Results  Component Value Date   ALBUMIN 4.7 05/08/2021   ALBUMIN 4.6 09/10/2020    No results found for: MG No results found for: VD25OH  No results found for: PREALBUMIN CBC EXTENDED Latest Ref Rng & Units 09/10/2020 04/24/2020  WBC 3.4 - 10.8 x10E3/uL 5.1 4.8  RBC 3.77 - 5.28 x10E6/uL 4.51 4.69  HGB  11.1 - 15.9 g/dL 69.6 29.5  HCT 28.4 - 13.2 % 40.5 42.9  PLT 150 - 450 x10E3/uL 291 249  NEUTROABS 1.4 - 7.0 x10E3/uL 2.2 2.2  LYMPHSABS 0.7 - 3.1 x10E3/uL 2.3 2.2     There is no height or weight on file to calculate BMI.  Orders:  No orders of the defined types were placed in this encounter.  No orders of the defined types were placed in this encounter.    Procedures: No procedures performed  Clinical Data: No additional findings.  ROS:  All other systems negative, except as noted in the HPI. Review of Systems  Objective: Vital Signs: There were no vitals taken for this visit.  Specialty Comments:  No specialty comments available.  PMFS History: Patient Active Problem List   Diagnosis Date Noted   Abnormal uterine bleeding (AUB) 09/19/2020   Viral upper respiratory tract infection 08/11/2020   Nausea 08/11/2020   Dyspnea 05/14/2020   History reviewed. No pertinent past medical history.  History reviewed. No pertinent family history.  Past Surgical History:  Procedure Laterality Date   CHOLECYSTECTOMY  2021   Social History   Occupational History   Not on file  Tobacco Use   Smoking status: Never   Smokeless tobacco: Never  Substance and Sexual Activity   Alcohol use: Never   Drug use: Never  Sexual activity: Yes    Birth control/protection: Pill

## 2021-11-03 ENCOUNTER — Ambulatory Visit (INDEPENDENT_AMBULATORY_CARE_PROVIDER_SITE_OTHER): Payer: Self-pay | Admitting: *Deleted

## 2021-11-03 NOTE — Telephone Encounter (Signed)
?  Chief Complaint: 3 blisters on arch of left foot ?Symptoms: 2 of the blisters are black, 1 white ?Frequency: For the last week ?Pertinent Negatives: Patient denies drainage.   Not sure how they got there.  Not diabetic. ?Disposition: [] ED /[] Urgent Care (no appt availability in office) / [] Appointment(In office/virtual)/ []  [facility]/ [] Home Care/ [] Refused Recommended Disposition /[x] [facility]/ []  Follow-up with PCP ?Additional Notes: Given location of the Mobile [facility] for 3/7/02023.  Pt agreeable to going there tomorrow    I did encourage her to go to the urgent care of do a [facility] visit via MyChart if felt she needed to be seen sooner.   She verbalized understanding. ?

## 2021-11-03 NOTE — Telephone Encounter (Signed)
Reason for Disposition ? [1] Looks infected (spreading redness, red streak, pus) AND [2] no fever ? ?Answer Assessment - Initial Assessment Questions ?1. APPEARANCE of BLISTER: "What does it look like?" ?    Has 2 blisters on left foot on the bottom in the arch.     Total of 3 blisters.   2 are black.  The big one is white. ?2. SIZE: "How large is the blister?" (inches, cm or compare to coins) ?    3 blisters ?I have a problem with my tendon.   It hurts when I walk.   ?3. LOCATION: "Where are the blisters located?"  ?    Arch of left foot ?4. WHEN: "When did the blister happen?" ?    A week ?5. CAUSE: "What do you think caused the blister?" ?    I have a tendon problem.   ?6. PAIN: "Does it hurt?" If Yes, ask: "How bad is the pain?"  (Scale 1-10; or mild, moderate, severe) ?    Yes ?7. OTHER SYMPTOMS: "Do you have any other symptoms?" (e.g., fever) ?    2 of the blisters are black.  No drainage. ? ?Protocols used: Blister - Foot and Hand-A-AH ? ?

## 2021-11-04 ENCOUNTER — Ambulatory Visit: Payer: Self-pay | Admitting: Physician Assistant

## 2021-11-04 ENCOUNTER — Other Ambulatory Visit: Payer: Self-pay

## 2021-11-04 VITALS — BP 136/85 | HR 95 | Resp 18 | Ht 66.5 in | Wt 241.0 lb

## 2021-11-04 DIAGNOSIS — S90822A Blister (nonthermal), left foot, initial encounter: Secondary | ICD-10-CM

## 2021-11-04 NOTE — Progress Notes (Signed)
? ?Established Patient Office Visit ? ?Subjective:  ?Patient ID: Monica Peters, female    DOB: 07-04-1986  Age: 36 y.o. MRN: [id number] ? ?CC:  ?Chief Complaint  ?Patient presents with  ? Blister  ?  3 blisters on ankle under her boot. Pt reports 2 are black and one is white.  ? ? ?HPI ?Gibson Ramp states that she has been wearing a walking boot for the last 4 weeks due to a tendon issue in her left foot.  States that she is due to follow-up with orthopedics in 2 days.  Reports that she has been having pain on the sole inside of her foot due to obtaining blisters.  Reports that she has a large 1 currently, has previously had 2 others that have resolved on their own.  Reports that she has tried putting Vaseline on them without relief. ? ?History reviewed. No pertinent past medical history. ? ?Past Surgical History:  ?Procedure Laterality Date  ? CHOLECYSTECTOMY  2021  ? ? ?History reviewed. No pertinent family history. ? ?Social History  ? ?Socioeconomic History  ? Marital status: Single  ?  Spouse name: Not on file  ? Number of children: Not on file  ? Years of education: Not on file  ? Highest education level: Not on file  ?Occupational History  ? Not on file  ?Tobacco Use  ? Smoking status: Never  ? Smokeless tobacco: Never  ?Substance and Sexual Activity  ? Alcohol use: Never  ? Drug use: Never  ? Sexual activity: Yes  ?  Birth control/protection: Pill  ?Other Topics Concern  ? Not on file  ?Social History Narrative  ? Not on file  ? ?Social Determinants of Health  ? ?Financial Resource Strain: Not on file  ?Food Insecurity: Food Insecurity Present  ? Worried About Charity fundraiser in the Last Year: Sometimes true  ? Ran Out of Food in the Last Year: Sometimes true  ?Transportation Needs: Unmet Transportation Needs  ? Lack of Transportation (Medical): Yes  ? Lack of Transportation (Non-Medical): Yes  ?Physical Activity: Not on file  ?Stress: Not on file  ?Social Connections: Not on file   ?Intimate Partner Violence: Not on file  ? ? ?Outpatient Medications Prior to Visit  ?Medication Sig Dispense Refill  ? albuterol (VENTOLIN HFA) 108 (90 Base) MCG/ACT inhaler Inhale 2 puffs into the lungs every 6 (six) hours as needed for wheezing or shortness of breath. 8 g 0  ? busPIRone (BUSPAR) 7.5 MG tablet Take 1 tablet (7.5 mg total) by mouth 3 (three) times daily. 90 tablet 2  ? ibuprofen (ADVIL) 600 MG tablet Take 1 tablet (600 mg total) by mouth every 8 (eight) hours as needed for moderate pain. 90 tablet 1  ? methocarbamol (ROBAXIN) 500 MG tablet TAKE 1 TABLET (500 MG TOTAL) BY MOUTH EVERY 8 (EIGHT) HOURS AS NEEDED FOR MUSCLE SPASMS. 90 tablet 1  ? Norgestimate-Ethinyl Estradiol Triphasic 0.18/0.215/0.25 MG-35 MCG tablet TAKE 1 TABLET BY MOUTH DAILY. 28 tablet 11  ? predniSONE (DELTASONE) 10 MG tablet Take 1 tablet (10 mg total) by mouth daily with breakfast. 30 tablet 0  ? ?No facility-administered medications prior to visit.  ? ? ?No Known Allergies ? ?ROS ?Review of Systems  ?Constitutional: Negative.   ?HENT: Negative.    ?Eyes: Negative.   ?Respiratory:  Negative for shortness of breath.   ?Cardiovascular:  Negative for chest pain.  ?Gastrointestinal: Negative.   ?Endocrine: Negative.   ?Genitourinary: Negative.   ?  Musculoskeletal:  Positive for gait problem.  ?Skin: Negative.   ?Allergic/Immunologic: Negative.   ?Neurological:  Negative for headaches.  ?Hematological: Negative.   ?Psychiatric/Behavioral: Negative.    ? ?  ?Objective:  ?  ?Physical Exam ?Vitals and nursing note reviewed.  ?Constitutional:   ?   Appearance: Normal appearance.  ?HENT:  ?   Head: Normocephalic and atraumatic.  ?   Right Ear: External ear normal.  ?   Left Ear: External ear normal.  ?   Nose: Nose normal.  ?   Mouth/Throat:  ?   Mouth: Mucous membranes are moist.  ?   Pharynx: Oropharynx is clear.  ?Eyes:  ?   Extraocular Movements: Extraocular movements intact.  ?   Conjunctiva/sclera: Conjunctivae normal.  ?   Pupils:  Pupils are equal, round, and reactive to light.  ?Cardiovascular:  ?   Rate and Rhythm: Normal rate and regular rhythm.  ?   Pulses: Normal pulses.  ?   Heart sounds: Normal heart sounds.  ?Pulmonary:  ?   Effort: Pulmonary effort is normal.  ?   Breath sounds: Normal breath sounds.  ?Musculoskeletal:     ?   General: Normal range of motion.  ?   Cervical back: Normal range of motion and neck supple.  ?Skin: ?   General: Skin is warm and dry.  ?   Comments: See photo  ?Neurological:  ?   General: No focal deficit present.  ?   Mental Status: She is alert and oriented to person, place, and time.  ?Psychiatric:     ?   Mood and Affect: Mood normal.     ?   Behavior: Behavior normal.     ?   Thought Content: Thought content normal.     ?   Judgment: Judgment normal.  ? ?Verbal consent given by patient to have photo included in chart ? ? ?BP 136/85 (BP Location: Left Arm, Patient Position: Sitting, Cuff Size: Normal)   Pulse 95   Resp 18   Ht 5' 6.5" (1.689 m)   Wt 241 lb (109.3 kg)   SpO2 98%   BMI 38.32 kg/m?  ?Wt Readings from Last 3 Encounters:  ?11/04/21 241 lb (109.3 kg)  ?08/19/21 227 lb 9.6 oz (103.2 kg)  ?06/19/21 233 lb (105.7 kg)  ? ? ? ?Health Maintenance Due  ?Topic Date Due  ? COVID-19 Vaccine (2 - Booster for Janssen series) 04/05/2020  ? ? ?There are no preventive care reminders to display for this patient. ? ?No results found for: TSH ?Lab Results  ?Component Value Date  ? WBC 5.1 09/10/2020  ? HGB 13.8 09/10/2020  ? HCT 40.5 09/10/2020  ? MCV 90 09/10/2020  ? PLT 291 09/10/2020  ? ?Lab Results  ?Component Value Date  ? NA 138 05/08/2021  ? K 4.0 05/08/2021  ? CO2 22 05/08/2021  ? GLUCOSE 86 05/08/2021  ? BUN 7 05/08/2021  ? CREATININE 0.61 05/08/2021  ? BILITOT 0.4 05/08/2021  ? ALKPHOS 130 (H) 05/08/2021  ? AST 158 (H) 05/08/2021  ? ALT 272 (H) 05/08/2021  ? PROT 7.3 05/08/2021  ? ALBUMIN 4.7 05/08/2021  ? CALCIUM 10.7 (H) 05/08/2021  ? EGFR 118 05/08/2021  ? ?Lab Results  ?Component Value Date   ? CHOL 191 05/08/2021  ? ?Lab Results  ?Component Value Date  ? HDL 57 05/08/2021  ? ?Lab Results  ?Component Value Date  ? LDLCALC 120 (H) 05/08/2021  ? ?Lab Results  ?Component Value Date  ?  TRIG 75 05/08/2021  ? ?Lab Results  ?Component Value Date  ? CHOLHDL 3.4 05/08/2021  ? ?No results found for: HGBA1C ? ?  ?Assessment & Plan:  ? ?Problem List Items Addressed This Visit   ?None ?Visit Diagnoses   ? ? Friction blisters of sole of left foot, initial encounter    -  Primary  ? ?  ? ? ?No orders of the defined types were placed in this encounter. ?1. Friction blisters of sole of left foot, initial encounter ?Patient education given on supportive care, patient encouraged to maintain follow-up with orthopedics in 2 days.  Red flags given for prompt reevaluation ? ? ?I have reviewed the patient's medical history (PMH, PSH, Social History, Family History, Medications, and allergies) , and have been updated if relevant. I spent 20 minutes reviewing chart and  face to face time with patient. ? ? ? ? ?Follow-up: Return if symptoms worsen or fail to improve.  ? ? ?Lunette Tapp S Mayers, PA-C ?

## 2021-11-04 NOTE — Progress Notes (Signed)
Pt has eaten today and not taken medication. Pt reports 5/10 pain in left ankle due to blisters. ?

## 2021-11-04 NOTE — Patient Instructions (Addendum)
I encourage you to keep the area padded for comfort, clean and dry. ? ?Please let us know if there is anything else we can do for you. ? ?Kennieth Rad, PA-C ?Physician Assistant ?[facility] ?[email or web address] ? ? ?Blisters, Adult ?A blister is a raised bubble of skin filled with liquid. Blisters often develop on skin that rubs or presses against another surface repeatedly (friction blister). Friction blisters can occur on any part of the body, but they usually form on the hands or the feet. Long-term pressure on that same area of skin can also cause the area of skin to become hardened. This hardened skin is called a callus. ?What are the causes? ?Besides friction, blisters can be caused by: ?An injury, such as a burn. ?An allergic reaction. ?An infection. ?Exposure to irritating chemicals. ?Friction blisters often occur in areas with a lot of heat and moisture. Friction blisters often result from: ?Sports. ?Repetitive activities. ?Using tools and doing other activities without wearing gloves. ?Shoes that are too tight or too loose. ?What are the signs or symptoms? ?A blister is often round and looks like a bump. It may hurt or feel itchy. Before a blister forms, your skin may: ?Turn red. ?Feel warm. ?Itch. ?Be painful to the touch. ?How is this diagnosed? ?A blister is diagnosed with a physical exam. ?How is this treated? ?Treatment usually involves protecting the area where the blister has formed until your skin has healed. Treatments may include: ?Using a bandage (dressing) to cover your blister. ?Putting extra padding around and over your blister so that the blister does not rub on anything. ?Applying antibiotic ointment. ?Most blisters break open, dry up, and go away on their own within 1-2 weeks. Blisters that are very painful may be drained before they break open on their own. If your blister is large or painful, your health care provider can drain  it. ?Follow these instructions at home: ?Medicines ?Take or apply over-the-counter and prescription medicines only as told by your health care provider. ?If you were prescribed an antibiotic medicine, take or apply it as told by your health care provider. Do not stop using the antibiotic even if you start to feel better. ?Skin care ?Do not pop your blister. This can cause infection. ?Keep your blister clean and dry. This helps to prevent infection. ?Before you swim or use a hot tub, cover your blister with a waterproof dressing. ?Protect the area where your blister has formed as told by your health care provider. ?Follow instructions from your health care provider about how to take care of your blister. Make sure you: ?Wash your hands with soap and water for at least 20 seconds before and after you change your dressing. If soap and water are not available, use hand sanitizer. ?Change your dressing as told by your health care provider. ?Infection signs ?Check your blister every day for signs of infection. Check for: ?More redness, swelling, or pain. ?More fluid or blood. ?Warmth. ?Pus or a bad smell. ?General instructions ?If you have a blister on a foot or toe, wear different shoes until your blister heals. ?Avoid the activity that caused the blister until your blister heals. ?Keep all follow-up visits as told by your health care provider. This is important. ?How is this prevented? ?Taking these steps can help to prevent blisters that are caused by friction. Make sure you: ?Wear comfortable shoes that fit well. ?Always wear socks with shoes. ?Wear extra socks or use tape, dressings, or pads over  blister-prone areas as needed. You may also apply petroleum jelly under dressings in blister-prone areas. ?Wear protective gear, such as gloves, when taking part in sports or activities that can cause blisters. ?Wear loose-fitting, moisture-wicking clothes when taking part in sports or activities. ?Use powders as needed to  keep your feet dry. ?Contact a health care provider if: ?You have more redness, swelling, or pain around your blister. ?You have more fluid or blood coming from your blister. ?Your blister feels warm to the touch. ?You have pus or a bad smell coming from your blister. ?You have a fever or chills. ?Your blister gets better and then it gets worse. ?Summary ?A blister is a raised bubble of skin filled with liquid. Blisters often develop on skin that rubs or presses against another surface repeatedly (friction blister). ?Most blisters break open, dry up, and go away on their own within 1-2 weeks. ?Keep your blister clean and dry. This helps to prevent infection. ?Take steps to help prevent blisters that are caused by friction. ?Contact a health care provider if you have signs of infection. ?This information is not intended to replace advice given to you by your health care provider. Make sure you discuss any questions you have with your health care provider. ?Document Revised: 07/06/2019 Document Reviewed: 07/06/2019 ?Elsevier Patient Education ? [place]. ? ?

## 2021-11-06 ENCOUNTER — Ambulatory Visit (INDEPENDENT_AMBULATORY_CARE_PROVIDER_SITE_OTHER): Payer: Self-pay | Admitting: Orthopedic Surgery

## 2021-11-06 ENCOUNTER — Encounter: Payer: Self-pay | Admitting: Orthopedic Surgery

## 2021-11-06 DIAGNOSIS — M76822 Posterior tibial tendinitis, left leg: Secondary | ICD-10-CM

## 2021-11-06 NOTE — Progress Notes (Signed)
? ?Office Visit Note ?  ?Patient: Monica Peters           ?Date of Birth: 1986-01-01           ?MRN: [id number] ?Visit Date: 11/06/2021 ?             ?Requested by: Grayce Sessions, NP ?[address] ?[place],  [place] [address] ?PCP: Grayce Sessions, NP ? ?Chief Complaint  ?Patient presents with  ? Lower Back - Follow-up  ? Left Ankle - Follow-up  ? ? ? ? 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  Patient has completed her prednisone she states the lower back pain is much better she denies any radicular symptoms.  She states she is slightly better with the fracture boot for the posterior tibial tendon insufficiency. ? ?Assessment & Plan: ?Visit Diagnoses:  ?1. Insufficiency of left posterior tibial tendon   ? ? ?Plan: Will order an MRI scan to further evaluate the posterior tibial tendon on the left ankle.  She will continue with the fracture boot she does have a fungal rash on her foot I recommended discontinuing her cotton socks recommended will socks and recommended athlete's foot powder. ? ?Follow-Up Instructions: Return in about 4 weeks (around 12/04/2021).  ? ?Ortho Exam ? ?Patient is alert, oriented, no adenopathy, well-dressed, normal affect, normal respiratory effort. ?Examination patient has a good pulse she has good ankle and subtalar motion she has swelling over the posterior tibial tendon insertion and has pain to palpation at the insertion as well as the distal posterior tibial tendon.  She has a pronated valgus foot with pes planus.  She cannot do a single limb heel raise.  There is a negative straight leg raise no focal motor weakness. ? ?Imaging: ?No results found. ?No images are attached to the encounter. ? ?Labs: ?No results found for: HGBA1C, ESRSEDRATE, CRP, LABURIC, REPTSTATUS, GRAMSTAIN, CULT, LABORGA ? ? ?Lab Results   ?Component Value Date  ? ALBUMIN 4.7 05/08/2021  ? ALBUMIN 4.6 09/10/2020  ? ? ?No results found for: MG ?No results found for: VD25OH ? ?No results found for: PREALBUMIN ?CBC EXTENDED Latest Ref Rng & Units 09/10/2020 04/24/2020  ?WBC 3.4 - 10.8 x10E3/uL 5.1 4.8  ?RBC 3.77 - 5.28 x10E6/uL 4.51 4.69  ?HGB 11.1 - 15.9 g/dL 83.3 82.5  ?HCT 34.0 - 46.6 % 40.5 42.9  ?PLT 150 - 450 x10E3/uL 291 249  ?NEUTROABS 1.4 - 7.0 x10E3/uL 2.2 2.2  ?LYMPHSABS 0.7 - 3.1 x10E3/uL 2.3 2.2  ? ? ? ?There is no height or weight on file to calculate BMI. ? ?Orders:  ?Orders Placed This Encounter  ?Procedures  ? MR Ankle Left w/o contrast  ? ?No orders of the defined types were placed in this encounter. ? ? ? Procedures: ?No procedures performed ? ?Clinical Data: ?No additional findings. ? ?ROS: ? ?All other systems negative, except as noted in the HPI. ?Review of Systems ? ?Objective: ?Vital Signs: There were no vitals taken for this visit. ? ?Specialty Comments:  ?No specialty comments available. ? ?PMFS History: ?Patient Active Problem List  ? Diagnosis Date Noted  ? Abnormal uterine bleeding (AUB) 09/19/2020  ? Viral upper respiratory tract infection 08/11/2020  ? Nausea 08/11/2020  ? Dyspnea 05/14/2020  ? ?History reviewed. No pertinent past medical history.  ?History reviewed. No pertinent family history.  ?  Past Surgical History:  ?Procedure Laterality Date  ? CHOLECYSTECTOMY  2021  ? ?Social History  ? ?Occupational History  ? Not on file  ?Tobacco Use  ? Smoking status: Never  ? Smokeless tobacco: Never  ?Substance and Sexual Activity  ? Alcohol use: Never  ? Drug use: Never  ? Sexual activity: Yes  ?  Birth control/protection: Pill  ? ? ? ? ? ?

## 2021-11-15 ENCOUNTER — Ambulatory Visit
Admission: RE | Admit: 2021-11-15 | Discharge: 2021-11-15 | Disposition: A | Discharge status: Home / Self Care | Payer: No Typology Code available for payment source | Source: Ambulatory Visit | Attending: Orthopedic Surgery | Admitting: Orthopedic Surgery

## 2021-11-15 ENCOUNTER — Other Ambulatory Visit: Payer: Self-pay

## 2021-11-15 DIAGNOSIS — M76822 Posterior tibial tendinitis, left leg: Secondary | ICD-10-CM

## 2021-12-03 ENCOUNTER — Ambulatory Visit (INDEPENDENT_AMBULATORY_CARE_PROVIDER_SITE_OTHER): Payer: Self-pay | Admitting: *Deleted

## 2021-12-03 NOTE — Telephone Encounter (Signed)
?  Chief Complaint: ear drainage ?Symptoms: L ear drainage with odor, itching  ?Frequency: 1 week  ?Pertinent Negatives: Patient denies pain to ear ?Disposition: [] ED /[x] Urgent Care (no appt availability in office) / [] Appointment(In office/virtual)/ []  [place] [facility]/ [] Home Care/ [] Refused Recommended Disposition /[] [place] [facility]/ []  Follow-up with PCP ?Additional Notes: attempted to schedule with PCP or PCE but no appts available. Spoke with pt about doing [facility] appt or going to [facility] tomorrow after her morning appt with Ortho. Pt preferred UC. Appt was scheduled for 1000 at [facility].  ? ?Reason for Disposition ? Foul-smelling discharge ? ?Answer Assessment - Initial Assessment Questions ?1. LOCATION: "Which ear is involved?"  ?    L ear  ?2. COLOR: "What is the color of the discharge?"  ?    Yellow slight ?3. CONSISTENCY: "How runny is the discharge? Could it be water?"  ?    All the time ?4. ONSET: "When did you first notice the discharge?" ?    1 week ?5. PAIN: "Is there any earache?" "How bad is it?"  (Scale 1-10; or mild, moderate, severe) ?    0 ?6. OBJECTS: "Have you put anything in your ear?" (e.g., Q-tip, other object)  ?    Qtip with baby oil ?7. OTHER SYMPTOMS: "Do you have any other symptoms?" (e.g., headache, fever, dizziness, vomiting, runny nose) ?    Odor ? ?Protocols used: Ear - Discharge-A-AH ? ?

## 2021-12-03 NOTE — Telephone Encounter (Signed)
Summary: Itching ear/fluid discharge  ? Left ear itching and releasing a fluid, says this has happened for a week. Please advise  ? ?Best contact: [phone]  ? ?No appts available until May.   ?  ? ?No answer, voicemail left. ?

## 2021-12-04 ENCOUNTER — Ambulatory Visit (INDEPENDENT_AMBULATORY_CARE_PROVIDER_SITE_OTHER): Payer: Self-pay | Admitting: Orthopedic Surgery

## 2021-12-04 ENCOUNTER — Encounter: Payer: Self-pay | Admitting: Orthopedic Surgery

## 2021-12-04 ENCOUNTER — Telehealth: Payer: Self-pay

## 2021-12-04 ENCOUNTER — Other Ambulatory Visit: Payer: Self-pay

## 2021-12-04 DIAGNOSIS — M545 Low back pain, unspecified: Secondary | ICD-10-CM

## 2021-12-04 DIAGNOSIS — M76822 Posterior tibial tendinitis, left leg: Secondary | ICD-10-CM

## 2021-12-04 DIAGNOSIS — G8929 Other chronic pain: Secondary | ICD-10-CM

## 2021-12-04 MED ORDER — NABUMETONE 750 MG PO TABS
750.0000 mg | ORAL_TABLET | Freq: Two times a day (BID) | ORAL | 3 refills | Status: DC | PRN
Start: 2021-12-04 — End: 2022-01-01
  Filled 2021-12-04: qty 60, 30d supply, fill #0

## 2021-12-04 NOTE — Progress Notes (Addendum)
? ?Office Visit Note ?  ?Patient: Monica Peters           ?Date of Birth: [birth year]           ?MRN: [id number] ?Visit Date: 12/04/2021 ?             ?Requested by: Grayce Sessions, NP ?[address] ?[place],  [place] [address] ?PCP: Grayce Sessions, NP ? ?Chief Complaint  ?Patient presents with  ? Left Ankle - Follow-up  ?  MRI review   ? ? ? ? ?HPI: ?Patient is a 36 year old woman who presents in follow-up status MRI scan left ankle with inflammation at the insertion of the posterior tibial tendon.  Patient states that her lower back pain has reoccurred.  She states she is off all nonsteroidals at this time.  Patient states she has tried prednisone without relief. ? ?Assessment & Plan: ?Visit Diagnoses:  ?1. Insufficiency of left posterior tibial tendon   ?2. Chronic right-sided low back pain without sciatica   ? ? ?Plan: We will give her a new fracture boot continue activities as tolerated follow-up in 4 weeks. ? ?Patient called back requesting a anti-inflammatory.  We called her and will call in a prescription for Relafen to her pharmacy ? ?Follow-Up Instructions: Return in about 4 weeks (around 01/01/2022).  ? ?Ortho Exam ? ?Patient is alert, oriented, no adenopathy, well-dressed, normal affect, normal respiratory effort. ?Examination patient has no tenderness to palpation along the posterior tibial tendon but does have pain at the insertion over the accessory navicular.  She has a little bit of tenderness to palpation over the peroneal tendons.  She has a negative straight leg raise.  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 ? ?Imaging: ?No results found. ?No images are attached to the encounter. ? ?Labs: ?No results found for: HGBA1C, ESRSEDRATE, CRP, LABURIC, REPTSTATUS, GRAMSTAIN, CULT, LABORGA ? ? ?Lab Results  ?Component Value Date  ? ALBUMIN  4.7 05/08/2021  ? ALBUMIN 4.6 09/10/2020  ? ? ?No results found for: MG ?No results found for: VD25OH ? ?No results found for: PREALBUMIN ? ?  Latest Ref Rng & Units 09/10/2020  ? 10:21 AM 04/24/2020  ? 10:36 AM  ?CBC EXTENDED  ?WBC 3.4 - 10.8 x10E3/uL 5.1   4.8    ?RBC 3.77 - 5.28 x10E6/uL 4.51   4.69    ?Hemoglobin 11.1 - 15.9 g/dL 19.1   47.8    ?HCT 34.0 - 46.6 % 40.5   42.9    ?Platelets 150 - 450 x10E3/uL 291   249    ?NEUT# 1.4 - 7.0 x10E3/uL 2.2   2.2    ?Lymph# 0.7 - 3.1 x10E3/uL 2.3   2.2    ? ? ? ?There is no height or weight on file to calculate BMI. ? ?Orders:  ?No orders of the defined types were placed in this encounter. ? ?No orders of the defined types were placed in this encounter. ? ? ? Procedures: ?No procedures performed ? ?Clinical Data: ?No additional findings. ? ?ROS: ? ?All other systems negative, except as noted in the HPI. ?Review of Systems ? ?Objective: ?Vital Signs: There were no vitals taken for this visit. ? ?Specialty Comments:  ?No specialty comments available. ? ?PMFS History: ?Patient Active Problem List  ? Diagnosis Date Noted  ? Abnormal uterine bleeding (AUB) 09/19/2020  ?  Viral upper respiratory tract infection 08/11/2020  ? Nausea 08/11/2020  ? Dyspnea 05/14/2020  ? ?History reviewed. No pertinent past medical history.  ?History reviewed. No pertinent family history.  ?Past Surgical History:  ?Procedure Laterality Date  ? CHOLECYSTECTOMY  2021  ? ?Social History  ? ?Occupational History  ? Not on file  ?Tobacco Use  ? Smoking status: Never  ? Smokeless tobacco: Never  ?Substance and Sexual Activity  ? Alcohol use: Never  ? Drug use: Never  ? Sexual activity: Yes  ?  Birth control/protection: Pill  ? ? ? ? ? ?

## 2021-12-04 NOTE — Telephone Encounter (Signed)
Patient called into the office stating that when she was seen today she was told a medication would be called in. She stated that she has yet to hear anything yet please advise.  ?

## 2021-12-04 NOTE — Addendum Note (Signed)
Addended by: Aldean Baker on: 12/04/2021 05:11 PM ? ? Modules accepted: Orders ? ?

## 2021-12-04 NOTE — Telephone Encounter (Signed)
Called and sw pt ot advise that Dr. Lajoyce Corners will call in rx for relafen to her pharmacy.  ?

## 2021-12-05 ENCOUNTER — Encounter (HOSPITAL_COMMUNITY): Payer: Self-pay

## 2021-12-05 ENCOUNTER — Ambulatory Visit (HOSPITAL_COMMUNITY)
Admission: EM | Admit: 2021-12-05 | Discharge: 2021-12-05 | Disposition: A | Discharge status: Home / Self Care | Payer: Self-pay | Attending: Physician Assistant | Admitting: Physician Assistant

## 2021-12-05 ENCOUNTER — Other Ambulatory Visit: Payer: Self-pay

## 2021-12-05 DIAGNOSIS — H6691 Otitis media, unspecified, right ear: Secondary | ICD-10-CM

## 2021-12-05 DIAGNOSIS — H6690 Otitis media, unspecified, unspecified ear: Secondary | ICD-10-CM

## 2021-12-05 MED ORDER — AMOXICILLIN 500 MG PO CAPS: 500.0000 mg | ORAL_CAPSULE | Freq: Three times a day (TID) | ORAL | 0 refills | Status: DC

## 2021-12-05 NOTE — ED Provider Notes (Signed)
?[facility] ? ? ? ?CSN: [id number] ?Arrival date & time: 12/05/21  1555 ? ? ?  ? ?History   ?Chief Complaint ?Chief Complaint  ?Patient presents with  ? Otalgia  ? ? ?HPI ?Monica Peters is a 36 y.o. female.  ? ?Pt complains of ear pain and ear itching  ? ?The history is provided by the patient. No language interpreter was used.  ?Otalgia ?Location:  Left ?Behind ear:  No abnormality ?Quality:  Aching ?Severity:  Moderate ?Onset quality:  Gradual ?Duration:  2 weeks ?Timing:  Constant ?Progression:  Worsening ?Chronicity:  New ?Relieved by:  Nothing ?Worsened by:  Nothing ?Ineffective treatments:  None tried ?Associated symptoms: no cough   ? ?History reviewed. No pertinent past medical history. ? ?Patient Active Problem List  ? Diagnosis Date Noted  ? Abnormal uterine bleeding (AUB) 09/19/2020  ? Viral upper respiratory tract infection 08/11/2020  ? Nausea 08/11/2020  ? Dyspnea 05/14/2020  ? ? ?Past Surgical History:  ?Procedure Laterality Date  ? CHOLECYSTECTOMY  2021  ? ? ?OB History   ? ? Gravida  ?5  ? Para  ?3  ? Term  ?2  ? Preterm  ?1  ? AB  ?2  ? Living  ?3  ?  ? ? SAB  ?2  ? IAB  ?0  ? Ectopic  ?0  ? Multiple  ?0  ? Live Births  ?3  ?   ?  ?  ? ? ? ?Home Medications   ? ?Prior to Admission medications   ?Medication Sig Start Date End Date Taking? Authorizing Provider  ?amoxicillin (AMOXIL) 500 MG capsule Take 1 capsule (500 mg total) by mouth 3 (three) times daily. 12/05/21  Yes Cheron Schaumann K, PA-C  ?albuterol (VENTOLIN HFA) 108 (90 Base) MCG/ACT inhaler Inhale 2 puffs into the lungs every 6 (six) hours as needed for wheezing or shortness of breath. 04/12/20   Grayce Sessions, NP  ?busPIRone (BUSPAR) 7.5 MG tablet Take 1 tablet (7.5 mg total) by mouth 3 (three) times daily. 08/19/21   Grayce Sessions, NP  ?ibuprofen (ADVIL) 600 MG tablet Take 1 tablet (600 mg total) by mouth every 8 (eight) hours as needed for moderate pain. 08/19/21   Grayce Sessions, NP  ?methocarbamol  (ROBAXIN) 500 MG tablet TAKE 1 TABLET (500 MG TOTAL) BY MOUTH EVERY 8 (EIGHT) HOURS AS NEEDED FOR MUSCLE SPASMS. 08/19/21 08/19/22  Grayce Sessions, NP  ?nabumetone (RELAFEN) 750 MG tablet Take 1 tablet (750 mg total) by mouth 2 (two) times daily as needed for mild pain or moderate pain. with food 12/04/21 01/04/22  Nadara Mustard, MD  ?Norgestimate-Ethinyl Estradiol Triphasic 0.18/0.215/0.25 MG-35 MCG tablet TAKE 1 TABLET BY MOUTH DAILY. 11/07/20 11/07/21  Adam Phenix, MD  ?levonorgestrel-ethinyl estradiol (NORDETTE) 0.15-30 MG-MCG tablet Take 1 tablet by mouth daily. ?Patient not taking: Reported on 11/07/2020 06/05/20 11/07/20  Grayce Sessions, NP  ? ? ?Family History ?History reviewed. No pertinent family history. ? ?Social History ?Social History  ? ?Tobacco Use  ? Smoking status: Never  ? Smokeless tobacco: Never  ?Substance Use Topics  ? Alcohol use: Never  ? Drug use: Never  ? ? ? ?Allergies   ?Patient has no known allergies. ? ? ?Review of Systems ?Review of Systems  ?HENT:  Positive for ear pain.   ?Respiratory:  Negative for cough.   ?All other systems reviewed and are negative. ? ? ?Physical Exam ?Triage Vital Signs ?ED Triage Vitals [12/05/21  1651]  ?Enc Vitals Group  ?   BP 128/80  ?   Pulse Rate 81  ?   Resp 16  ?   Temp 98.1 ?F (36.7 ?C)  ?   Temp Source Oral  ?   SpO2 100 %  ?   Weight   ?   Height   ?   Head Circumference   ?   Peak Flow   ?   Pain Score   ?   Pain Loc   ?   Pain Edu?   ?   Excl. in GC?   ? ?No data found. ? ?Updated Vital Signs ?BP 128/80 (BP Location: Left Arm)   Pulse 81   Temp 98.1 ?F (36.7 ?C) (Oral)   Resp 16   SpO2 100%  ? ?Visual Acuity ?Right Eye Distance:   ?Left Eye Distance:   ?Bilateral Distance:   ? ?Right Eye Near:   ?Left Eye Near:    ?Bilateral Near:    ? ?Physical Exam ?Vitals and nursing note reviewed.  ?Constitutional:   ?   Appearance: She is well-developed.  ?HENT:  ?   Head: Normocephalic.  ?   Left Ear: Tympanic membrane normal.  ?   Ears:  ?    Comments: Right tm erythematous,   ?Pulmonary:  ?   Effort: Pulmonary effort is normal.  ?Abdominal:  ?   General: There is no distension.  ?Musculoskeletal:     ?   General: Normal range of motion.  ?   Cervical back: Normal range of motion.  ?Neurological:  ?   Mental Status: She is alert and oriented to person, place, and time.  ? ? ? ?UC Treatments / Results  ?Labs ?(all labs ordered are listed, but only abnormal results are displayed) ?Labs Reviewed - No data to display ? ?EKG ? ? ?Radiology ?No results found. ? ?Procedures ?Procedures (including critical care time) ? ?Medications Ordered in UC ?Medications - No data to display ? ?Initial Impression / Assessment and Plan / UC Course  ?I have reviewed the triage vital signs and the nursing notes. ? ?Pertinent labs & imaging results that were available during my care of the patient were reviewed by me and considered in my medical decision making (see chart for details). ? ?  ? ?MDM:  Pt has had some foul drainage from ear. TM is erythematous  ?Final Clinical Impressions(s) / UC Diagnoses  ? ?Final diagnoses:  ?Acute otitis media, unspecified otitis media type  ? ? ? ?Discharge Instructions   ? ?  ?Return if any problems.  ? ? ?ED Prescriptions   ? ? Medication Sig Dispense Auth. Provider  ? amoxicillin (AMOXIL) 500 MG capsule Take 1 capsule (500 mg total) by mouth 3 (three) times daily. 30 capsule Elson Areas, [place]  ? ?  ? ?PDMP not reviewed this encounter. ?  ?Elson Areas, PA-C ?12/05/21 1733 ? ?

## 2021-12-05 NOTE — ED Triage Notes (Signed)
Pt presents to the office for left ear pain x 2 weeks. ?

## 2021-12-05 NOTE — Discharge Instructions (Addendum)
Return if any problems.

## 2021-12-12 ENCOUNTER — Other Ambulatory Visit: Payer: Self-pay

## 2021-12-17 ENCOUNTER — Ambulatory Visit (INDEPENDENT_AMBULATORY_CARE_PROVIDER_SITE_OTHER): Payer: Self-pay | Admitting: *Deleted

## 2021-12-17 NOTE — Telephone Encounter (Signed)
FYI

## 2021-12-17 NOTE — Telephone Encounter (Signed)
?  Chief Complaint: Left ear continues to have discharge and foul odor after being on antibiotic. ?Symptoms: White/clear foul smelling discharge for total of 4 weeks.   Seen at urgent care on 12/05/2021 given antibiotic. ?Frequency: Discharge for 4 weeks total ?Pertinent Negatives: Patient denies any other URI symptoms ?Disposition: [] ED /[x] Urgent Care (no appt availability in office) / [] Appointment(In office/virtual)/ []  [place] [facility]/ [] Home Care/ [] Refused Recommended Disposition /[] [place] [facility]/ []  Follow-up with PCP ?Additional Notes: Offered [place] virtual care option however she decided she would rather go back to the same urgent care she went to 2 weeks ago.   No appts at [facility] until May.   She was very limited in the times she was available for an appt.   I checked to see if , PA was available on Friday as pt said she would be available on Friday afternoon however [facility], PA is not working Friday. ? ? ?

## 2021-12-17 NOTE — Telephone Encounter (Signed)
I returned pt's call using [employer] [place] [phone]. ? ?She is c/o still having drainage from her ear.  White/clear drainage. Seen at urgent care and put on antibiotics but still having drainage and it's been 4 weeks since the drainage started. ?Today is last day of antibiotics. ? ? ?Reason for Disposition ? Foul-smelling discharge ? ?Answer Assessment - Initial Assessment Questions ?1. LOCATION: "Which ear is involved?"  ?    Left ear ?2. COLOR: "What is the color of the discharge?"  ?    Sometimes it is clear/white.   Has a foul odor too. No improvement with antibiotic.   ?3. CONSISTENCY: "How runny is the discharge? Could it be water?"  ?    Not asked ?4. ONSET: "When did you first notice the discharge?" ?    4 weeks ago    I went to the urgent care 2 weeks ago. ?5. PAIN: "Is there any earache?" "How bad is it?"  (Scale 1-10; or mild, moderate, severe) ?    Not much pain but burning and itching rea bad ?6. OBJECTS: "Have you put anything in your ear?" (e.g., Q-tip, other object)  ?    Not asked ?7. OTHER SYMPTOMS: "Do you have any other symptoms?" (e.g., headache, fever, dizziness, vomiting, runny nose) ?    No other symptoms runny nose, sore throat or coughing - no.   ?8. PREGNANCY: "Is there any chance you are pregnant?" "When was your last menstrual period?" ?    Not asked ? ?Protocols used: Ear - Discharge-A-AH ? ?

## 2022-01-01 ENCOUNTER — Other Ambulatory Visit: Payer: Self-pay

## 2022-01-01 ENCOUNTER — Ambulatory Visit (INDEPENDENT_AMBULATORY_CARE_PROVIDER_SITE_OTHER): Payer: Self-pay | Admitting: Orthopedic Surgery

## 2022-01-01 DIAGNOSIS — M76822 Posterior tibial tendinitis, left leg: Secondary | ICD-10-CM

## 2022-01-01 MED ORDER — NABUMETONE 750 MG PO TABS
750.0000 mg | ORAL_TABLET | Freq: Two times a day (BID) | ORAL | 3 refills | Status: DC | PRN
Start: 2022-01-01 — End: 2022-05-15
  Filled 2022-01-01: qty 60, 30d supply, fill #0
  Filled 2022-02-20: qty 60, 30d supply, fill #1

## 2022-01-02 ENCOUNTER — Ambulatory Visit (HOSPITAL_COMMUNITY)
Admission: EM | Admit: 2022-01-02 | Discharge: 2022-01-02 | Disposition: A | Discharge status: Home / Self Care | Payer: Self-pay | Attending: Physician Assistant | Admitting: Physician Assistant

## 2022-01-02 ENCOUNTER — Encounter (HOSPITAL_COMMUNITY): Payer: Self-pay | Admitting: Emergency Medicine

## 2022-01-02 ENCOUNTER — Other Ambulatory Visit: Payer: Self-pay

## 2022-01-02 DIAGNOSIS — H60502 Unspecified acute noninfective otitis externa, left ear: Secondary | ICD-10-CM

## 2022-01-02 MED ORDER — OFLOXACIN 0.3 % OT SOLN
5.0000 [drp] | Freq: Two times a day (BID) | OTIC | 0 refills | Status: DC
  Filled 2022-01-02: qty 5, 10d supply, fill #0

## 2022-01-02 NOTE — ED Provider Notes (Signed)
?[facility] ? ? ? ?CSN: [id number] ?Arrival date & time: 01/02/22  [id number] ? ? ?  ? ?History   ?Chief Complaint ?Chief Complaint  ?Patient presents with  ? Otalgia  ? Ear Drainage  ? ? ?HPI ?Monica Peters is a 36 y.o. female.  ? ?Patient presents today with a month-long history of left otalgia and otorrhea.  She was seen by our clinic on 12/05/2021 with similar symptoms at which point she was prescribed amoxicillin.  She reports symptoms did not improve with this medication.  Drainage has become more frequent and has developed a foul odor prompting evaluation.  She denies any earbud, earplugs, Q-tip use.  Denies any recent swimming or airplane travel.  She does report associated headache but denies any fever, cough, congestion, nausea, vomiting.  She denies history of diabetes or immunosuppression.  Denies additional antibiotic use in the past several months. ? ? ?History reviewed. No pertinent past medical history. ? ?Patient Active Problem List  ? Diagnosis Date Noted  ? Abnormal uterine bleeding (AUB) 09/19/2020  ? Viral upper respiratory tract infection 08/11/2020  ? Nausea 08/11/2020  ? Dyspnea 05/14/2020  ? ? ?Past Surgical History:  ?Procedure Laterality Date  ? CHOLECYSTECTOMY  2021  ? ? ?OB History   ? ? Gravida  ?5  ? Para  ?3  ? Term  ?2  ? Preterm  ?1  ? AB  ?2  ? Living  ?3  ?  ? ? SAB  ?2  ? IAB  ?0  ? Ectopic  ?0  ? Multiple  ?0  ? Live Births  ?3  ?   ?  ?  ? ? ? ?Home Medications   ? ?Prior to Admission medications   ?Medication Sig Start Date End Date Taking? Authorizing Provider  ?ofloxacin (FLOXIN) 0.3 % OTIC solution Place 5 drops into the left ear 2 (two) times daily. 01/02/22  Yes Oriya Kettering K, PA-C  ?albuterol (VENTOLIN HFA) 108 (90 Base) MCG/ACT inhaler Inhale 2 puffs into the lungs every 6 (six) hours as needed for wheezing or shortness of breath. 04/12/20   Grayce SessionsEdwards, Michelle P, NP  ?busPIRone (BUSPAR) 7.5 MG tablet Take 1 tablet (7.5 mg total) by mouth 3 (three) times daily.  08/19/21   Grayce SessionsEdwards, Michelle P, NP  ?ibuprofen (ADVIL) 600 MG tablet Take 1 tablet (600 mg total) by mouth every 8 (eight) hours as needed for moderate pain. 08/19/21   Grayce SessionsEdwards, Michelle P, NP  ?methocarbamol (ROBAXIN) 500 MG tablet TAKE 1 TABLET (500 MG TOTAL) BY MOUTH EVERY 8 (EIGHT) HOURS AS NEEDED FOR MUSCLE SPASMS. 08/19/21 08/19/22  Grayce SessionsEdwards, Michelle P, NP  ?nabumetone (RELAFEN) 750 MG tablet Take 1 tablet (750 mg total) by mouth 2 (two) times daily as needed for mild pain or moderate pain. with food 01/01/22   Nadara Mustarduda, Marcus V, MD  ?Norgestimate-Ethinyl Estradiol Triphasic 0.18/0.215/0.25 MG-35 MCG tablet TAKE 1 TABLET BY MOUTH DAILY. 11/07/20 11/07/21  Adam PhenixArnold, James G, MD  ?levonorgestrel-ethinyl estradiol (NORDETTE) 0.15-30 MG-MCG tablet Take 1 tablet by mouth daily. ?Patient not taking: Reported on 11/07/2020 06/05/20 11/07/20  Grayce SessionsEdwards, Michelle P, NP  ? ? ?Family History ?History reviewed. No pertinent family history. ? ?Social History ?Social History  ? ?Tobacco Use  ? Smoking status: Never  ? Smokeless tobacco: Never  ?Substance Use Topics  ? Alcohol use: Never  ? Drug use: Never  ? ? ? ?Allergies   ?Patient has no known allergies. ? ? ?Review of Systems ?Review of Systems  ?  Constitutional:  Positive for activity change. Negative for appetite change, fatigue and fever.  ?HENT:  Positive for ear discharge and ear pain. Negative for congestion, sinus pressure, sneezing and sore throat.   ?Respiratory:  Negative for cough and shortness of breath.   ?Cardiovascular:  Negative for chest pain.  ?Gastrointestinal:  Negative for abdominal pain, diarrhea, nausea and vomiting.  ?Neurological:  Positive for headaches. Negative for dizziness and light-headedness.  ? ? ?Physical Exam ?Triage Vital Signs ?ED Triage Vitals  ?Enc Vitals Group  ?   BP 01/02/22 0947 132/89  ?   Pulse Rate 01/02/22 0947 77  ?   Resp 01/02/22 0947 17  ?   Temp 01/02/22 0947 98 ?F (36.7 ?C)  ?   Temp Source 01/02/22 0947 Oral  ?   SpO2 01/02/22 0947  99 %  ?   Weight 01/02/22 0946 240 lb 15.4 oz (109.3 kg)  ?   Height 01/02/22 0946 5' 6.5" (1.689 m)  ?   Head Circumference --   ?   Peak Flow --   ?   Pain Score 01/02/22 0945 8  ?   Pain Loc --   ?   Pain Edu? --   ?   Excl. in GC? --   ? ?No data found. ? ?Updated Vital Signs ?BP 132/89 (BP Location: Left Arm)   Pulse 77   Temp 98 ?F (36.7 ?C) (Oral)   Resp 17   Ht 5' 6.5" (1.689 m)   Wt 240 lb 15.4 oz (109.3 kg)   SpO2 99%   BMI 38.31 kg/m?  ? ?Visual Acuity ?Right Eye Distance:   ?Left Eye Distance:   ?Bilateral Distance:   ? ?Right Eye Near:   ?Left Eye Near:    ?Bilateral Near:    ? ?Physical Exam ?Vitals reviewed.  ?Constitutional:   ?   General: She is awake. She is not in acute distress. ?   Appearance: Normal appearance. She is well-developed. She is not ill-appearing.  ?   Comments: Very pleasant female appears stated age in no acute distress sitting comfortably in exam room  ?HENT:  ?   Head: Normocephalic and atraumatic.  ?   Right Ear: Tympanic membrane, ear canal and external ear normal. Tympanic membrane is not erythematous or bulging.  ?   Left Ear: Tympanic membrane and external ear normal. Drainage, swelling and tenderness present. Tympanic membrane is not erythematous or bulging.  ?   Ears:  ?   Comments: Pain with manipulation of external ear and tenderness to palpation at tragus.  Erythema and drainage noted external auditory canal.  Normal-appearing TM. ?   Mouth/Throat:  ?   Pharynx: Uvula midline. Posterior oropharyngeal erythema present. No oropharyngeal exudate.  ?Cardiovascular:  ?   Rate and Rhythm: Normal rate and regular rhythm.  ?   Heart sounds: Normal heart sounds, S1 normal and S2 normal. No murmur heard. ?Pulmonary:  ?   Effort: Pulmonary effort is normal.  ?   Breath sounds: Normal breath sounds. No wheezing, rhonchi or rales.  ?   Comments: Clear to auscultation bilaterally ?Psychiatric:     ?   Behavior: Behavior is cooperative.  ? ? ? ?UC Treatments / Results   ?Labs ?(all labs ordered are listed, but only abnormal results are displayed) ?Labs Reviewed - No data to display ? ?EKG ? ? ?Radiology ?No results found. ? ?Procedures ?Procedures (including critical care time) ? ?Medications Ordered in UC ?Medications - No data to display ? ?  Initial Impression / Assessment and Plan / UC Course  ?I have reviewed the triage vital signs and the nursing notes. ? ?Pertinent labs & imaging results that were available during my care of the patient were reviewed by me and considered in my medical decision making (see chart for details). ? ?  ? ?Otitis media identified on physical exam.  Patient was started on ofloxacin drops to manage symptoms.  Recommended she keep ear facing upward for several minutes after application of drops to allow complete penetration of the right ear canal.  She was discouraged from using earbud/earplugs, Q-tips.  She should not submerge her head in water until symptoms resolve.  Discussed that if symptoms or not improving within a week she should return here or see PCP.  She was given contact information for ENT in case symptoms persist with instruction to call to schedule an appointment.  Discussed that if she has any alarm symptoms including fever, severe headache, change in characteristics of drainage, increased otalgia, difficulty hearing she needs to be seen immediately.  She declined work excuse note today. ? ?Final Clinical Impressions(s) / UC Diagnoses  ? ?Final diagnoses:  ?Acute otitis externa of left ear, unspecified type  ? ? ? ?Discharge Instructions   ? ?  ?Apply drops twice daily.  Keep ear facing upward for several minutes to allow medication to get all the way anterior canal.  Do not put your head underwater until symptoms resolve.  Avoid putting anything in your ear including earbuds, earplugs, Q-tips.  If your symptoms are not improving please follow-up with ENT; call to schedule an appointment.  If anything worsens and you have severe  headache, change in the characteristics of drainage, severe ear pain, fever, nausea, vomiting you should be seen immediately. ? ? ? ? ?ED Prescriptions   ? ? Medication Sig Dispense Auth. Provider  ? ofloxacin (FLOXIN) 0.

## 2022-01-02 NOTE — Discharge Instructions (Signed)
Apply drops twice daily.  Keep ear facing upward for several minutes to allow medication to get all the way anterior canal.  Do not put your head underwater until symptoms resolve.  Avoid putting anything in your ear including earbuds, earplugs, Q-tips.  If your symptoms are not improving please follow-up with ENT; call to schedule an appointment.  If anything worsens and you have severe headache, change in the characteristics of drainage, severe ear pain, fever, nausea, vomiting you should be seen immediately. ?

## 2022-01-02 NOTE — ED Triage Notes (Signed)
Pt reports left ear pain and drainage. States was seen here on 4/7 for same and pain and drainage never improved.  ?

## 2022-01-22 ENCOUNTER — Other Ambulatory Visit: Payer: Self-pay

## 2022-01-22 ENCOUNTER — Encounter: Payer: Self-pay | Admitting: Physician Assistant

## 2022-01-22 ENCOUNTER — Ambulatory Visit: Payer: Self-pay | Admitting: Physician Assistant

## 2022-01-22 VITALS — BP 128/74 | HR 84 | Temp 98.0°F | Ht 65.0 in | Wt 241.0 lb

## 2022-01-22 DIAGNOSIS — H66002 Acute suppurative otitis media without spontaneous rupture of ear drum, left ear: Secondary | ICD-10-CM

## 2022-01-22 DIAGNOSIS — J302 Other seasonal allergic rhinitis: Secondary | ICD-10-CM

## 2022-01-22 DIAGNOSIS — Z3202 Encounter for pregnancy test, result negative: Secondary | ICD-10-CM

## 2022-01-22 DIAGNOSIS — N926 Irregular menstruation, unspecified: Secondary | ICD-10-CM

## 2022-01-22 DIAGNOSIS — K219 Gastro-esophageal reflux disease without esophagitis: Secondary | ICD-10-CM

## 2022-01-22 LAB — POCT URINE PREGNANCY: Preg Test, Ur: NEGATIVE

## 2022-01-22 MED ORDER — CETIRIZINE HCL 10 MG PO TABS
10.0000 mg | ORAL_TABLET | Freq: Every day | ORAL | 11 refills | Status: DC
  Filled 2022-01-22: qty 30, 30d supply, fill #0
  Filled 2022-04-01: qty 30, 30d supply, fill #1

## 2022-01-22 MED ORDER — OMEPRAZOLE 20 MG PO CPDR
20.0000 mg | DELAYED_RELEASE_CAPSULE | Freq: Every day | ORAL | 3 refills | Status: DC
  Filled 2022-01-22: qty 30, 30d supply, fill #0

## 2022-01-22 MED ORDER — OFLOXACIN 0.3 % OT SOLN
5.0000 [drp] | Freq: Two times a day (BID) | OTIC | 0 refills | Status: DC
  Filled 2022-01-22: qty 5, 10d supply, fill #0

## 2022-01-22 NOTE — Progress Notes (Signed)
Patient reports L ear pain beginning 3 days ago after using ear drops for similar concern 1 month ago.  Patient has had coffee with cream and sugar.

## 2022-01-22 NOTE — Patient Instructions (Signed)
You are going to start taking Zyrtec on a daily basis.  To help with your left ear infection, you are going to do another round of the eardrops as well.  To help with your stomach, you are going to start taking omeprazole on a daily basis.  You should take this every day while you are taking the medication to help with your foot regardless if you are having heartburn or nausea.  Please let us know there is any else we can do for you.  Roney Jaffe, PA-C Physician Assistant [facility] [email or web address]   Gastroesophageal Reflux Disease, Adult Gastroesophageal reflux (GER) happens when acid from the stomach flows up into the tube that connects the mouth and the stomach (esophagus). Normally, food travels down the esophagus and stays in the stomach to be digested. However, when a person has GER, food and stomach acid sometimes move back up into the esophagus. If this becomes a more serious problem, the person may be diagnosed with a disease called gastroesophageal reflux disease (GERD). GERD occurs when the reflux: Happens often. Causes frequent or severe symptoms. Causes problems such as damage to the esophagus. When stomach acid comes in contact with the esophagus, the acid may cause inflammation in the esophagus. Over time, GERD may create small holes (ulcers) in the lining of the esophagus. What are the causes? This condition is caused by a problem with the muscle between the esophagus and the stomach (lower esophageal sphincter, or LES). Normally, the LES muscle closes after food passes through the esophagus to the stomach. When the LES is weakened or abnormal, it does not close properly, and that allows food and stomach acid to go back up into the esophagus. The LES can be weakened by certain dietary substances, medicines, and medical conditions, including: Tobacco use. Pregnancy. Having a hiatal hernia. Alcohol use. Certain  foods and beverages, such as coffee, chocolate, onions, and peppermint. What increases the risk? You are more likely to develop this condition if you: Have an increased body weight. Have a connective tissue disorder. Take NSAIDs, such as ibuprofen. What are the signs or symptoms? Symptoms of this condition include: Heartburn. Difficult or painful swallowing and the feeling of having a lump in the throat. A bitter taste in the mouth. Bad breath and having a large amount of saliva. Having an upset or bloated stomach and belching. Chest pain. Different conditions can cause chest pain. Make sure you see your health care provider if you experience chest pain. Shortness of breath or wheezing. Ongoing (chronic) cough or a nighttime cough. Wearing away of tooth enamel. Weight loss. How is this diagnosed? This condition may be diagnosed based on a medical history and a physical exam. To determine if you have mild or severe GERD, your health care provider may also monitor how you respond to treatment. You may also have tests, including: A test to examine your stomach and esophagus with a small camera (endoscopy). A test that measures the acidity level in your esophagus. A test that measures how much pressure is on your esophagus. A barium swallow or modified barium swallow test to show the shape, size, and functioning of your esophagus. How is this treated? Treatment for this condition may vary depending on how severe your symptoms are. Your health care provider may recommend: Changes to your diet. Medicine. Surgery. The goal of treatment is to help relieve your symptoms and to prevent complications. Follow these instructions at home: Eating and drinking  Follow a diet  as recommended by your health care provider. This may involve avoiding foods and drinks such as: Coffee and tea, with or without caffeine. Drinks that contain alcohol. Energy drinks and sports drinks. Carbonated drinks or  sodas. Chocolate and cocoa. Peppermint and mint flavorings. Garlic and onions. Horseradish. Spicy and acidic foods, including peppers, chili powder, curry powder, vinegar, hot sauces, and barbecue sauce. Citrus fruit juices and citrus fruits, such as oranges, lemons, and limes. Tomato-based foods, such as red sauce, chili, salsa, and pizza with red sauce. Fried and fatty foods, such as donuts, french fries, potato chips, and high-fat dressings. High-fat meats, such as hot dogs and fatty cuts of red and white meats, such as rib eye steak, sausage, ham, and bacon. High-fat dairy items, such as whole milk, butter, and cream cheese. Eat small, frequent meals instead of large meals. Avoid drinking large amounts of liquid with your meals. Avoid eating meals during the 2-3 hours before bedtime. Avoid lying down right after you eat. Do not exercise right after you eat. Lifestyle  Do not use any products that contain nicotine or tobacco. These products include cigarettes, chewing tobacco, and vaping devices, such as e-cigarettes. If you need help quitting, ask your health care provider. Try to reduce your stress by using methods such as yoga or meditation. If you need help reducing stress, ask your health care provider. If you are overweight, reduce your weight to an amount that is healthy for you. Ask your health care provider for guidance about a safe weight loss goal. General instructions Pay attention to any changes in your symptoms. Take over-the-counter and prescription medicines only as told by your health care provider. Do not take aspirin, ibuprofen, or other NSAIDs unless your health care provider told you to take these medicines. Wear loose-fitting clothing. Do not wear anything tight around your waist that causes pressure on your abdomen. Raise (elevate) the head of your bed about 6 inches (15 cm). You can use a wedge to do this. Avoid bending over if this makes your symptoms  worse. Keep all follow-up visits. This is important. Contact a health care provider if: You have: New symptoms. Unexplained weight loss. Difficulty swallowing or it hurts to swallow. Wheezing or a persistent cough. A hoarse voice. Your symptoms do not improve with treatment. Get help right away if: You have sudden pain in your arms, neck, jaw, teeth, or back. You suddenly feel sweaty, dizzy, or light-headed. You have chest pain or shortness of breath. You vomit and the vomit is green, yellow, or black, or it looks like blood or coffee grounds. You faint. You have stool that is red, bloody, or black. You cannot swallow, drink, or eat. These symptoms may represent a serious problem that is an emergency. Do not wait to see if the symptoms will go away. Get medical help right away. Call your local emergency services (911 in the [place] Do not drive yourself to the hospital. Summary Gastroesophageal reflux happens when acid from the stomach flows up into the esophagus. GERD is a disease in which the reflux happens often, causes frequent or severe symptoms, or causes problems such as damage to the esophagus. Treatment for this condition may vary depending on how severe your symptoms are. Your health care provider may recommend diet and lifestyle changes, medicine, or surgery. Contact a health care provider if you have new or worsening symptoms. Take over-the-counter and prescription medicines only as told by your health care provider. Do not take aspirin, ibuprofen, or other  NSAIDs unless your health care provider told you to do so. Keep all follow-up visits as told by your health care provider. This is important. This information is not intended to replace advice given to you by your health care provider. Make sure you discuss any questions you have with your health care provider. Document Revised: 02/26/2020 Document Reviewed: 02/26/2020 Elsevier Patient Education  2023 [employer].

## 2022-01-22 NOTE — Progress Notes (Signed)
Established Patient Office Visit  Subjective   Patient ID: Monica Peters, female    DOB: March 18, 1986  Age: 36 y.o. MRN: [id number]  Chief Complaint  Patient presents with   Otitis Media    States that she was treated for otitis media on her left ear 2 months ago with amoxicillin.  States that she did have mild improvement, but did return to urgent care 1 month later and was treated for acute otitis externa on her left ear with eardrops.  States that she did finish the prescription and her symptoms resolved.  States that for the last 2 weeks she has been feeling fine, states that she started having itching in both of her ears but worse in the left ear yesterday and noticed the similar drainage after her shower last night.  States that she continues to be treated by orthopedics for left foot tendinitis, states that she is currently taking an anti-inflammatory.  States that she has been taking it with food.  States that in the last few days she has had some nausea as well as heartburn.  Has not tried anything for relief.  Does request pregnancy test, states that she is 5 days late.   History reviewed. No pertinent past medical history. Social History   Socioeconomic History   Marital status: Single    Spouse name: Not on file   Number of children: Not on file   Years of education: Not on file   Highest education level: Not on file  Occupational History   Not on file  Tobacco Use   Smoking status: Never   Smokeless tobacco: Never  Substance and Sexual Activity   Alcohol use: Never   Drug use: Never   Sexual activity: Yes    Birth control/protection: Pill  Other Topics Concern   Not on file  Social History Narrative   Not on file   Social Determinants of Health   Financial Resource Strain: Not on file  Food Insecurity: Not on file  Transportation Needs: Not on file  Physical Activity: Not on file  Stress: Not on file  Social Connections: Not on file  Intimate  Partner Violence: Not on file   History reviewed. No pertinent family history. No Known Allergies    Review of Systems  Constitutional:  Negative for chills and fever.  HENT:  Positive for ear discharge. Negative for ear pain, hearing loss, sinus pain and sore throat.   Eyes: Negative.   Respiratory:  Negative for cough and shortness of breath.   Cardiovascular:  Negative for chest pain.  Gastrointestinal:  Positive for heartburn and nausea.  Genitourinary: Negative.   Musculoskeletal: Negative.   Skin: Negative.   Neurological: Negative.   Endo/Heme/Allergies: Negative.   Psychiatric/Behavioral: Negative.       Objective:     BP 128/74 (BP Location: Left Arm, Patient Position: Sitting, Cuff Size: Normal)   Pulse 84   Temp 98 F (36.7 C) (Oral)   Ht 5\' 5"  (1.651 m)   Wt 241 lb (109.3 kg)   BMI 40.10 kg/m    Physical Exam Vitals and nursing note reviewed.  Constitutional:      Appearance: Normal appearance.  HENT:     Head: Normocephalic and atraumatic.     Right Ear: Hearing, tympanic membrane, ear canal and external ear normal.     Left Ear: Hearing and external ear normal. Drainage present. No swelling or tenderness. A middle ear effusion is present.     Nose:  Nose normal.     Mouth/Throat:     Mouth: Mucous membranes are moist.     Pharynx: Oropharynx is clear.  Eyes:     Extraocular Movements: Extraocular movements intact.     Conjunctiva/sclera: Conjunctivae normal.     Pupils: Pupils are equal, round, and reactive to light.  Cardiovascular:     Rate and Rhythm: Normal rate.     Pulses: Normal pulses.     Heart sounds: Normal heart sounds.  Pulmonary:     Effort: Pulmonary effort is normal.     Breath sounds: Normal breath sounds.  Abdominal:     Tenderness: There is no abdominal tenderness.  Musculoskeletal:        General: Normal range of motion.     Cervical back: Normal range of motion and neck supple.     Comments: Walking boot left foot    Skin:    General: Skin is warm and dry.  Neurological:     General: No focal deficit present.     Mental Status: She is alert.  Psychiatric:        Mood and Affect: Mood normal.        Behavior: Behavior normal.        Thought Content: Thought content normal.        Judgment: Judgment normal.      Assessment & Plan:   Problem List Items Addressed This Visit   None Visit Diagnoses     Acute suppr otitis media w/o spon rupt ear drum, left ear    -  Primary   Relevant Medications   ofloxacin (FLOXIN) 0.3 % OTIC solution   Seasonal allergies       Relevant Medications   cetirizine (ZYRTEC) 10 MG tablet   Gastroesophageal reflux disease without esophagitis       Relevant Medications   omeprazole (PRILOSEC) 20 MG capsule   Missed period       Relevant Orders   POCT urine pregnancy (Completed)   Encounter for pregnancy test, result negative          1. Acute suppr otitis media w/o spon rupt ear drum, left ear Trial Floxin.  Patient education given on supportive care.  Red flags given for prompt reevaluation - ofloxacin (FLOXIN) 0.3 % OTIC solution; Place 5 drops into the left ear 2 (two) times daily.  Dispense: 5 mL; Refill: 0  2. Seasonal allergies Trial Zyrtec - cetirizine (ZYRTEC) 10 MG tablet; Take 1 tablet (10 mg total) by mouth daily.  Dispense: 30 tablet; Refill: 11  3. Gastroesophageal reflux disease without esophagitis Trial of Prilosec.  Patient education given on supportive care - omeprazole (PRILOSEC) 20 MG capsule; Take 1 capsule (20 mg total) by mouth daily.  Dispense: 30 capsule; Refill: 3  4. Missed period Negative. - POCT urine pregnancy  5. Encounter for pregnancy test, result negative    I have reviewed the patient's medical history (PMH, PSH, Social History, Family History, Medications, and allergies) , and have been updated if relevant. I spent 30 minutes reviewing chart and  face to face time with patient.    Return if symptoms worsen or  fail to improve.    Kasandra Knudsen Mayers, PA-C

## 2022-01-23 ENCOUNTER — Other Ambulatory Visit: Payer: Self-pay

## 2022-01-29 ENCOUNTER — Ambulatory Visit (INDEPENDENT_AMBULATORY_CARE_PROVIDER_SITE_OTHER): Payer: Self-pay | Admitting: Orthopedic Surgery

## 2022-01-29 DIAGNOSIS — M76822 Posterior tibial tendinitis, left leg: Secondary | ICD-10-CM

## 2022-01-30 ENCOUNTER — Encounter: Payer: Self-pay | Admitting: Orthopedic Surgery

## 2022-01-30 NOTE — Progress Notes (Signed)
Office Visit Note   Patient: Monica Peters           Date of Birth: Dec 10, 1985           MRN: [id number] Visit Date: 01/29/2022              Requested by: Grayce Sessions, NP [address] [place],  [place] [address] PCP: Grayce Sessions, NP  Chief Complaint  Patient presents with   Left Ankle - Follow-up      HPI: Patient is a 36 year old woman who presents in follow-up for left ankle posterior tibial tendon insufficiency.  She states she feels a lot better in the fracture boot.  Relafen has helped but she has had GI side effects.  Assessment & Plan: Visit Diagnoses:  1. Insufficiency of left posterior tibial tendon     Plan: Recommended continue with the fracture boot work on fascial strengthening.  Advance to an ASO at follow-up.  Follow-Up Instructions: Return in about 4 weeks (around 02/26/2022).   Ortho Exam  Patient is alert, oriented, no adenopathy, well-dressed, normal affect, normal respiratory effort. This time patient has pain to palpation over the deltoid and anterior talofibular ligament anterior drawer is stable she does have increased laxity of the subtalar with subtalar motion.  She is using Voltaren gel.  Imaging: No results found. No images are attached to the encounter.  Labs: No results found for: HGBA1C, ESRSEDRATE, CRP, LABURIC, REPTSTATUS, GRAMSTAIN, CULT, LABORGA   Lab Results  Component Value Date   ALBUMIN 4.7 05/08/2021   ALBUMIN 4.6 09/10/2020    No results found for: MG No results found for: VD25OH  No results found for: PREALBUMIN    Latest Ref Rng & Units 09/10/2020   10:21 AM 04/24/2020   10:36 AM  CBC EXTENDED  WBC 3.4 - 10.8 x10E3/uL 5.1   4.8    RBC 3.77 - 5.28 x10E6/uL 4.51   4.69    Hemoglobin 11.1 - 15.9 g/dL 65.7   84.6    HCT 96.2 - 46.6 % 40.5   42.9    Platelets 150 - 450 x10E3/uL 291   249    NEUT# 1.4 - 7.0 x10E3/uL 2.2   2.2    Lymph# 0.7 - 3.1 x10E3/uL 2.3   2.2       There is no  height or weight on file to calculate BMI.  Orders:  No orders of the defined types were placed in this encounter.  No orders of the defined types were placed in this encounter.    Procedures: No procedures performed  Clinical Data: No additional findings.  ROS:  All other systems negative, except as noted in the HPI. Review of Systems  Objective: Vital Signs: There were no vitals taken for this visit.  Specialty Comments:  No specialty comments available.  PMFS History: Patient Active Problem List   Diagnosis Date Noted   Abnormal uterine bleeding (AUB) 09/19/2020   Viral upper respiratory tract infection 08/11/2020   Nausea 08/11/2020   Dyspnea 05/14/2020   History reviewed. No pertinent past medical history.  History reviewed. No pertinent family history.  Past Surgical History:  Procedure Laterality Date   CHOLECYSTECTOMY  2021   Social History   Occupational History   Not on file  Tobacco Use   Smoking status: Never   Smokeless tobacco: Never  Substance and Sexual Activity   Alcohol use: Never   Drug use: Never   Sexual activity: Yes    Birth control/protection:  Pill

## 2022-02-08 ENCOUNTER — Encounter: Payer: Self-pay | Admitting: Orthopedic Surgery

## 2022-02-08 NOTE — Progress Notes (Signed)
Office Visit Note   Patient: Monica Peters           Date of Birth: 01-29-1986           MRN: [id number] Visit Date: 01/01/2022              Requested by: Grayce Sessions, NP [address] [place],  [place] [address] PCP: Grayce Sessions, NP  Chief Complaint  Patient presents with   Left Ankle - Follow-up      HPI: Patient is a 36 year old woman who presents in follow-up for left posterior tibial tendon insufficiency.  Patient is in a fracture boot was previously prescribed Relafen.  Patient has not taken the anti-inflammatory.  She states she is still symptomatic.  Assessment & Plan: Visit Diagnoses:  1. Insufficiency of left posterior tibial tendon     Plan: A new prescription was sent in for Relafen continue with the fracture boot.  Follow-Up Instructions: Return in about 4 weeks (around 01/29/2022).   Ortho Exam  Patient is alert, oriented, no adenopathy, well-dressed, normal affect, normal respiratory effort. Examination patient has pain to palpation over the sinus Tarsi she has a pronated valgus foot her foot is flat her toes are flat.  She has pain to palpation over the posterior tibial tendon and cannot do a single limb heel raise.  Imaging: No results found. No images are attached to the encounter.  Labs: No results found for: "HGBA1C", "ESRSEDRATE", "CRP", "LABURIC", "REPTSTATUS", "GRAMSTAIN", "CULT", "LABORGA"   Lab Results  Component Value Date   ALBUMIN 4.7 05/08/2021   ALBUMIN 4.6 09/10/2020    No results found for: "MG" No results found for: "VD25OH"  No results found for: "PREALBUMIN"    Latest Ref Rng & Units 09/10/2020   10:21 AM 04/24/2020   10:36 AM  CBC EXTENDED  WBC 3.4 - 10.8 x10E3/uL 5.1  4.8   RBC 3.77 - 5.28 x10E6/uL 4.51  4.69   Hemoglobin 11.1 - 15.9 g/dL 76.5  46.5   HCT 03.5 - 46.6 % 40.5  42.9   Platelets 150 - 450 x10E3/uL 291  249   NEUT# 1.4 - 7.0 x10E3/uL 2.2  2.2   Lymph# 0.7 - 3.1 x10E3/uL 2.3  2.2       There is no height or weight on file to calculate BMI.  Orders:  No orders of the defined types were placed in this encounter.  Meds ordered this encounter  Medications   nabumetone (RELAFEN) 750 MG tablet    Sig: Take 1 tablet (750 mg total) by mouth 2 (two) times daily as needed for mild pain or moderate pain. with food    Dispense:  60 tablet    Refill:  3     Procedures: No procedures performed  Clinical Data: No additional findings.  ROS:  All other systems negative, except as noted in the HPI. Review of Systems  Objective: Vital Signs: There were no vitals taken for this visit.  Specialty Comments:  No specialty comments available.  PMFS History: Patient Active Problem List   Diagnosis Date Noted   Abnormal uterine bleeding (AUB) 09/19/2020   Viral upper respiratory tract infection 08/11/2020   Nausea 08/11/2020   Dyspnea 05/14/2020   History reviewed. No pertinent past medical history.  History reviewed. No pertinent family history.  Past Surgical History:  Procedure Laterality Date   CHOLECYSTECTOMY  2021   Social History   Occupational History   Not on file  Tobacco Use  Smoking status: Never   Smokeless tobacco: Never  Substance and Sexual Activity   Alcohol use: Never   Drug use: Never   Sexual activity: Yes    Birth control/protection: Pill

## 2022-02-17 ENCOUNTER — Other Ambulatory Visit: Payer: Self-pay

## 2022-02-17 ENCOUNTER — Encounter (INDEPENDENT_AMBULATORY_CARE_PROVIDER_SITE_OTHER): Payer: Self-pay | Admitting: Primary Care

## 2022-02-17 ENCOUNTER — Ambulatory Visit (INDEPENDENT_AMBULATORY_CARE_PROVIDER_SITE_OTHER): Payer: Self-pay | Admitting: Primary Care

## 2022-02-17 DIAGNOSIS — E7800 Pure hypercholesterolemia, unspecified: Secondary | ICD-10-CM

## 2022-02-17 DIAGNOSIS — F320 Major depressive disorder, single episode, mild: Secondary | ICD-10-CM

## 2022-02-17 DIAGNOSIS — M76822 Posterior tibial tendinitis, left leg: Secondary | ICD-10-CM

## 2022-02-17 DIAGNOSIS — R5383 Other fatigue: Secondary | ICD-10-CM

## 2022-02-17 DIAGNOSIS — F32A Depression, unspecified: Secondary | ICD-10-CM

## 2022-02-17 DIAGNOSIS — G4700 Insomnia, unspecified: Secondary | ICD-10-CM

## 2022-02-17 MED ORDER — TRAZODONE HCL 50 MG PO TABS
25.0000 mg | ORAL_TABLET | Freq: Every evening | ORAL | 3 refills | Status: DC | PRN
  Filled 2022-02-17: qty 30, 30d supply, fill #0

## 2022-02-17 NOTE — Progress Notes (Signed)
[facility]  [name] [name], is a 36 y.o. female  [id number]  QVZ:[id number]  DOB - 09-Oct-1985  Chief Complaint  Patient presents with   Follow-up    anxiety       Subjective:   [name] is a 35 y.o. female here today for a follow up visit for fatigue. Questioned why no one ever called about her results- went over them with her but because she has My chart and seen explained she would not receive a call. Question are you depressed she burst out crying. Discusses how and what she is feeling and unable to sleep. Every little thing makes her cry. Problems actually revolve around her marriage. Patient has No headache, No chest pain, No abdominal pain - No Nausea, No new weakness tingling or numbness, No Cough - shortness of breath  No problems updated.  No Known Allergies  History reviewed. No pertinent past medical history.  Current Outpatient Medications on File Prior to Visit  Medication Sig Dispense Refill   cetirizine (ZYRTEC) 10 MG tablet Take 1 tablet (10 mg total) by mouth daily. 30 tablet 11   nabumetone (RELAFEN) 750 MG tablet Take 1 tablet (750 mg total) by mouth 2 (two) times daily as needed for mild pain or moderate pain. with food 60 tablet 3   omeprazole (PRILOSEC) 20 MG capsule Take 1 capsule (20 mg total) by mouth daily. 30 capsule 3   albuterol (VENTOLIN HFA) 108 (90 Base) MCG/ACT inhaler Inhale 2 puffs into the lungs every 6 (six) hours as needed for wheezing or shortness of breath. 8 g 0   Norgestimate-Ethinyl Estradiol Triphasic 0.18/0.215/0.25 MG-35 MCG tablet TAKE 1 TABLET BY MOUTH DAILY. 28 tablet 11   [DISCONTINUED] levonorgestrel-ethinyl estradiol (NORDETTE) 0.15-30 MG-MCG tablet Take 1 tablet by mouth daily. (Patient not taking: Reported on 11/07/2020) 28 tablet 11   No current facility-administered medications on file prior to visit.    Objective:   Vitals:   02/17/22 0917  BP: 131/89  Pulse: 85  Temp:  98 F (36.7 C)  TempSrc: Oral  SpO2: 97%  Weight: 249 lb 9.6 oz (113.2 kg)  Height: 5\' 5"  (1.651 m)    Exam General appearance : Awake, alert, not in any distress. Speech Clear. Not toxic looking HEENT: Atraumatic and Normocephalic, pupils equally reactive to light and accomodation Neck: Supple, no JVD. No cervical lymphadenopathy.  Chest: Good air entry bilaterally, no added sounds  CVS: S1 S2 regular, no murmurs.  Abdomen: Bowel sounds present, Non tender and not distended with no gaurding, rigidity or rebound. Extremities: B/L Lower Ext shows no edema, both legs are warm to touch Neurology: Awake alert, and oriented X 3,  Non focal Skin: No Rash  Data Review No results found for: "HGBA1C"  Assessment & Plan   1. Morbid obesity (HCC) In a boot unable to exercise only upper body. She will watch carbs .  2. Fatigue due to depression - Vitamin D, 25-hydroxy - TSH + free T4 - CBC with Differential  3. Current mild episode of major depressive disorder, unspecified whether recurrent (HCC) - traZODone (DESYREL) 50 MG tablet; Take 0.5-1 tablets (25-50 mg total) by mouth at bedtime as needed for sleep.  Dispense: 30 tablet; Refill: 3  4. Insufficiency of left posterior tibial tendon Followed by Dr.  5. Insomnia, unspecified type - traZODone (DESYREL) 50 MG tablet; Take 0.5-1 tablets (25-50 mg total) by mouth at bedtime as needed for sleep.  Dispense: 30 tablet; Refill:  3  6. Elevated LDL cholesterol level Repeat labs was not aware  - Lipid Panel    Patient have been counseled extensively about nutrition and exercise. Other issues discussed during this visit include: low cholesterol diet, weight control and daily exercise, foot care, annual eye examinations at Ophthalmology, importance of adherence with medications and regular follow-up. We also discussed long term complications of uncontrolled diabetes and hypertension.   Return in about 6 weeks (around 03/31/2022) for  depression .  The patient was given clear instructions to go to ER or return to medical center if symptoms don't improve, worsen or new problems develop. The patient verbalized understanding. The patient was told to call to get lab results if they haven't heard anything in the next week.   This note has been created with Education officer, environmental. Any transcriptional errors are unintentional.   Grayce Sessions, NP 02/17/2022, 2:41 PM

## 2022-02-17 NOTE — Patient Instructions (Signed)
Trazodone Extended-Release Tablets Qu es este medicamento? La TRAZODONA se [place] para tratar la depresin. Aumenta la cantidad de serotonina en el cerebro, una hormona que ayuda a regular el estado de nimo. Este medicamento puede ser utilizado para otros usos; si tiene alguna pregunta consulte con su proveedor de atencin mdica o con su farmacutico. MARCAS COMUNES: Nydia Bouton le debo informar a mi profesional de la salud antes de tomar este medicamento? Necesitan saber si usted presenta alguno de los siguientes problemas o situaciones: Intento de suicido o ideas suicidas Trastorno bipolar Problemas de sangrado Glaucoma Enfermedad cardiaca o ataque cardiaco previo Frecuencia cardiaca irregular Enfermedad renal Enfermedad heptica Niveles bajos de sodio en la sangre Una reaccin alrgica o inusual a la trazodona, a otros medicamentos, alimentos, colorantes o conservantes Si est embarazada o buscando quedar embarazada Si est amamantando a un beb Cmo debo Visual merchandiser medicamento? Tome este medicamento por va oral con un vaso de agua. Siga las instrucciones de la etiqueta del [place]. Tome este medicamento con el estmago vaco, al menos 30 minutos antes o 2 horas despus de comer. No lo tome con alimentos. No triture ni [employer]. Puede partir por la mitad por la lnea [place]. Tome su medicamento a la hora de [employer]. No use su medicamento con una frecuencia mayor a la indicada. No deje de usar [employer]PL [employer] de repente a menos que as lo indique su equipo de atencin. Dejar de Chemical engineer este medicamento demasiado rpido puede causar efectos secundarios graves o podra empeorar su afeccin. Su farmacutico le dar una Gua del medicamento especial (MedGuide, nombre en ingls) con cada receta y en cada ocasin que la vuelva a surtir. Asegrese de leer esta informacin cada vez cuidadosamente. Hable con su equipo de atencin sobre el uso de este  medicamento en nios. Puede requerir atencin especial. Sobredosis: Pngase en contacto inmediatamente con un centro toxicolgico o una sala de urgencia si usted cree que haya tomado demasiado medicamento. ATENCIN: [employer] es solo para usted. No comparta este medicamento con nadie. Qu sucede si me olvido de una dosis? Si olvida una dosis, tmela lo antes posible. Si es casi la hora de la prxima dosis, tome slo esa dosis. No tome dosis adicionales o dobles. Qu puede interactuar con este medicamento? No use este medicamento con ninguno de los siguientes productos: Ciertos medicamentos para infecciones micticas, tales como fluconazol, itraconazol, ketoconazol, posaconazol, voriconazol Cisaprida Dronedarona Linezolida IMAO, tales como [place], [place], [place], [place] y Parnate Mesoridazina Azul de metileno (inyectado en una vena) Pimozida Saquinavir Tioridazina Este medicamento tambin podra Product/process development scientist con los siguientes productos: Alcohol Medicamentos antivirales para el VIH o SIDA Aspirina y otros medicamentos tipo aspirina Barbitricos, como fenobarbital Ciertos medicamentos para la presin arterial, enfermedad cardiaca y frecuencia cardiaca irregular Ciertos medicamentos para la depresin, ansiedad o trastornos psicticos Ciertos medicamentos para la migraa, tales como almotriptn, eletriptn, frovatriptn, naratriptn, rizatriptn, sumatriptn, zolmitriptn Ciertos medicamentos para convulsiones, tales como carbamazepina, fenitona Ciertos medicamentos para dormir Ciertos medicamentos que tratan o previenen cogulos sanguneos, tales como dalteparina, enoxaparina y warfarina Digoxina Fentanilo Litio AINE, medicamentos para Chief Technology Officer y la inflamacin, tales como ibuprofeno o naproxeno Otros medicamentos que prolongan el intervalo QT (causan un ritmo cardiaco anormal) como dofetilida Rasagilina Suplementos tales como hierba de [place], kava kava y  valeriana Tramadol Triptfano Puede ser que esta lista no menciona todas las posibles interacciones. Informe a su profesional de [address] de [employer] productos a base de hierbas, medicamentos de [place]  o suplementos nutritivos que est tomando. Si usted fuma, consume bebidas alcohlicas o si utiliza drogas ilegales, indqueselo tambin a su profesional de [employer]. Algunas sustancias pueden interactuar con su medicamento. A qu debo estar atento al usar PPL Corporation? Si los sntomas no mejoran o si empeoran, consulte con su equipo de atencin. Visite a su equipo de atencin para que revise su evolucin peridicamente. Debido a que podran necesitarse varias semanas para ver los efectos completos de [place], es importante que contine con el tratamiento segn las indicaciones de su equipo de atencin. Preste atencin para Landscape architect aparicin o el empeoramiento de la depresin o las ideas suicidas. Esto incluye cambios repentinos en el estado de nimo, comportamientos o pensamientos. Estos cambios pueden suceder en cualquier momento, pero son ms frecuentes durante el inicio del tratamiento o despus de un cambio en la dosis. Llame a su equipo de atencin de inmediato si tiene estos pensamientos o empeora su depresin. Podran producirse episodios manacos en pacientes con trastorno bipolar que [employer]. Es necesario estar atento ante Medtronic o comportamientos, tales como sentirse ansioso, agitado, con pnico, irritable, hostil, agresivo, impulsivo, muy inquieto, excitado en exceso e hiperactivo, o tener dificultad para dormir. Estos cambios pueden suceder en cualquier momento, pero son ms frecuentes durante el inicio del tratamiento o despus de un cambio en la dosis. Llame a su equipo de atencin de inmediato si nota alguno de estos sntomas. Puede experimentar somnolencia o mareos. No conduzca, no utilice maquinaria ni haga nada que Scientist, research (life sciences) en  estado de alerta hasta que sepa cmo le afecta este medicamento. No se siente ni se ponga de pie con rapidez, especialmente si es un paciente de edad avanzada. Esto reduce el riesgo de mareos o [employer]. El alcohol podra interferir con el efecto de [place]. Evite consumir bebidas alcohlicas. Este medicamento puede resecarle los ojos y provocar visin borrosa. Si [place] lentes de contacto, puede sentir ciertas molestias. Las gotas lubricantes pueden ser tiles. Si el problema no desaparece o es grave, consulte a su mdico de los ojos. Se le podra secar la boca. Masticar chicle sin azcar o chupar caramelos duros y beber agua en abundancia podra ser de [place]. Si el problema no desaparece o es grave, consulte a su equipo de atencin. Qu efectos secundarios puedo tener al [employer] este medicamento? Efectos secundarios que debe informar a su equipo de atencin tan pronto como sea posible: Reacciones alrgicas: erupcin cutnea, comezn/picazn, urticaria, hinchazn de la cara, los labios, la lengua o la garganta Sangrado: heces con [place], o de color negro y aspecto alquitranado, orina de color rojo o marrn oscuro, vomitar sangre o material marrn que tiene el aspecto de posos (residuos) de caf, pequeas manchas rojas o moradas en la piel, sangrado o moretones inusuales Cambios en el ritmo cardiaco: frecuencia cardiaca rpida o irregular, mareos, sensacin de desmayo o aturdimiento, Journalist, newspaper, dificultad para respirar Presin sangunea baja: mareo, sensacin de desmayo o aturdimiento, visin borrosa Nivel bajo de sodio: debilidad muscular, fatiga, mareos, dolor de cabeza, confusin Ereccin prolongada o dolorosa Sndrome serotoninrgico (de la serotonina): irritabilidad, confusin, frecuencia cardiaca rpida o irregular, rigidez muscular, tics musculares, sudoracin, fiebre alta, convulsiones, escalofros, vmito, diarrea Dolor repentino en los ojos o cambio en la visin como visin  borrosa, ver halos alrededor Assurant, prdida de visin Ideas suicidas o de autolesionarse, empeoramiento del [place] de nimo, sensacin de depresin Efectos secundarios que generalmente no requieren Psychologist, prison and probation services (debe  informarlos a su equipo de atencin si persisten o si son molestos): Cambios en el deseo o desempeo sexual Estreimiento Music therapist seca Puede ser que esta lista no menciona todos los posibles efectos secundarios. Comunquese a su mdico por asesoramiento mdico [employer]lett[employer]ard. Usted puede informar los efectos secundarios a la FDA por telfono al 1-800-FDA-1088. Dnde debo guardar mi medicina? Mantenga fuera del alcance de nios y Neurosurgeon. Guarde a [employer], entre 15 y 30 grados Celsius (59 y 34 grados Fahrenheit). Proteja de Statistician. Mantenga el recipiente bien cerrado. Deseche todo el medicamento que no haya utilizado despus de la fecha de vencimiento. ATENCIN: Este folleto es un resumen. Puede ser que no cubra toda la posible informacin. Si usted tiene preguntas acerca de esta medicina, consulte con su mdico, su farmacutico o su profesional de Radiographer, therapeutic.  2023 Elsevier/Gold Standard (2021-03-19 00:00:00)

## 2022-02-18 ENCOUNTER — Other Ambulatory Visit (INDEPENDENT_AMBULATORY_CARE_PROVIDER_SITE_OTHER): Payer: Self-pay | Admitting: Primary Care

## 2022-02-18 ENCOUNTER — Other Ambulatory Visit: Payer: Self-pay

## 2022-02-18 DIAGNOSIS — E559 Vitamin D deficiency, unspecified: Secondary | ICD-10-CM

## 2022-02-18 LAB — CBC WITH DIFFERENTIAL/PLATELET
Basophils Absolute: 0.1 10*3/uL (ref 0.0–0.2)
Basos: 1 %
EOS (ABSOLUTE): 0.2 10*3/uL (ref 0.0–0.4)
Eos: 3 %
Hematocrit: 45.4 % (ref 34.0–46.6)
Hemoglobin: 15.2 g/dL (ref 11.1–15.9)
Immature Grans (Abs): 0 10*3/uL (ref 0.0–0.1)
Immature Granulocytes: 0 %
Lymphocytes Absolute: 2.4 10*3/uL (ref 0.7–3.1)
Lymphs: 41 %
MCH: 30.8 pg (ref 26.6–33.0)
MCHC: 33.5 g/dL (ref 31.5–35.7)
MCV: 92 fL (ref 79–97)
Monocytes Absolute: 0.4 10*3/uL (ref 0.1–0.9)
Monocytes: 7 %
Neutrophils Absolute: 2.8 10*3/uL (ref 1.4–7.0)
Neutrophils: 48 %
Platelets: 204 10*3/uL (ref 150–450)
RBC: 4.94 x10E6/uL (ref 3.77–5.28)
RDW: 12.7 % (ref 11.7–15.4)
WBC: 5.8 10*3/uL (ref 3.4–10.8)

## 2022-02-18 LAB — LIPID PANEL
Chol/HDL Ratio: 3.1 ratio (ref 0.0–4.4)
Cholesterol, Total: 218 mg/dL — ABNORMAL HIGH (ref 100–199)
HDL: 71 mg/dL (ref >39)
LDL Chol Calc (NIH): 129 mg/dL — ABNORMAL HIGH (ref 0–99)
Triglycerides: 104 mg/dL (ref 0–149)
VLDL Cholesterol Cal: 18 mg/dL (ref 5–40)

## 2022-02-18 LAB — TSH+FREE T4
Free T4: 1.2 ng/dL (ref 0.82–1.77)
TSH: 1.94 u[IU]/mL (ref 0.450–4.500)

## 2022-02-18 LAB — VITAMIN D 25 HYDROXY (VIT D DEFICIENCY, FRACTURES): Vit D, 25-Hydroxy: 17.9 ng/mL — ABNORMAL LOW (ref 30.0–100.0)

## 2022-02-18 MED ORDER — ERGOCALCIFEROL 1.25 MG (50000 UT) PO CAPS
50000.0000 [IU] | ORAL_CAPSULE | ORAL | 0 refills | Status: DC
  Filled 2022-02-18: qty 10, 70d supply, fill #0

## 2022-02-18 MED ORDER — VITAMIN D3 50 MCG (2000 UT) PO CAPS
2000.0000 [IU] | ORAL_CAPSULE | Freq: Every day | ORAL | 1 refills | Status: DC
  Filled 2022-02-18: qty 90, 90d supply, fill #0

## 2022-02-20 ENCOUNTER — Other Ambulatory Visit: Payer: Self-pay

## 2022-02-23 ENCOUNTER — Other Ambulatory Visit: Payer: Self-pay

## 2022-02-26 ENCOUNTER — Ambulatory Visit (INDEPENDENT_AMBULATORY_CARE_PROVIDER_SITE_OTHER): Payer: Self-pay | Admitting: Orthopedic Surgery

## 2022-02-26 DIAGNOSIS — M76822 Posterior tibial tendinitis, left leg: Secondary | ICD-10-CM

## 2022-02-27 ENCOUNTER — Encounter: Payer: Self-pay | Admitting: Orthopedic Surgery

## 2022-02-27 ENCOUNTER — Telehealth (INDEPENDENT_AMBULATORY_CARE_PROVIDER_SITE_OTHER): Payer: Self-pay

## 2022-02-27 NOTE — Telephone Encounter (Signed)
-----   Message from Grayce Sessions, NP sent at 02/18/2022  9:22 AM EDT ----- Vitamin D is low . Vitamin D is needed to make and keep bones strong. The patient will need to take a prescription strength vitamin D tablet once weekly for 10 weeks. Then start vitamin D3 2000iu daily.  Your cholesterol higher than expected. High cholesterol may increase risk of heart attack and/or stroke. Consider eating more fruits, vegetables, and lean baked meats such as chicken or fish. Moderate intensity exercise at least 150 minutes as tolerated per week may help as well.  Try life style modification before a prescription is needed.  You may purchase OTC Omega 3 fatty acid.

## 2022-02-27 NOTE — Telephone Encounter (Signed)
Called with pacific interpreter [phone]) left detailed voicemail per DPR informing of results and medications and diet advise per PCP. Maryjean Morn, CMA

## 2022-02-27 NOTE — Progress Notes (Signed)
   Office Visit Note   Patient: Monica Peters           Date of Birth: [birth year]           MRN: [id number] Visit Date: 02/26/2022              Requested by: Grayce Sessions, NP [address] [place],  [place] [address] PCP: Grayce Sessions, NP  Chief Complaint  Patient presents with   Left Ankle - Follow-up    PTTI      HPI: Patient is a 36 year old woman who presents in follow-up for posterior tibial tendon insufficiency on the left.  Patient states she feels a little better when the fracture boot.  Assessment & Plan: Visit Diagnoses:  1. Insufficiency of left posterior tibial tendon     Plan: We will advance her to a ankle stabilizing orthosis.  Follow-Up Instructions: Return in about 4 weeks (around 03/26/2022).   Ortho Exam  Patient is alert, oriented, no adenopathy, well-dressed, normal affect, normal respiratory effort. Examination patient has a good pulse she has a pronated valgus foot with tenderness still to palpation over the posterior tibial tendon.  Imaging: No results found. No images are attached to the encounter.  Labs: No results found for: "HGBA1C", "ESRSEDRATE", "CRP", "LABURIC", "REPTSTATUS", "GRAMSTAIN", "CULT", "LABORGA"   Lab Results  Component Value Date   ALBUMIN 4.7 05/08/2021   ALBUMIN 4.6 09/10/2020    No results found for: "MG" Lab Results  Component Value Date   VD25OH 17.9 (L) 02/17/2022    No results found for: "PREALBUMIN"    Latest Ref Rng & Units 02/17/2022    9:54 AM 09/10/2020   10:21 AM 04/24/2020   10:36 AM  CBC EXTENDED  WBC 3.4 - 10.8 x10E3/uL 5.8  5.1  4.8   RBC 3.77 - 5.28 x10E6/uL 4.94  4.51  4.69   Hemoglobin 11.1 - 15.9 g/dL 63.8  46.6  59.9   HCT 34.0 - 46.6 % 45.4  40.5  42.9   Platelets 150 - 450 x10E3/uL 204  291  249   NEUT# 1.4 - 7.0 x10E3/uL 2.8  2.2  2.2   Lymph# 0.7 - 3.1 x10E3/uL 2.4  2.3  2.2      There is no height or weight on file to calculate BMI.  Orders:  No orders  of the defined types were placed in this encounter.  No orders of the defined types were placed in this encounter.    Procedures: No procedures performed  Clinical Data: No additional findings.  ROS:  All other systems negative, except as noted in the HPI. Review of Systems  Objective: Vital Signs: LMP 02/03/2022 (Approximate)   Specialty Comments:  No specialty comments available.  PMFS History: Patient Active Problem List   Diagnosis Date Noted   Abnormal uterine bleeding (AUB) 09/19/2020   Viral upper respiratory tract infection 08/11/2020   Nausea 08/11/2020   Dyspnea 05/14/2020   History reviewed. No pertinent past medical history.  History reviewed. No pertinent family history.  Past Surgical History:  Procedure Laterality Date   CHOLECYSTECTOMY  2021   Social History   Occupational History   Not on file  Tobacco Use   Smoking status: Never   Smokeless tobacco: Never  Substance and Sexual Activity   Alcohol use: Never   Drug use: Never   Sexual activity: Yes    Birth control/protection: Pill

## 2022-03-26 ENCOUNTER — Ambulatory Visit (INDEPENDENT_AMBULATORY_CARE_PROVIDER_SITE_OTHER): Payer: Self-pay | Admitting: Orthopedic Surgery

## 2022-03-26 DIAGNOSIS — M76822 Posterior tibial tendinitis, left leg: Secondary | ICD-10-CM

## 2022-03-27 ENCOUNTER — Encounter: Payer: Self-pay | Admitting: Orthopedic Surgery

## 2022-03-27 NOTE — Progress Notes (Signed)
Office Visit Note   Patient: Monica Peters           Date of Birth: 1986/02/29           MRN: [id number] Visit Date: 03/26/2022              Requested by: Grayce Sessions, NP [address] [place],  [place] [address] PCP: Grayce Sessions, NP  Chief Complaint  Patient presents with   Left Ankle - Follow-up    PTTD      HPI: Patient is a 36 year old woman who presents in follow-up for left ankle posterior tibial tendon insufficiency.  She presents in an ankle stabilizing orthosis in flip-flops.  Patient states that her foot feels about the same no improvement with the ASO.  Patient states that this is also causing tenderness in her knee secondary to altered gait.  Assessment & Plan: Visit Diagnoses:  1. Insufficiency of left posterior tibial tendon     Plan: Discussed continued conservative therapy versus surgical intervention.  Patient states she would like to continue with her conservative therapy.  She will resume wearing the fracture boot.  Follow-Up Instructions: Return in about 4 weeks (around 04/23/2022).   Ortho Exam  Patient is alert, oriented, no adenopathy, well-dressed, normal affect, normal respiratory effort. Examination patient has a pronated valgus foot she has pain to palpation of the sinus Tarsi with lateral impingement she has a good dorsalis pedis pulse she has pain to palpation of the posterior tibial tendon and cannot do a single limb heel raise.  Imaging: No results found. No images are attached to the encounter.  Labs: No results found for: "HGBA1C", "ESRSEDRATE", "CRP", "LABURIC", "REPTSTATUS", "GRAMSTAIN", "CULT", "LABORGA"   Lab Results  Component Value Date   ALBUMIN 4.7 05/08/2021   ALBUMIN 4.6 09/10/2020    No results found for: "MG" Lab Results  Component Value Date   VD25OH 17.9 (L) 02/17/2022    No results found for: "PREALBUMIN"    Latest Ref Rng & Units 02/17/2022    9:54 AM 09/10/2020   10:21 AM 04/24/2020    10:36 AM  CBC EXTENDED  WBC 3.4 - 10.8 x10E3/uL 5.8  5.1  4.8   RBC 3.77 - 5.28 x10E6/uL 4.94  4.51  4.69   Hemoglobin 11.1 - 15.9 g/dL 26.9  48.5  46.2   HCT 34.0 - 46.6 % 45.4  40.5  42.9   Platelets 150 - 450 x10E3/uL 204  291  249   NEUT# 1.4 - 7.0 x10E3/uL 2.8  2.2  2.2   Lymph# 0.7 - 3.1 x10E3/uL 2.4  2.3  2.2      There is no height or weight on file to calculate BMI.  Orders:  No orders of the defined types were placed in this encounter.  No orders of the defined types were placed in this encounter.    Procedures: No procedures performed  Clinical Data: No additional findings.  ROS:  All other systems negative, except as noted in the HPI. Review of Systems  Objective: Vital Signs: There were no vitals taken for this visit.  Specialty Comments:  No specialty comments available.  PMFS History: Patient Active Problem List   Diagnosis Date Noted   Abnormal uterine bleeding (AUB) 09/19/2020   Viral upper respiratory tract infection 08/11/2020   Nausea 08/11/2020   Dyspnea 05/14/2020   History reviewed. No pertinent past medical history.  History reviewed. No pertinent family history.  Past Surgical History:  Procedure Laterality Date  CHOLECYSTECTOMY  2021   Social History   Occupational History   Not on file  Tobacco Use   Smoking status: Never   Smokeless tobacco: Never  Substance and Sexual Activity   Alcohol use: Never   Drug use: Never   Sexual activity: Yes    Birth control/protection: Pill

## 2022-03-31 ENCOUNTER — Ambulatory Visit (INDEPENDENT_AMBULATORY_CARE_PROVIDER_SITE_OTHER): Payer: Self-pay | Admitting: Primary Care

## 2022-03-31 ENCOUNTER — Encounter (INDEPENDENT_AMBULATORY_CARE_PROVIDER_SITE_OTHER): Payer: Self-pay | Admitting: Primary Care

## 2022-03-31 ENCOUNTER — Other Ambulatory Visit: Payer: Self-pay

## 2022-03-31 VITALS — BP 139/79 | HR 75 | Temp 98.4°F | Ht 66.0 in | Wt 249.0 lb

## 2022-03-31 DIAGNOSIS — F320 Major depressive disorder, single episode, mild: Secondary | ICD-10-CM

## 2022-03-31 DIAGNOSIS — G4700 Insomnia, unspecified: Secondary | ICD-10-CM

## 2022-03-31 MED ORDER — TRAZODONE HCL 50 MG PO TABS
50.0000 mg | ORAL_TABLET | Freq: Every evening | ORAL | 1 refills | Status: DC | PRN
  Filled 2022-03-31: qty 30, 30d supply, fill #0

## 2022-03-31 NOTE — Progress Notes (Signed)
[facility]  Monica Peters, is a 36 y.o. female  SWF:[id number]  UKG:[id number]  DOB - October 13, 1985  Chief Complaint  Patient presents with   Follow-up    Depression FU, Ear itches and pinching feeling in right ear       Subjective:   Monica Peters is a 36 y.o. Hispanic female here today for a follow up visit for depression and insomnia. She is taking 1/2 of 50mg  of Trazodone which makes her sleep well but the crying and loneliness continues  mostly at night. Patient has No headache, No chest pain, No abdominal pain - No Nausea, No new weakness tingling or numbness, No Cough - shortness of breath  No problems updated.  No Known Allergies  History reviewed. No pertinent past medical history.  Current Outpatient Medications on File Prior to Visit  Medication Sig Dispense Refill   albuterol (VENTOLIN HFA) 108 (90 Base) MCG/ACT inhaler Inhale 2 puffs into the lungs every 6 (six) hours as needed for wheezing or shortness of breath. 8 g 0   Cholecalciferol (VITAMIN D3) 50 MCG (2000 UT) capsule Take 1 capsule (2,000 Units total) by mouth daily. 90 capsule 1   nabumetone (RELAFEN) 750 MG tablet Take 1 tablet (750 mg total) by mouth 2 (two) times daily as needed for mild pain or moderate pain. with food 60 tablet 3   omeprazole (PRILOSEC) 20 MG capsule Take 1 capsule (20 mg total) by mouth daily. 30 capsule 3   cetirizine (ZYRTEC) 10 MG tablet Take 1 tablet (10 mg total) by mouth daily. (Patient not taking: Reported on 03/31/2022) 30 tablet 11   ergocalciferol (VITAMIN D2) 1.25 MG (50000 UT) capsule Take 1 capsule (50,000 Units total) by mouth once a week. (Patient not taking: Reported on 03/31/2022) 10 capsule 0   Norgestimate-Ethinyl Estradiol Triphasic 0.18/0.215/0.25 MG-35 MCG tablet TAKE 1 TABLET BY MOUTH DAILY. 28 tablet 11   [DISCONTINUED] levonorgestrel-ethinyl estradiol (NORDETTE) 0.15-30 MG-MCG tablet Take 1 tablet by mouth daily. (Patient not taking:  Reported on 11/07/2020) 28 tablet 11   No current facility-administered medications on file prior to visit.    Objective:   Vitals:   03/31/22 0930  BP: 139/79  Pulse: 75  Temp: 98.4 F (36.9 C)  TempSrc: Oral  SpO2: 98%  Weight: 249 lb (112.9 kg)  Height: 5\' 6"  (1.676 m)    Exam General appearance : Awake, alert, not in any distress. Speech Clear. Not toxic looking HEENT: Atraumatic and Normocephalic, pupils equally reactive to light and accomodation Neck: Supple, no JVD. No cervical lymphadenopathy.  Chest: Good air entry bilaterally, no added sounds  CVS: S1 S2 regular, no murmurs.  Abdomen: Bowel sounds present, Non tender and not distended with no gaurding, rigidity or rebound. Extremities:  Lower Ext shows no edema,on left on the right leg is in unaboot   Neurology: Awake alert, and oriented X 3,  Non focal Skin: No Rash  Data Review No results found for: "HGBA1C"  Assessment & Plan   Alera was seen today for follow-up.  Diagnoses and all orders for this visit:  Current mild episode of major depressive disorder, unspecified whether recurrent (HCC) Increase trazodone from 1/2 to 1 pill at bedtime - feels better but depression is worst at night F/u with CSW  Insomnia, unspecified type Trazodone is providing 8hrs restful sleep   Patient have been counseled extensively about nutrition and exercise. Other issues discussed during this visit include: low cholesterol diet, weight control and daily exercise, foot  care, annual eye examinations at Ophthalmology, importance of adherence with medications and regular follow-up. We also discussed long term complications of uncontrolled diabetes and hypertension.   Return in about 3 months (around 07/01/2022) for depression/ bp ck.  The patient was given clear instructions to go to ER or return to medical center if symptoms don't improve, worsen or new problems develop. The patient verbalized understanding. The patient was  told to call to get lab results if they haven't heard anything in the next week.   This note has been created with Education officer, environmental. Any transcriptional errors are unintentional.   Grayce Sessions, NP 03/31/2022, 12:04 PM

## 2022-03-31 NOTE — Patient Instructions (Signed)
Prevencin de la hipertensin Preventing Hypertension La hipertensin, tambin conocida como presin arterial alta, se produce cuando la sangre bombea en las arterias con demasiada fuerza. Las arterias son vasos sanguneos que transportan la sangre desde el corazn al resto del cuerpo. Con frecuencia, la hipertensin no causa sntomas hasta que la presin arterial es muy alta. Es importante que controle regularmente su presin arterial. Los cambios en la dieta y el estilo de vida pueden ayudar a prevenir la hipertensin y a hacerlo sentir mejor al mejorar su calidad de vida. Si ya tiene hipertensin, puede controlarla con cambios en la dieta y el estilo de vida y con medicamentos. Cmo puede afectarme esta enfermedad? Con el transcurso del tiempo, la hipertensin puede daar las arterias y disminuir el flujo de sangre hacia partes importantes del cuerpo que incluyen el cerebro, el corazn y los riones. Si mantiene su presin arterial en un nivel saludable, podr prevenir complicaciones como un infarto de miocardio, insuficiencia cardaca, un accidente cerebrovascular, insuficiencia renal y demencia vascular. Qu puede aumentar el riesgo? Una alimentacin poco saludable y la falta de actividad fsica pueden aumentar las probabilidades de tener presin arterial alta. Algunos otros factores de riesgo son los siguientes: Edad. El riesgo aumenta con la edad. Tener familiares que han tenido presin arterial alta. Tener ciertas afecciones, como problemas de tiroides. Tener sobrepeso u obesidad. Consumir cafena o alcohol en exceso. Consumir mucha grasa, azcar, caloras o sal (sodio) en su dieta. Fumar o consumir drogas ilegales. Tomar ciertos medicamentos, como antidepresivos, descongestivos, pldoras anticonceptivas y antiinflamatorios no esteroideos (AINE), como el ibuprofeno. Qu medidas puedo tomar para prevenir o controlar esta afeccin? Trabaje junto al mdico para desarrollar un plan de  prevencin de la hipertensin que funcione para usted. Es posible que lo deriven para que reciba asesoramiento sobre una dieta saludable y actividad fsica. Siga su plan y asista a todas las visitas de seguimiento. Cambios en la dieta Siga una dieta saludable. Esto puede comprender lo siguiente: Menor ingesta de sal (sodio). Pregntele al mdico cunto sodio puede consumir de forma segura. La recomendacin general es consumir menos de 1 cucharadita (2300 mg) de sodio por da. No agregue sal a las comidas. Opte por alimentos con bajo contenido de sodio cuando realice las compras o coma fuera de casa. Limite la cantidad de grasa en la dieta. Esto se puede lograr con lcteos descremados o de bajo contenido de grasas e ingiriendo menor cantidad de carnes rojas. Coma ms frutas, verduras y cereales integrales. Establezca un objetivo para comer: 1 a 2 tazas de frutas y verduras frescas todos los das. 3 a 4 porciones de cereales integrales todos los das. Evite los alimentos y las bebidas que tengan azcares agregados. Coma pescados que contengan grasas saludables (cidos grasos omega-3), como la caballa o el salmn. Si necesita implementar un plan de comidas saludable, pruebe la dieta DASH. Esta dieta tiene un alto contenido de frutas, verduras y cereales integrales. Incluye poca cantidad de sodio, carnes rojas y azcares agregados. DASH es la sigla en ingls de "Enfoques Alimentarios para Detener la Hipertensin". Cambios en el estilo de vida  Baje de peso si es necesario. Con tan solo bajar entre el 3 % y el 5 % del peso corporal, puede prevenir o controlar la hipertensin. Por ejemplo, si su peso actual es de 200 libras (91 kg), una prdida entre el 3 % y el 5 % de su peso significa perder entre 6 y 10 libras (2.7 a 4.5 kg). Pdale al mdico que le   recomiende una dieta y un plan de ejercicios para bajar de peso de forma segura. Ejerctate lo suficiente. Debe realizar al menos 150 minutos de ejercicios  de intensidad moderada todas las semanas. Puede realizar este tiempo en sesiones cortas de ejercicios, varias veces al da, o puede realizar sesiones ms largas, pero menos veces por semana. Por ejemplo, puede realizar una caminata enrgica o andar en bicicleta durante 10 minutos, 3 veces al da, durante 5 das a la semana. Encuentre maneras de reducir el estrs, como hacer ejercicios, meditar, escuchar msica o tomar una clase de yoga. Si necesita ayuda para reducir el nivel de estrs, consulte al mdico. No consuma ningn producto que contenga nicotina o tabaco. Estos productos incluyen cigarrillos, tabaco para mascar y aparatos de vapeo, como los cigarrillos electrnicos. Las sustancias qumicas presentes en los productos con tabaco y nicotina elevan su presin arterial cada vez que los consume. Si necesita ayuda para dejar de consumir estos productos, consulte al mdico. Aprenda a medir su presin arterial en casa. Asegrese de conocer su objetivo de presin arterial, como se lo haya indicado el mdico. Trate de dormir entre 7 y 9 horas todas las noches. Consumo de alcohol No beba alcohol si: Su mdico le indica no hacerlo. Est embarazada, puede estar embarazada o est tratando de quedar embarazada. Si bebe alcohol: Limite la cantidad que bebe a lo siguiente: De 0 a 1 medida por da para las mujeres. De 0 a 2 medidas por da para los hombres. Sepa cunta cantidad de alcohol hay en las bebidas que toma. En los [place], una medida equivale a una botella de cerveza de 12 oz (355 ml), un vaso de vino de 5 oz (148 ml) o un vaso de una bebida alcohlica de alta graduacin de 1 oz (44 ml). Medicamentos Adems de los cambios en la dieta y el estilo de vida, el mdico podr indicarle medicamentos para ayudarle a bajar su presin arterial. En general: Tal vez deba probar distintos medicamentos hasta encontrar el ms adecuado para usted. Quiz necesite tomar ms de un medicamento. Use los  medicamentos de venta libre y los recetados solamente como se lo haya indicado el mdico. Preguntas para hacerle al mdico Cul es mi presin arterial ideal? Cmo disminuyo mi riesgo de tener presin arterial alta? Cmo debo controlar mi presin arterial en casa? Dnde obtener apoyo Su mdico puede ayudarle a prevenir la hipertensin y mantener su presin arterial en un nivel saludable. Su hospital o comunidad locales tambin pueden proporcionarle servicios y programas de prevencin. La American Heart Association (Asociacin Estadounidense del Corazn) ofrece un red de ayuda en lnea en [email or web address] Dnde obtener ms informacin Obtenga ms informacin sobre la hipertensin en: National Heart, Lung, and Blood Institute (Instituto Nacional del Corazn, los Pulmones y la Sangre): www.nhlbi.nih.gov Centers for Disease Control and Prevention (Centros para el Control y la Prevencin de Enfermedades): www.cdc.gov American Academy of Family Physicians (Academia Estadounidense de Mdicos de Familia): familydoctor.org Obtenga ms informacin sobre la dieta DASH en: National Heart, Lung, and Blood Institute (Instituto Nacional del Corazn, los Pulmones y la Sangre): www.nhlbi.nih.gov Comunquese con un mdico si: Piensa que tiene una reaccin alrgica a los medicamentos que ha tomado. Tiene mareos o dolores de cabeza con recurrencia. Tiene hinchazn en los tobillos. Tiene problemas de visin. Solicite ayuda de inmediato si: Tiene un dolor o malestar repentino o intenso en el pecho, la espalda o el abdomen. Le falta el aire. Tienes un dolor de cabeza repentino e intenso. Estos sntomas pueden   indicar una emergencia. Solicite ayuda de inmediato. Llame al 911. No espere a ver si los sntomas desaparecen. No conduzca por sus propios medios hasta el hospital. Resumen La hipertensin con frecuencia no provoca sntomas hasta que la presin arterial es muy alta. Es importante que controle  regularmente su presin arterial. Los cambios en la dieta y el estilo de vida son pasos importantes para prevenir la hipertensin. Si mantiene su presin arterial en un nivel saludable, podr prevenir complicaciones como un infarto de miocardio, insuficiencia cardaca, un accidente cerebrovascular e insuficiencia renal. Trabaje junto al mdico para desarrollar un plan de prevencin de la hipertensin que funcione para usted. Esta informacin no tiene como fin reemplazar el consejo del mdico. Asegrese de hacerle al mdico cualquier pregunta que tenga. Document Revised: 07/02/2021 Document Reviewed: 07/02/2021 Elsevier Patient Education  2023 Elsevier Inc.  

## 2022-04-01 ENCOUNTER — Ambulatory Visit: Payer: Self-pay | Admitting: Physician Assistant

## 2022-04-01 ENCOUNTER — Other Ambulatory Visit: Payer: Self-pay

## 2022-04-01 VITALS — BP 125/79 | HR 80 | Resp 18 | Wt 249.0 lb

## 2022-04-01 DIAGNOSIS — H9201 Otalgia, right ear: Secondary | ICD-10-CM

## 2022-04-01 NOTE — Progress Notes (Unsigned)
   Established Patient Office Visit  Subjective   Patient ID: Monica Peters, female    DOB: 09-28-1985  Age: 36 y.o. MRN: [id number]  No chief complaint on file.   Itching in the rightear - pinching for the last month  Same way as feeling on the left side      {History (Optional):23778}  ROS    Objective:     LMP 03/29/2022 (Approximate)  {Vitals History (Optional):23777}  Physical Exam   No results found for any visits on 04/01/22.  {Labs (Optional):23779}  The ASCVD Risk score (Arnett DK, et al., 2019) failed to calculate for the following reasons:   The 2019 ASCVD risk score is only valid for ages 51 to 86    Assessment & Plan:   Problem List Items Addressed This Visit   None   No follow-ups on file.    Kasandra Knudsen Mayers, PA-C

## 2022-04-01 NOTE — Patient Instructions (Signed)
I encourage you to restart the zyrtec and take it on a daily basis  Roney Jaffe, PA-C Physician Assistant [facility] [email or web address]   Acumulacin de cera en el odo en adultos Earwax Buildup, Adult Los odos producen una sustancia llamada cera que ayuda a evitar que ingresen bacterias en el odo y protege la piel del canal [place]. En ocasiones, la cera se puede acumular en el odo y causar molestias o prdida de la audicin. Cules son las causas? Esta afeccin es causada por una acumulacin de cera. Los canales auditivos se limpian por s solos. La cera de los odos se produce en la parte externa del canal auditivo y generalmente cae hacia afuera en pequeas cantidades con el tiempo. Cuando el mecanismo de autolimpieza no funciona, la cera se acumula y eso puede reducir la audicin y Merchant navy officer. Tratar de [employer] odos con hisopos puede empujar la cera hacia el interior del canal auditivo y Programmer, multimedia y disminucin de la audicin. Qu incrementa el riesgo? Es ms probable que Dietitian en las personas que presentan alguna de estas caractersticas: Se limpian frecuentemente los odos con hisopos. Se escarban los odos. Utilizan tapones para los odos o auriculares internos con frecuencia, o usan audfonos. Los siguientes factores tambin pueden hacer que usted sea ms propenso a Aeronautical engineer afeccin: Ser hombre. Ser Neomia Dear persona de edad avanzada. Producir ms cera de forma natural. Tener los canales [employer]. Tener cera que es demasiado densa o pegajosa. Tener vello excesivo en el Insurance risk surveyor. Tener eccema. Estar deshidratado. Cules son los signos o sntomas? Los sntomas de esta afeccin incluyen: Disminucin de la audicin. Sensacin de que el odo est lleno u obstruido. Secrecin de lquido. Dolor o Development worker, community odo. Zumbidos en el odo. Tos. Trastornos del  equilibrio. Porcin de cera que se puede observar en el interior del canal auditivo. Cmo se diagnostica? Esta afeccin se puede diagnosticar en funcin de lo siguiente: Sus sntomas. Sus antecedentes mdicos. Un examen de odo. Durante el examen, el mdico mirar dentro de su odo con un instrumento llamado otoscopio. Pueden hacerle otros estudios, como una prueba de audicin. Cmo se trata? El tratamiento para esta afeccin puede incluir lo siguiente: Gotas ticas para ablandar la cera. Extraccin de cera realizada por un mdico. El mdico tambin podr hacer lo siguiente: Enjuagar el odo con agua. Usar un instrumento con un anillo en el extremo (cureta). Usar un dispositivo de succin. Realizar una ciruga para extraer la acumulacin de cera. Esto puede Facilities manager graves. Siga estas instrucciones en su casa:  Use los medicamentos de venta libre y los recetados solamente como se lo haya indicado el mdico. No se introduzca ningn objeto en el odo, ni siquiera hisopos de algodn. La abertura del canal auditivo se puede limpiar con un pao o pauelo facial. Siga las instrucciones del mdico acerca de cmo limpiarse los odos. No se limpie los odos en exceso. Beber suficiente lquido como para Pharmacologist la orina de color amarillo plido. Esto ayudar a Industrial/product designer. Concurra a todas las visitas de seguimiento como se lo hayan indicado. Si tiene acumulacin de cera en el odo con frecuencia o [place] audfonos, visite a su mdico para que le realice una limpieza de odo de rutina y preventiva. Pregntele al mdico con qu frecuencia debe programar estas limpiezas. Si tiene audfonos, lmpielos segn las instrucciones del fabricante y de su mdico. Comunquese con un mdico si: Tiene dolor de odo. Presenta  fiebre. Le sale pus u otro lquido del odo. Tiene prdida de la audicin. Tiene zumbidos en el odo que no desaparecen. Tiene la sensacin de que la habitacin da vueltas  (vrtigo). Los sntomas no mejoran con Scientist, research (medical). Solicite ayuda de inmediato si: Le sangra el odo afectado. Tiene dolor intenso de odo. Resumen La cera se puede acumular en el odo y causar molestias o prdida de la audicin. Los sntomas ms comunes de esta afeccin incluyen una audicin reducida o [place] y la sensacin de que el odo est lleno o est obstruido. Esta afeccin se puede diagnosticar en funcin de los sntomas, sus antecedentes mdicos y un examen de odo. Esta afeccin se puede tratar mediante gotas ticas que ablandan la cera o mediante la extraccin de la cera que realiza el mdico. No se introduzca ningn objeto en el odo, ni siquiera hisopos de algodn. La abertura del canal auditivo se puede limpiar con un pao o pauelo facial. Esta informacin no tiene como fin reemplazar el consejo del mdico. Asegrese de hacerle al mdico cualquier pregunta que tenga. Document Revised: 02/15/2020 Document Reviewed: 02/15/2020 Elsevier Patient Education  2023 [employer].

## 2022-04-02 ENCOUNTER — Encounter: Payer: Self-pay | Admitting: Physician Assistant

## 2022-04-23 ENCOUNTER — Ambulatory Visit (INDEPENDENT_AMBULATORY_CARE_PROVIDER_SITE_OTHER): Payer: Self-pay | Admitting: Orthopedic Surgery

## 2022-04-23 ENCOUNTER — Other Ambulatory Visit: Payer: Self-pay

## 2022-04-23 DIAGNOSIS — M76822 Posterior tibial tendinitis, left leg: Secondary | ICD-10-CM

## 2022-04-23 MED ORDER — PREDNISONE 10 MG PO TABS
10.0000 mg | ORAL_TABLET | Freq: Every day | ORAL | 0 refills | Status: DC
  Filled 2022-04-23: qty 30, 30d supply, fill #0

## 2022-05-13 ENCOUNTER — Encounter: Payer: Self-pay | Admitting: Orthopedic Surgery

## 2022-05-13 NOTE — Progress Notes (Signed)
Office Visit Note   Patient: Monica Peters           Date of Birth: 02-Oct-1985           MRN: [id number] Visit Date: 04/23/2022              Requested by: Grayce Sessions, NP [address] 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  Patient has been using the fracture boot.  Had no relief with the ASO.  Patient has tried ambulate without the boot and this was painful at the end of the day.  Patient states she now has pain over the lateral tendons.  Assessment & Plan: Visit Diagnoses:  1. Insufficiency of left posterior tibial tendon     Plan: We will set her up for physical therapy for strengthening and a low-dose course of prednisone.  Follow-Up Instructions: Return in about 4 weeks (around 05/21/2022).   Ortho Exam  Patient is alert, oriented, no adenopathy, well-dressed, normal affect, normal respiratory effort. Examination patient has pain over the peroneal tendons and posterior tibial tendon.  There is no swelling no palpable defects no redness.  Patient has tried ibuprofen and Relafen.  Imaging: No results found. No images are attached to the encounter.  Labs: No results found for: "HGBA1C", "ESRSEDRATE", "CRP", "LABURIC", "REPTSTATUS", "GRAMSTAIN", "CULT", "LABORGA"   Lab Results  Component Value Date   ALBUMIN 4.7 05/08/2021   ALBUMIN 4.6 09/10/2020    No results found for: "MG" Lab Results  Component Value Date   VD25OH 17.9 (L) 02/17/2022    No results found for: "PREALBUMIN"    Latest Ref Rng & Units 02/17/2022    9:54 AM 09/10/2020   10:21 AM 04/24/2020   10:36 AM  CBC EXTENDED  WBC 3.4 - 10.8 x10E3/uL 5.8  5.1  4.8   RBC 3.77 - 5.28 x10E6/uL 4.94  4.51  4.69   Hemoglobin 11.1 - 15.9 g/dL 45.8  09.9  83.3   HCT 34.0 - 46.6 %  45.4  40.5  42.9   Platelets 150 - 450 x10E3/uL 204  291  249   NEUT# 1.4 - 7.0 x10E3/uL 2.8  2.2  2.2   Lymph# 0.7 - 3.1 x10E3/uL 2.4  2.3  2.2      There is no height or weight on file to calculate BMI.  Orders:  Orders Placed This Encounter  Procedures   Ambulatory referral to Physical Therapy   Meds ordered this encounter  Medications   predniSONE (DELTASONE) 10 MG tablet    Sig: Take 1 tablet (10 mg total) by mouth daily with breakfast.    Dispense:  30 tablet    Refill:  0     Procedures: No procedures performed  Clinical Data: No additional findings.  ROS:  All other systems negative, except as noted in the HPI. Review of Systems  Objective: Vital Signs: LMP 03/29/2022 (Approximate)   Specialty Comments:  No specialty comments available.  PMFS History: Patient Active Problem List   Diagnosis Date Noted   Abnormal uterine bleeding (AUB) 09/19/2020   Viral upper respiratory tract infection 08/11/2020   Nausea 08/11/2020   Dyspnea 05/14/2020   History reviewed. No pertinent past medical history.  History reviewed. No pertinent family history.  Past Surgical  History:  Procedure Laterality Date   CHOLECYSTECTOMY  2021   Social History   Occupational History   Not on file  Tobacco Use   Smoking status: Never   Smokeless tobacco: Never  Substance and Sexual Activity   Alcohol use: Never   Drug use: Never   Sexual activity: Yes    Birth control/protection: Pill

## 2022-05-15 ENCOUNTER — Encounter (HOSPITAL_COMMUNITY): Payer: Self-pay | Admitting: Obstetrics & Gynecology

## 2022-05-15 ENCOUNTER — Inpatient Hospital Stay (HOSPITAL_COMMUNITY)
Admission: AD | Admit: 2022-05-15 | Discharge: 2022-05-15 | Disposition: A | Discharge status: Home / Self Care | Payer: Self-pay | Attending: Obstetrics and Gynecology | Admitting: Obstetrics and Gynecology

## 2022-05-15 ENCOUNTER — Inpatient Hospital Stay (HOSPITAL_COMMUNITY)
Admission: AD | Admit: 2022-05-15 | Discharge: 2022-05-15 | Discharge status: Against Medical Advice | Payer: Self-pay | Attending: Obstetrics & Gynecology | Admitting: Obstetrics & Gynecology

## 2022-05-15 ENCOUNTER — Ambulatory Visit (HOSPITAL_COMMUNITY)
Admission: EM | Admit: 2022-05-15 | Discharge: 2022-05-15 | Disposition: A | Discharge status: Home / Self Care | Payer: Self-pay | Attending: Family Medicine | Admitting: Family Medicine

## 2022-05-15 ENCOUNTER — Inpatient Hospital Stay (HOSPITAL_COMMUNITY): Payer: Self-pay

## 2022-05-15 ENCOUNTER — Encounter (HOSPITAL_COMMUNITY): Payer: Self-pay | Admitting: *Deleted

## 2022-05-15 ENCOUNTER — Other Ambulatory Visit: Payer: Self-pay

## 2022-05-15 DIAGNOSIS — Z3201 Encounter for pregnancy test, result positive: Secondary | ICD-10-CM

## 2022-05-15 DIAGNOSIS — O26891 Other specified pregnancy related conditions, first trimester: Secondary | ICD-10-CM | POA: Insufficient documentation

## 2022-05-15 DIAGNOSIS — O09521 Supervision of elderly multigravida, first trimester: Secondary | ICD-10-CM | POA: Insufficient documentation

## 2022-05-15 DIAGNOSIS — Z3A01 Less than 8 weeks gestation of pregnancy: Secondary | ICD-10-CM | POA: Insufficient documentation

## 2022-05-15 DIAGNOSIS — R109 Unspecified abdominal pain: Secondary | ICD-10-CM

## 2022-05-15 DIAGNOSIS — J069 Acute upper respiratory infection, unspecified: Secondary | ICD-10-CM

## 2022-05-15 DIAGNOSIS — R059 Cough, unspecified: Secondary | ICD-10-CM | POA: Insufficient documentation

## 2022-05-15 DIAGNOSIS — Z20822 Contact with and (suspected) exposure to covid-19: Secondary | ICD-10-CM | POA: Insufficient documentation

## 2022-05-15 DIAGNOSIS — J4521 Mild intermittent asthma with (acute) exacerbation: Secondary | ICD-10-CM

## 2022-05-15 DIAGNOSIS — Z3490 Encounter for supervision of normal pregnancy, unspecified, unspecified trimester: Secondary | ICD-10-CM

## 2022-05-15 LAB — HCG, QUANTITATIVE, PREGNANCY: hCG, Beta Chain, Quant, S: 5604 m[IU]/mL — ABNORMAL HIGH (ref <5)

## 2022-05-15 LAB — WET PREP, GENITAL
Clue Cells Wet Prep HPF POC: NONE SEEN
Sperm: NONE SEEN
Trich, Wet Prep: NONE SEEN
WBC, Wet Prep HPF POC: >=10 — AB (ref <10)
Yeast Wet Prep HPF POC: NONE SEEN

## 2022-05-15 LAB — CBC WITH DIFFERENTIAL/PLATELET
Abs Immature Granulocytes: 0.04 10*3/uL (ref 0.00–0.07)
Basophils Absolute: 0.1 10*3/uL (ref 0.0–0.1)
Basophils Relative: 1 %
Eosinophils Absolute: 0.3 10*3/uL (ref 0.0–0.5)
Eosinophils Relative: 4 %
HCT: 38.8 % (ref 36.0–46.0)
Hemoglobin: 13.6 g/dL (ref 12.0–15.0)
Immature Granulocytes: 0 %
Lymphocytes Relative: 22 %
Lymphs Abs: 2 10*3/uL (ref 0.7–4.0)
MCH: 31.6 pg (ref 26.0–34.0)
MCHC: 35.1 g/dL (ref 30.0–36.0)
MCV: 90 fL (ref 80.0–100.0)
Monocytes Absolute: 0.9 10*3/uL (ref 0.1–1.0)
Monocytes Relative: 10 %
Neutro Abs: 5.9 10*3/uL (ref 1.7–7.7)
Neutrophils Relative %: 63 %
Platelets: 272 10*3/uL (ref 150–400)
RBC: 4.31 MIL/uL (ref 3.87–5.11)
RDW: 12.3 % (ref 11.5–15.5)
WBC: 9.2 10*3/uL (ref 4.0–10.5)
nRBC: 0 % (ref 0.0–0.2)

## 2022-05-15 LAB — POC URINE PREG, ED: Preg Test, Ur: POSITIVE — AB

## 2022-05-15 LAB — SARS CORONAVIRUS 2 BY RT PCR: SARS Coronavirus 2 by RT PCR: NEGATIVE

## 2022-05-15 NOTE — Discharge Instructions (Addendum)
The pregnancy test is positive. You are about [redacted] weeks pregnant today, with an estimated due date of 12/31/2022( la prueba de [place] es positiva, y tiene aproximamente 7 semanas de Psychiatrist)  Since you are having abdominal pain you need to present to the maternal admissions unit for further evaluation and testing(Ud debe ir a la maternal admissions unit para mas evaluacion )   Take the prednisone 10 mg-- 4 tablets daily for 5 days, then go back to taking 1 daily as you are prescribed originally  (tome la prednisone 10 mg 4 tabletas diaria por 5 dias, y luego 1 diaria como antes.)

## 2022-05-15 NOTE — MAU Provider Note (Signed)
Event Date/Time   First Provider Initiated Contact with Patient 05/15/22 2140      S [name] is a 36 y.o. [phone] patient who presents to MAU today with complaint of abdominal pain.  She was seen earlier, but could not complete her [place] d/t other obligations.  She has returned and states the pain remains.  She describes it as constant pain "that is bothering me."  She rates the pain a 5/10.     O BP 130/76 (BP Location: Right Arm)   Pulse (!) 108   Temp 98.7 F (37.1 C) (Oral)   Resp 20   Wt 114.8 kg   LMP 03/30/2022 (Exact Date)   SpO2 100%   BMI 38.47 kg/m  Physical Exam Vitals reviewed. Exam conducted with a chaperone present.  Constitutional:      Appearance: She is well-developed. She is obese.  HENT:     Head: Normocephalic and atraumatic.  Eyes:     Conjunctiva/sclera: Conjunctivae normal.  Cardiovascular:     Rate and Rhythm: Normal rate.  Pulmonary:     Effort: Pulmonary effort is normal. No respiratory distress.  Neurological:     Mental Status: She is alert and oriented to person, place, and time.  Psychiatric:        Mood and Affect: Mood normal.        Thought Content: Thought content normal.    US OB LESS THAN 14 WEEKS WITH OB TRANSVAGINAL  Result Date: 05/15/2022 CLINICAL DATA:  Pelvic pain for 1 day, positive pregnancy test EXAM: OBSTETRIC <14 WK [place] AND TRANSVAGINAL OB US TECHNIQUE: Both transabdominal and transvaginal ultrasound examinations were performed for complete evaluation of the gestation as well as the maternal uterus, adnexal regions, and pelvic cul-de-sac. Transvaginal technique was performed to assess early pregnancy. COMPARISON:  None Available. FINDINGS: Intrauterine gestational sac: Present Yolk sac:  Absent Embryo:  Absent MSD: 8 mm   5 w   4 d Subchorionic hemorrhage:  None visualized. Maternal uterus/adnexae: Ovaries are within normal limits. No free pelvic fluid is noted. IMPRESSION: Probable early intrauterine gestational  sac, but no yolk sac, fetal pole, or cardiac activity yet visualized. Recommend follow-up quantitative B-HCG levels and follow-up US in 14 days to assess viability. This recommendation follows SRU consensus guidelines: Diagnostic Criteria for Nonviable Pregnancy Early in the First Trimester. Malva Limes Med 2013; 917:9150-56. Electronically Signed   By: Alcide Clever M.D.   On: 05/15/2022 22:07    A Abdominal Pain F/U Ultrasound  P UItrasound ordered. Patient offered and declines pain medication Await results.   Gerrit Heck, CNM 05/15/2022 9:40 PM   Reassessment (10:22 PM) Results as above Discussed need for f/u US in 7-10 days. Order placed for [facility] considering HR factors. Patient requested and given provider list. Precautions reviewed. Discharge from MAU in stable condition Patient may return to MAU as needed   Cherre Robins MSN, CNM Advanced Practice Provider, [facility]

## 2022-05-15 NOTE — ED Triage Notes (Signed)
Pt states that she has had cough, congestion, fever and headache X 3 days and has been taking tylenol without some relief.  She has been using her MDI   She also states that she is 16 days late for her menstrual cycle.

## 2022-05-15 NOTE — MAU Note (Signed)
Pt says she went to [facility] at 1 pm  Then came here  at 4pm- labs , Cultures, had cough - Had an U/S  ordered - but left AMA- bc she had to pick up kids .  Some lower abd pain No VB

## 2022-05-15 NOTE — MAU Note (Signed)
..  Monica Peters is a 36 y.o. at Unknown here in MAU reporting: cough, fever, sore throat, stuffy nose, and headache for about 4 days. She went to the ED today also reporting of lower abdominal pain and white thin discharge. +pregnancy test in ED. Taking Tylenol for fever.  LMP: 03/30/2022 Onset of complaint: 05/12/2023 Pain score: 4/10 Vitals:   05/15/22 1606 05/15/22 1607  BP: (!) 147/73   Pulse: (!) 111   Resp: 18   Temp: 98.8 F (37.1 C)   SpO2: 100% 100%   Lab orders placed from triage:   UA

## 2022-05-15 NOTE — MAU Provider Note (Signed)
History     CSN: [id number]  Arrival date and time: 05/15/22 1531   Event Date/Time   First Provider Initiated Contact with Patient 05/15/22 1623      Chief Complaint  Patient presents with   Abdominal Pain   Cough   Vaginal Discharge   HPI Coreena Rubalcava Iran Ouch  is a 36 y.o. [id number] at [redacted]w[redacted]d by LMP who presents to MAU from Urgent Care for evaluation of abdominal pain, sinus congestion and fever. She denies vaginal bleeding, dysuria, vomiting, and fever.  Abdominal Pain This is a new problem, onset yesterday. Patient's pain is suprapubic and does not radiate. Pain score is 4/10. She has not taken medication for this complaint. She denies aggravating or alleviating factors. She denies abdominal tenderness.  Sinus congestion and fever This is a recurrent problem, onset about four days ago. Patient's daughter is also sick and tested negative for COVID and flu. Patient states her fever broke overnight and she has been afebrile today. She denies SOB, chest pain, weakness, syncope.  1630: During CNM's initial assessment patient states she needs to leave soon as her dad must pick up medication and she needs to pick up her children.  OB History     Gravida  6   Para  3   Term  2   Preterm  1   AB  2   Living  3      SAB  2   IAB  0   Ectopic  0   Multiple  0   Live Births  3           No past medical history on file.  Past Surgical History:  Procedure Laterality Date   CHOLECYSTECTOMY  2021    No family history on file.  Social History   Tobacco Use   Smoking status: Never   Smokeless tobacco: Never  Vaping Use   Vaping Use: Never used  Substance Use Topics   Alcohol use: Never   Drug use: Never    Allergies: No Known Allergies  Medications Prior to Admission  Medication Sig Dispense Refill Last Dose   acetaminophen (TYLENOL) 325 MG tablet Take 650 mg by mouth every 6 (six) hours as needed for mild pain.   05/15/2022   Cholecalciferol  (VITAMIN D3) 50 MCG (2000 UT) capsule Take 1 capsule (2,000 Units total) by mouth daily. 90 capsule 1 Past Week   Norgestimate-Ethinyl Estradiol Triphasic 0.18/0.215/0.25 MG-35 MCG tablet TAKE 1 TABLET BY MOUTH DAILY. 28 tablet 11 Past Week   albuterol (VENTOLIN HFA) 108 (90 Base) MCG/ACT inhaler Inhale 2 puffs into the lungs every 6 (six) hours as needed for wheezing or shortness of breath. 8 g 0 More than a month   omeprazole (PRILOSEC) 20 MG capsule Take 1 capsule (20 mg total) by mouth daily. 30 capsule 3 More than a month    Review of Systems  HENT:  Positive for congestion.   Gastrointestinal:  Positive for abdominal pain.  All other systems reviewed and are negative.  Physical Exam   Blood pressure (!) 147/73, pulse (!) 111, temperature 98.8 F (37.1 C), temperature source Oral, resp. rate 18, height 5\' 8"  (1.727 m), weight 117 kg, last menstrual period 03/30/2022, SpO2 100 %.  Physical Exam Vitals and nursing note reviewed. Exam conducted with a chaperone present.  Constitutional:      General: She is not in acute distress.    Appearance: She is well-developed. She is obese. She is not ill-appearing.  Cardiovascular:     Rate and Rhythm: Normal rate and regular rhythm.     Heart sounds: Normal heart sounds.  Pulmonary:     Effort: Pulmonary effort is normal.     Breath sounds: Normal breath sounds.  Abdominal:     Palpations: Abdomen is soft.     Tenderness: There is no abdominal tenderness. There is no right CVA tenderness or left CVA tenderness.  Skin:    Capillary Refill: Capillary refill takes less than 2 seconds.  Neurological:     Mental Status: She is alert and oriented to person, place, and time.  Psychiatric:        Mood and Affect: Mood normal.        Behavior: Behavior normal.     MAU Course  Procedures  MDM Orders Placed This Encounter  Procedures   Wet prep, genital   US OB LESS THAN 14 WEEKS WITH OB TRANSVAGINAL   hCG, quantitative, pregnancy    CBC with Differential/Platelet   Basic metabolic panel   Airborne and Contact precautions   Patient Vitals for the past 24 hrs:  BP Temp Temp src Pulse Resp SpO2 Height Weight  05/15/22 1607 -- -- -- -- -- 100 % -- --  05/15/22 1606 (!) 147/73 98.8 F (37.1 C) Oral (!) 111 18 100 % 5\' 8"  (1.727 m) 117 kg   Results for orders placed or performed during the hospital encounter of 05/15/22 (from the past 24 hour(s))  CBC with Differential/Platelet     Status: None   Collection Time: 05/15/22  4:32 PM  Result Value Ref Range   WBC 9.2 4.0 - 10.5 K/uL   RBC 4.31 3.87 - 5.11 MIL/uL   Hemoglobin 13.6 12.0 - 15.0 g/dL   HCT 05/17/22 93.2 - 35.5 %   MCV 90.0 80.0 - 100.0 fL   MCH 31.6 26.0 - 34.0 pg   MCHC 35.1 30.0 - 36.0 g/dL   RDW 73.2 20.2 - 54.2 %   Platelets 272 150 - 400 K/uL   nRBC 0.0 0.0 - 0.2 %   Neutrophils Relative % 63 %   Neutro Abs 5.9 1.7 - 7.7 K/uL   Lymphocytes Relative 22 %   Lymphs Abs 2.0 0.7 - 4.0 K/uL   Monocytes Relative 10 %   Monocytes Absolute 0.9 0.1 - 1.0 K/uL   Eosinophils Relative 4 %   Eosinophils Absolute 0.3 0.0 - 0.5 K/uL   Basophils Relative 1 %   Basophils Absolute 0.1 0.0 - 0.1 K/uL   Immature Granulocytes 0 %   Abs Immature Granulocytes 0.04 0.00 - 0.07 K/uL  Wet prep, genital     Status: Abnormal   Collection Time: 05/15/22  4:43 PM   Specimen: Cervix  Result Value Ref Range   Yeast Wet Prep HPF POC NONE SEEN NONE SEEN   Trich, Wet Prep NONE SEEN NONE SEEN   Clue Cells Wet Prep HPF POC NONE SEEN NONE SEEN   WBC, Wet Prep HPF POC >=10 (A) <10   Sperm NONE SEEN    Assessment and Plan  --Keerthi Hazell is a 36 y.o. 20 at [redacted]w[redacted]d by LMP --BMET hemolyzed in lab and was unable to be processed --COVID swab in process (collected by Urgent Care) --Patient left AMA prior to ultrasound but plans to return this evening for ultrasound  [redacted]w[redacted]d, MSA, MSN, CNM 05/15/2022, 6:17 PM

## 2022-05-15 NOTE — ED Notes (Signed)
Patient is being discharged from the Urgent Care and sent to the Emergency Department via POV . Per Loreta Ave MD, patient is in need of higher level of care due to lower abdominal pain and positive pregnancy test. Patient is aware and verbalizes understanding of plan of care.  Vitals:   05/15/22 1423  BP: 130/83  Pulse: (!) 103  Resp: 18  Temp: 98.5 F (36.9 C)  SpO2: 98%

## 2022-05-15 NOTE — MAU Note (Signed)
Pt is requesting to leave. She states she has to make it to pick up her fathers medications at the community pharmacy by 6 and pick up her children. She informed this nurse that she will come back later tonight or tomorrow. She signed AMA paper.

## 2022-05-15 NOTE — Discharge Instructions (Signed)
  [facility] Providers          [facility]  [address], [place], [place] [address]  [phone]  [facility]  [address], [place], [place] [address]  [phone]  [facility]  [address], [place], [place] [address]  [phone]  [facility] [place]  [address], [place], [place] [address]  [phone]  [facility]  [address]First floor), [place], [place] [address]  [phone]  [facility]  [address], [place], [place] [address]  [phone]  [facility]  [address], [place], [place] [address]  [phone]  [facility]  [address], [place], [place] [address]  [phone]  [facility]  [address], [place], [place] [address]  [phone]  [facility]  [address], [place], [place] [address]  [phone]  [facility]  [address], [place], [place] [address]  [phone]  [facility]   [address], [place], [place] [address]  [phone]  [facility]  [address], [place], [place] [address]   [phone]  [facility]  [address], [place],  [address]  [phone]         

## 2022-05-15 NOTE — ED Provider Notes (Signed)
[facility]    CSN: [id number] Arrival date & time: 05/15/22  1300      History   Chief Complaint Chief Complaint  Patient presents with   Cough   Nasal Congestion   Fever    HPI Monica Peters is a 36 y.o. female.    Cough Associated symptoms: fever   Fever Associated symptoms: cough    Here with a history of cough and congestion that began on September 12.  She has had minimal sore throat.  No nausea, vomiting, or diarrhea.  Maybe had some subjective fever and chills.  She has asthma and has been wheezing more than usual.  Also her last menstrual cycle was about 6 weeks ago.  She is just recently moved here from [place]  She also has some redness and had some discharge from her left eye.    History reviewed. No pertinent past medical history.  Patient Active Problem List   Diagnosis Date Noted   Abnormal uterine bleeding (AUB) 09/19/2020   Viral upper respiratory tract infection 08/11/2020   Nausea 08/11/2020   Dyspnea 05/14/2020    Past Surgical History:  Procedure Laterality Date   CHOLECYSTECTOMY  2021    OB History     Gravida  5   Para  3   Term  2   Preterm  1   AB  2   Living  3      SAB  2   IAB  0   Ectopic  0   Multiple  0   Live Births  3            Home Medications    Prior to Admission medications   Medication Sig Start Date End Date Taking? Authorizing Provider  albuterol (VENTOLIN HFA) 108 (90 Base) MCG/ACT inhaler Inhale 2 puffs into the lungs every 6 (six) hours as needed for wheezing or shortness of breath. 04/12/20  Yes Grayce Sessions, NP  Norgestimate-Ethinyl Estradiol Triphasic 0.18/0.215/0.25 MG-35 MCG tablet TAKE 1 TABLET BY MOUTH DAILY. 11/07/20 05/15/22 Yes Adam Phenix, MD  Cholecalciferol (VITAMIN D3) 50 MCG (2000 UT) capsule Take 1 capsule (2,000 Units total) by mouth daily. 02/18/22   Grayce Sessions, NP  omeprazole (PRILOSEC) 20 MG capsule Take 1 capsule (20 mg total)  by mouth daily. 01/22/22   Mayers, Cari S, PA-C  levonorgestrel-ethinyl estradiol (NORDETTE) 0.15-30 MG-MCG tablet Take 1 tablet by mouth daily. Patient not taking: Reported on 11/07/2020 06/05/20 11/07/20  Grayce Sessions, NP    Family History History reviewed. No pertinent family history.  Social History Social History   Tobacco Use   Smoking status: Never   Smokeless tobacco: Never  Vaping Use   Vaping Use: Never used  Substance Use Topics   Alcohol use: Never   Drug use: Never     Allergies   Patient has no known allergies.   Review of Systems Review of Systems  Constitutional:  Positive for fever.  Respiratory:  Positive for cough.      Physical Exam Triage Vital Signs ED Triage Vitals  Enc Vitals Group     BP 05/15/22 1423 130/83     Pulse Rate 05/15/22 1423 (!) 103     Resp 05/15/22 1423 18     Temp 05/15/22 1423 98.5 F (36.9 C)     Temp Source 05/15/22 1423 Oral     SpO2 05/15/22 1423 98 %     Weight --  Height --      Head Circumference --      Peak Flow --      Pain Score 05/15/22 1420 4     Pain Loc --      Pain Edu? --      Excl. in GC? --    No data found.  Updated Vital Signs BP 130/83 (BP Location: Right Arm)   Pulse (!) 103   Temp 98.5 F (36.9 C) (Oral)   Resp 18   LMP 03/26/2022 (Exact Date)   SpO2 98%   Visual Acuity Right Eye Distance:   Left Eye Distance:   Bilateral Distance:    Right Eye Near:   Left Eye Near:    Bilateral Near:     Physical Exam Vitals reviewed.  Constitutional:      General: She is not in acute distress.    Appearance: She is not ill-appearing, toxic-appearing or diaphoretic.  HENT:     Nose: Nose normal.     Mouth/Throat:     Mouth: Mucous membranes are moist.     Pharynx: No oropharyngeal exudate or posterior oropharyngeal erythema.  Eyes:     Extraocular Movements: Extraocular movements intact.     Pupils: Pupils are equal, round, and reactive to light.     Comments: There is mild  injection of the left conjunctiva.  The lids are not swollen or erythematous  Cardiovascular:     Rate and Rhythm: Normal rate and regular rhythm.     Heart sounds: No murmur heard. Pulmonary:     Effort: Pulmonary effort is normal. No respiratory distress.     Breath sounds: No stridor. No wheezing, rhonchi or rales.  Skin:    Coloration: Skin is not jaundiced or pale.  Neurological:     General: No focal deficit present.     Mental Status: She is alert and oriented to person, place, and time.  Psychiatric:        Behavior: Behavior normal.      UC Treatments / Results  Labs (all labs ordered are listed, but only abnormal results are displayed) Labs Reviewed  POC URINE PREG, ED - Abnormal; Notable for the following components:      Result Value   Preg Test, Ur POSITIVE (*)    All other components within normal limits  SARS CORONAVIRUS 2 BY RT PCR    EKG   Radiology No results found.  Procedures Procedures (including critical care time)  Medications Ordered in UC Medications - No data to display  Initial Impression / Assessment and Plan / UC Course  I have reviewed the triage vital signs and the nursing notes.  Pertinent labs & imaging results that were available during my care of the patient were reviewed by me and considered in my medical decision making (see chart for details).     UPT is positive.  I was going to treat her for an asthma exacerbation.  She is on a 30-day course of prednisone however.  I am going to ask her to take 4 of those tablets daily for 5 days, and then stop, then return to her once daily dosing  She is swabbed for COVID.  She is given contact information for GYN      Final Clinical Impressions(s) / UC Diagnoses   Final diagnoses:  Viral URI with cough  Mild intermittent asthma with acute exacerbation  Pregnancy test positive     Discharge Instructions      The pregnancy  test is positive. You are about [redacted] weeks pregnant  today, with an estimated due date of 12/31/2022( la prueba de [place] es positiva, y tiene aproximamente 7 semanas de Psychiatrist)  Since you are having abdominal pain you need to present to the maternal admissions unit for further evaluation and testing(Ud debe ir a la maternal admissions unit para mas evaluacion )   Take the prednisone 10 mg-- 4 tablets daily for 5 days, then go back to taking 1 daily as you are prescribed originally  (tome la prednisone 10 mg 4 tabletas diaria por 5 dias, y luego 1 diaria como antes.)     ED Prescriptions   None    PDMP not reviewed this encounter.   Zenia Resides, MD 05/15/22 [phone]

## 2022-05-18 LAB — GC/CHLAMYDIA PROBE AMP (~~LOC~~) NOT AT ARMC
Chlamydia: NEGATIVE
Comment: NEGATIVE
Comment: NORMAL
Neisseria Gonorrhea: NEGATIVE

## 2022-05-20 ENCOUNTER — Encounter (HOSPITAL_COMMUNITY): Payer: Self-pay | Admitting: Family Medicine

## 2022-05-20 ENCOUNTER — Inpatient Hospital Stay (HOSPITAL_COMMUNITY)
Admission: AD | Admit: 2022-05-20 | Discharge: 2022-05-20 | Disposition: A | Discharge status: Home / Self Care | Payer: Self-pay | Attending: Family Medicine | Admitting: Family Medicine

## 2022-05-20 ENCOUNTER — Inpatient Hospital Stay (HOSPITAL_COMMUNITY): Payer: Self-pay

## 2022-05-20 DIAGNOSIS — O09521 Supervision of elderly multigravida, first trimester: Secondary | ICD-10-CM | POA: Insufficient documentation

## 2022-05-20 DIAGNOSIS — R109 Unspecified abdominal pain: Secondary | ICD-10-CM

## 2022-05-20 DIAGNOSIS — Z3A01 Less than 8 weeks gestation of pregnancy: Secondary | ICD-10-CM

## 2022-05-20 DIAGNOSIS — O209 Hemorrhage in early pregnancy, unspecified: Secondary | ICD-10-CM

## 2022-05-20 DIAGNOSIS — R102 Pelvic and perineal pain: Secondary | ICD-10-CM | POA: Insufficient documentation

## 2022-05-20 DIAGNOSIS — O26899 Other specified pregnancy related conditions, unspecified trimester: Secondary | ICD-10-CM

## 2022-05-20 HISTORY — DX: Other specified health status: Z78.9

## 2022-05-20 LAB — URINALYSIS, ROUTINE W REFLEX MICROSCOPIC
Bilirubin Urine: NEGATIVE
Glucose, UA: NEGATIVE mg/dL
Ketones, ur: NEGATIVE mg/dL
Nitrite: NEGATIVE
Protein, ur: NEGATIVE mg/dL
Specific Gravity, Urine: 1.005 (ref 1.005–1.030)
pH: 8 (ref 5.0–8.0)

## 2022-05-20 LAB — HCG, QUANTITATIVE, PREGNANCY: hCG, Beta Chain, Quant, S: 20378 m[IU]/mL — ABNORMAL HIGH (ref <5)

## 2022-05-20 NOTE — MAU Note (Signed)
.  Monica Peters is a 36 y.o. at [redacted]w[redacted]d here in MAU reporting: pain in her lower abd today, also noted some pink discharge when she wipes. Pressure in her lower abd also.  Onset of complaint: today Pain score: 4/10 Vitals:   05/20/22 2138  BP: 137/85  Pulse: (!) 101  Resp: 18  Temp: 98.3 F (36.8 C)  SpO2: 100%     FHT:na Lab orders placed from triage: urine

## 2022-05-20 NOTE — MAU Provider Note (Signed)
Chief Complaint: No chief complaint on file.   Event Date/Time   First Provider Initiated Contact with Patient 05/20/22 2143        SUBJECTIVE HPI: Reshonda Koerber is a 36 y.o. [id number] at [redacted]w[redacted]d by LMP who presents to maternity admissions reporting pelvic cramping and pink discharge.  Has been evaluated for this earlier this week with an inconclusive Ultrasound.   She states she wants to know if she is losing the baby. . She denies urinary symptoms, h/a, dizziness, n/v, or fever/chills.    Abdominal Pain This is a recurrent problem. The onset quality is gradual. The problem occurs intermittently. The quality of the pain is cramping. Pertinent negatives include no constipation, diarrhea, dysuria or fever. Nothing aggravates the pain. The pain is relieved by Nothing.  Vaginal Bleeding The patient's primary symptoms include pelvic pain. The patient's pertinent negatives include no genital itching or genital odor. This is a recurrent problem. The problem has been unchanged. The pain is mild. She is pregnant. Associated symptoms include abdominal pain. Pertinent negatives include no constipation, diarrhea, dysuria or fever. Nothing aggravates the symptoms. She has tried nothing for the symptoms.   RN Note: Venicia Vandall is a 36 y.o. at [redacted]w[redacted]d here in MAU reporting: pain in her lower abd today, also noted some pink discharge when she wipes. Pressure in her lower abd also.  Onset of complaint: today Pain score: 4/10  No past medical history on file. Past Surgical History:  Procedure Laterality Date   CHOLECYSTECTOMY  2021   Social History   Socioeconomic History   Marital status: Single    Spouse name: Not on file   Number of children: Not on file   Years of education: Not on file   Highest education level: Not on file  Occupational History   Not on file  Tobacco Use   Smoking status: Never   Smokeless tobacco: Never  Vaping Use   Vaping Use: Never used  Substance and  Sexual Activity   Alcohol use: Never   Drug use: Never   Sexual activity: Yes    Birth control/protection: Pill  Other Topics Concern   Not on file  Social History Narrative   Not on file   Social Determinants of Health   Financial Resource Strain: Not on file  Food Insecurity: Food Insecurity Present (11/07/2020)   Hunger Vital Sign    Worried About Running Out of Food in the Last Year: Sometimes true    Ran Out of Food in the Last Year: Sometimes true  Transportation Needs: Unmet Transportation Needs (11/07/2020)   PRAPARE - Hydrologist (Medical): Yes    Lack of Transportation (Non-Medical): Yes  Physical Activity: Not on file  Stress: Not on file  Social Connections: Not on file  Intimate Partner Violence: Not on file   No current facility-administered medications on file prior to encounter.   Current Outpatient Medications on File Prior to Encounter  Medication Sig Dispense Refill   acetaminophen (TYLENOL) 325 MG tablet Take 650 mg by mouth every 6 (six) hours as needed for mild pain.     albuterol (VENTOLIN HFA) 108 (90 Base) MCG/ACT inhaler Inhale 2 puffs into the lungs every 6 (six) hours as needed for wheezing or shortness of breath. 8 g 0   Cholecalciferol (VITAMIN D3) 50 MCG (2000 UT) capsule Take 1 capsule (2,000 Units total) by mouth daily. 90 capsule 1   omeprazole (PRILOSEC) 20 MG capsule Take 1 capsule (  20 mg total) by mouth daily. 30 capsule 3   [DISCONTINUED] levonorgestrel-ethinyl estradiol (NORDETTE) 0.15-30 MG-MCG tablet Take 1 tablet by mouth daily. (Patient not taking: Reported on 11/07/2020) 28 tablet 11   No Known Allergies  I have reviewed patient's Past Medical Hx, Surgical Hx, Family Hx, Social Hx, medications and allergies.   ROS:  Review of Systems  Constitutional:  Negative for fever.  Gastrointestinal:  Positive for abdominal pain. Negative for constipation and diarrhea.  Genitourinary:  Positive for pelvic pain and  vaginal bleeding. Negative for dysuria.   Review of Systems  Other systems negative   Physical Exam  Physical Exam Patient Vitals for the past 24 hrs:  BP Temp Pulse Resp SpO2 Height Weight  05/20/22 2138 137/85 98.3 F (36.8 C) (!) 101 18 100 % 5' 6.5" (1.689 m) 113.8 kg   Constitutional: Well-developed, well-nourished female in no acute distress.  Cardiovascular: normal rate Respiratory: normal effort GI: Abd soft, non-tender. Pos BS x 4 MS: Extremities nontender, no edema, normal ROM Neurologic: Alert and oriented x 4.  GU: Neg CVAT.  PELVIC EXAM: deferred in lieu of transvaginal ultrasound  LAB RESULTS Blood Type O+    IMAGING US OB Transvaginal  Result Date: 05/20/2022 CLINICAL DATA:  Pelvic pain, positive pregnancy test EXAM: TRANSVAGINAL OB ULTRASOUND TECHNIQUE: Transvaginal ultrasound was performed for complete evaluation of the gestation as well as the maternal uterus, adnexal regions, and pelvic cul-de-sac. COMPARISON:  05/15/2022 FINDINGS: Intrauterine gestational sac: Present Yolk sac:  Present Embryo:  Present Cardiac Activity: Present Heart Rate: 115 bpm CRL:   2.2 mm   5 w 5 d                  [place] EDC: 01/15/2023 Subchorionic hemorrhage:  None visualized. Maternal uterus/adnexae: Left ovary is not well seen. Right ovary is within normal limits. IMPRESSION: Single live intrauterine gestation at 5 weeks 5 days. Electronically Signed   By: Alcide Clever M.D.   On: 05/20/2022 22:49   US OB LESS THAN 14 WEEKS WITH OB TRANSVAGINAL  Result Date: 05/15/2022 CLINICAL DATA:  Pelvic pain for 1 day, positive pregnancy test EXAM: OBSTETRIC <14 WK [place] AND TRANSVAGINAL OB US TECHNIQUE: Both transabdominal and transvaginal ultrasound examinations were performed for complete evaluation of the gestation as well as the maternal uterus, adnexal regions, and pelvic cul-de-sac. Transvaginal technique was performed to assess early pregnancy. COMPARISON:  None Available. FINDINGS: Intrauterine  gestational sac: Present Yolk sac:  Absent Embryo:  Absent MSD: 8 mm   5 w   4 d Subchorionic hemorrhage:  None visualized. Maternal uterus/adnexae: Ovaries are within normal limits. No free pelvic fluid is noted. IMPRESSION: Probable early intrauterine gestational sac, but no yolk sac, fetal pole, or cardiac activity yet visualized. Recommend follow-up quantitative B-HCG levels and follow-up US in 14 days to assess viability. This recommendation follows SRU consensus guidelines: Diagnostic Criteria for Nonviable Pregnancy Early in the First Trimester. Malva Limes Med 2013; 932:6712-45. Electronically Signed   By: Alcide Clever M.D.   On: 05/15/2022 22:07    MAU Management/MDM: I have reviewed the triage vital signs and the nursing notes.   Pertinent labs & imaging results that were available during my care of the patient were reviewed by me and considered in my medical decision making (see chart for details).      I have reviewed her medical records including past results, notes and treatments.   Ordered Ultrasound to rule out SAB. Discussed with patient that  we may not see anything different than the [place] from 5 days ago.   Consult Dr Shawnie Pons with presentation, exam findings, and results.     This bleeding/pain can represent a normal pregnancy with bleeding, spontaneous abortion or even an ectopic which can be life-threatening.  The process as listed above helps to determine which of these is present.  US showed single live fetus with heartbeat, patient is relieved  ASSESSMENT Single IUP at [redacted]w[redacted]d, live fetus Light bleeding, unclear if vaginal or hemorrhoid (documented prior) Pelvic cramping  PLAN Discharge home Pelvic rest  Pt stable at time of discharge. Encouraged to return here if she develops worsening of symptoms, increase in pain, fever, or other concerning symptoms.    Wynelle Bourgeois CNM, MSN Certified Nurse-Midwife 05/20/2022  9:43 PM

## 2022-05-27 ENCOUNTER — Other Ambulatory Visit: Payer: Self-pay

## 2022-05-28 ENCOUNTER — Ambulatory Visit (INDEPENDENT_AMBULATORY_CARE_PROVIDER_SITE_OTHER): Payer: Self-pay | Admitting: Orthopedic Surgery

## 2022-05-28 DIAGNOSIS — M76822 Posterior tibial tendinitis, left leg: Secondary | ICD-10-CM

## 2022-05-28 DIAGNOSIS — M722 Plantar fascial fibromatosis: Secondary | ICD-10-CM

## 2022-06-01 ENCOUNTER — Ambulatory Visit
Admission: RE | Admit: 2022-06-01 | Discharge: 2022-06-01 | Disposition: A | Discharge status: Home / Self Care | Payer: Self-pay | Source: Ambulatory Visit

## 2022-06-01 DIAGNOSIS — Z3490 Encounter for supervision of normal pregnancy, unspecified, unspecified trimester: Secondary | ICD-10-CM | POA: Insufficient documentation

## 2022-06-01 DIAGNOSIS — Z3A01 Less than 8 weeks gestation of pregnancy: Secondary | ICD-10-CM | POA: Insufficient documentation

## 2022-06-01 DIAGNOSIS — Z3689 Encounter for other specified antenatal screening: Secondary | ICD-10-CM | POA: Insufficient documentation

## 2022-06-09 ENCOUNTER — Encounter: Payer: Self-pay | Admitting: Orthopedic Surgery

## 2022-06-09 NOTE — Progress Notes (Signed)
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  She has been using Peters ASO in a fracture boot.  She stopped the prednisone secondary to pregnancy.  She is wearing the ASO today and states that her foot is feeling better.  Assessment & Plan: Visit Diagnoses:  1. Posterior tibial tendinitis, left leg     Plan: Discussed continuing with the ASO immobilization using a stiff soled sneaker for the planter fasciitis.  Discussed the possibility of physical therapy to strengthen the ankle.  Follow-Up Instructions: Return in about 3 months (around 08/27/2022).   Ortho Exam  Patient is alert, oriented, no adenopathy, well-dressed, normal affect, normal respiratory effort. Examination patient has swelling and tenderness to palpation over the posterior tibial tendon resisted inversion is painful.  She does have pain to palpation over the sinus Tarsi with lateral impingement.  She does have tenderness to palpation the origin of the plantar fascia on the right heel.  Imaging: No results found. No images are attached to the encounter.  Labs: No results found for: "HGBA1C", "ESRSEDRATE", "CRP", "LABURIC", "REPTSTATUS", "GRAMSTAIN", "CULT", "LABORGA"   Lab Results  Component Value Date   ALBUMIN 4.7 05/08/2021   ALBUMIN 4.6 09/10/2020    No results found for: "MG" Lab Results  Component Value Date   VD25OH 17.9 (L) 02/17/2022    No results found for: "PREALBUMIN"    Latest Ref Rng & Units 05/15/2022    4:32 PM 02/17/2022    9:54 AM 09/10/2020   10:21 AM  CBC EXTENDED  WBC 4.0 - 10.5  K/uL 9.2  5.8  5.1   RBC 3.87 - 5.11 MIL/uL 4.31  4.94  4.51   Hemoglobin 12.0 - 15.0 g/dL 13.6  15.2  13.8   HCT 36.0 - 46.0 % 38.8  45.4  40.5   Platelets 150 - 400 K/uL 272  204  291   NEUT# 1.7 - 7.7 K/uL 5.9  2.8  2.2   Lymph# 0.7 - 4.0 K/uL 2.0  2.4  2.3      There is no height or weight on file to calculate BMI.  Orders:  No orders of the defined types were placed in this encounter.  No orders of the defined types were placed in this encounter.    Procedures: No procedures performed  Clinical Data: No additional findings.  ROS:  All other systems negative, except as noted in the HPI. Review of Systems  Objective: Vital Signs: LMP 03/30/2022 (Exact Date)   Specialty Comments:  No specialty comments available.  PMFS History: Patient Active Problem List   Diagnosis Date Noted   Abnormal uterine bleeding (AUB) 09/19/2020   Viral upper respiratory tract infection 08/11/2020   Nausea 08/11/2020   Dyspnea 05/14/2020   Past Medical History:  Diagnosis Date   Medical history non-contributory     History reviewed. No pertinent family history.  Past Surgical History:  Procedure Laterality Date   CHOLECYSTECTOMY  2021   Social History   Occupational History   Not on file  Tobacco Use   Smoking status: Never   Smokeless tobacco: Never  Vaping Use   Vaping Use: Never used  Substance and Sexual Activity   Alcohol use: Never   Drug use: Never   Sexual activity: Yes

## 2022-07-01 ENCOUNTER — Ambulatory Visit (INDEPENDENT_AMBULATORY_CARE_PROVIDER_SITE_OTHER): Payer: Self-pay | Admitting: Primary Care

## 2022-07-01 ENCOUNTER — Encounter (INDEPENDENT_AMBULATORY_CARE_PROVIDER_SITE_OTHER): Payer: Self-pay | Admitting: Primary Care

## 2022-07-01 VITALS — BP 119/74 | HR 88 | Resp 16 | Wt 247.2 lb

## 2022-07-01 DIAGNOSIS — Z3490 Encounter for supervision of normal pregnancy, unspecified, unspecified trimester: Secondary | ICD-10-CM

## 2022-07-01 NOTE — Progress Notes (Signed)
  [place]  Naarah Borgerding Su Grand, is a 36 y.o. female  [id number]  [id number]  DOB - [birth year]  Chief Complaint  Patient presents with   Depression    Follow up        Subjective:   Adrean Findlay is a 36 y.o. female here today for a follow up visit for depression. She is pregnant and GYN informed her to stop all her medications. She has an appt with OBGYN tomorrow  No Known Allergies  Past Medical History:  Diagnosis Date   Medical history non-contributory     Current Outpatient Medications on File Prior to Visit  Medication Sig Dispense Refill   acetaminophen (TYLENOL) 325 MG tablet Take 650 mg by mouth every 6 (six) hours as needed for mild pain.     albuterol (VENTOLIN HFA) 108 (90 Base) MCG/ACT inhaler Inhale 2 puffs into the lungs every 6 (six) hours as needed for wheezing or shortness of breath. 8 g 0   Cholecalciferol (VITAMIN D3) 50 MCG (2000 UT) capsule Take 1 capsule (2,000 Units total) by mouth daily. 90 capsule 1   omeprazole (PRILOSEC) 20 MG capsule Take 1 capsule (20 mg total) by mouth daily. 30 capsule 3   Prenatal Vit-Fe Fumarate-FA (MULTIVITAMIN-PRENATAL) 27-0.8 MG TABS tablet Take 1 tablet by mouth daily at 12 noon.     [DISCONTINUED] levonorgestrel-ethinyl estradiol (NORDETTE) 0.15-30 MG-MCG tablet Take 1 tablet by mouth daily. (Patient not taking: Reported on 11/07/2020) 28 tablet 11   No current facility-administered medications on file prior to visit.    Objective:   Vitals:   07/01/22 0851  BP: 119/74  Pulse: 88  Resp: 16  SpO2: 97%  Weight: 247 lb 3.2 oz (112.1 kg)    Exam General appearance : Awake, alert, not in any distress. Speech Clear. Not toxic looking HEENT: Atraumatic and Normocephalic, pupils equally reactive to light and accomodation Neck: Supple, no JVD. No cervical lymphadenopathy.  Chest: Good air entry bilaterally, no added sounds  CVS: S1 S2 regular, no murmurs.  Abdomen: Bowel sounds  present, Non tender and not distended with no gaurding, rigidity or rebound. Extremities: B/L Lower Ext shows no edema, both legs are warm to touch Neurology: Awake alert, and oriented X 3, CN II-XII intact, Non focal Skin: No Rash  Data Review No results found for: "HGBA1C"  Assessment & Plan  Pregnancy, unspecified gestational age Keep scheduled appt with OBGYN   Patient have been counseled extensively about nutrition and exercise. Other issues discussed during this visit include: low cholesterol diet, weight control and daily exercise, foot care, annual eye examinations at Ophthalmology, importance of adherence with medications and regular follow-up. We also discussed long term complications of uncontrolled diabetes and hypertension.   No follow-ups on file.  The patient was given clear instructions to go to ER or return to medical center if symptoms don't improve, worsen or new problems develop. The patient verbalized understanding. The patient was told to call to get lab results if they haven't heard anything in the next week.   This note has been created with Surveyor, quantity. Any transcriptional errors are unintentional.   Kerin Perna, NP 07/01/2022, 9:02 AM

## 2022-08-17 ENCOUNTER — Telehealth: Payer: Self-pay | Admitting: Orthopedic Surgery

## 2022-08-17 ENCOUNTER — Ambulatory Visit: Payer: Self-pay | Admitting: Orthopedic Surgery

## 2022-08-26 ENCOUNTER — Ambulatory Visit: Payer: Self-pay | Admitting: Family

## 2022-08-29 IMAGING — MR MR ANKLE*L* W/O CM
5 series · 36 of 40 positions shown · non-contrast
Comparison: Left ankle and foot radiographs 09/11/2021

CLINICAL DATA: Deep ankle soft tissue mass. Ankle pain and swelling
for 6 months.

EXAM:
MRI OF THE LEFT ANKLE WITHOUT CONTRAST
TECHNIQUE: Multiplanar, multisequence MR imaging of the ankle was performed. No
intravenous contrast was administered.

[Series 4: T2 fat-sat · axial · 3.0mm · 0.50mm/px · z∈[-99,+22]mm · 9 of 32 slices shown (1 of 2)]
[im 1/32]
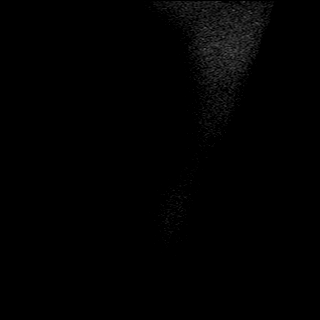
[im 4/32]
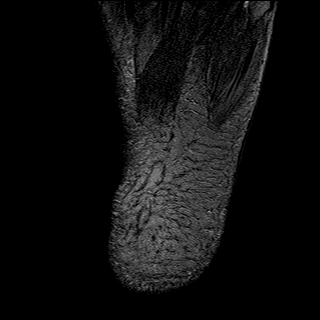
[im 8/32]
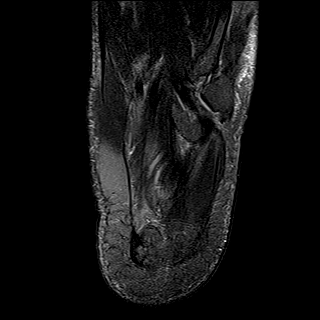
[im 12/32]
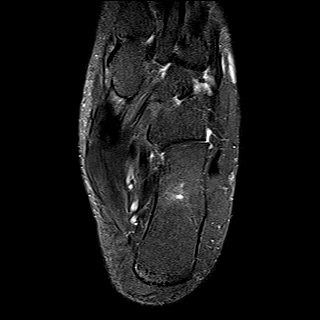
[im 16/32]
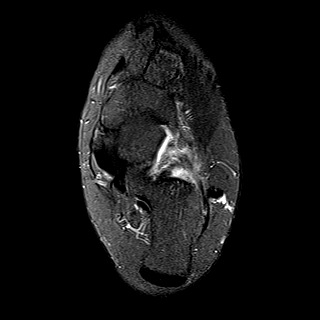
[im 20/32]
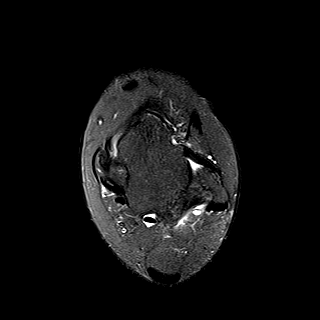
[im 24/32]
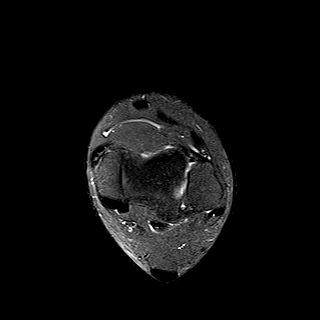
[im 28/32]
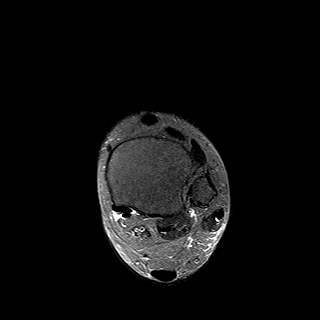
[im 32/32]
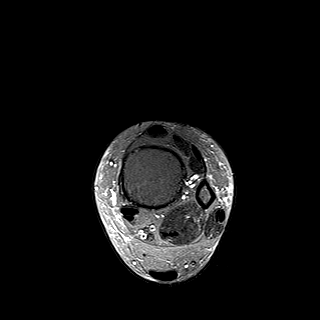

[Series 5: PD fat-sat · axial · 3.0mm · 0.42mm/px · z∈[-99,+22]mm · 9 of 32 slices shown]
[im 1/32]
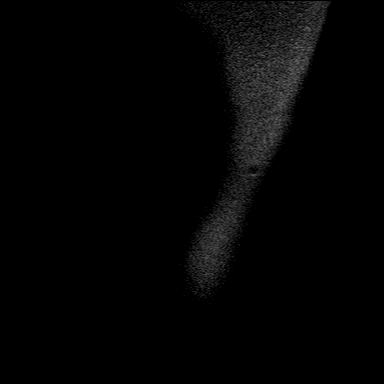
[im 4/32]
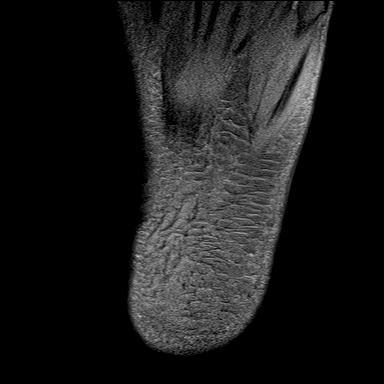
[im 8/32]
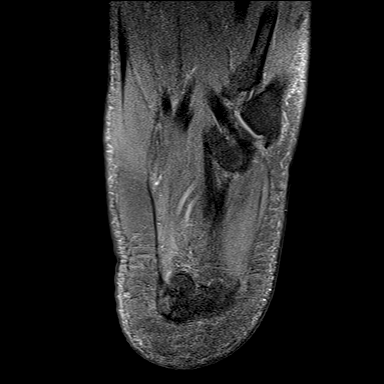
[im 12/32]
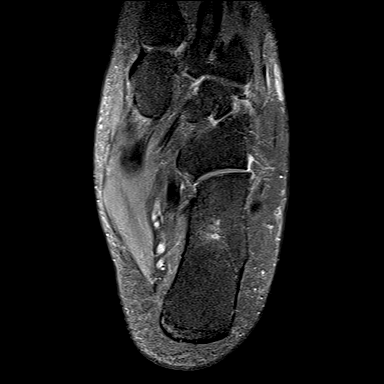
[im 16/32]
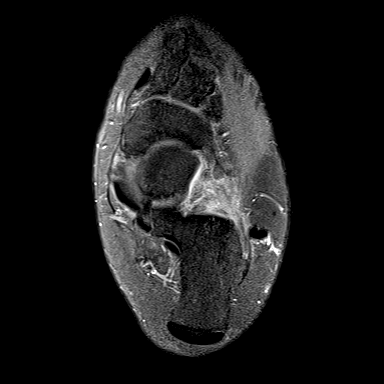
[im 20/32]
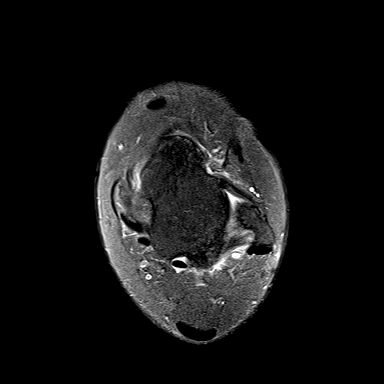
[im 24/32]
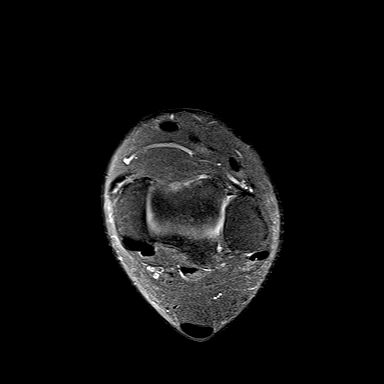
[im 28/32]
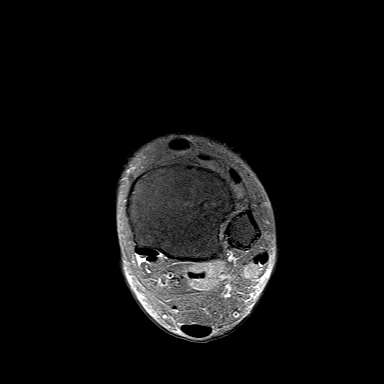
[im 32/32]
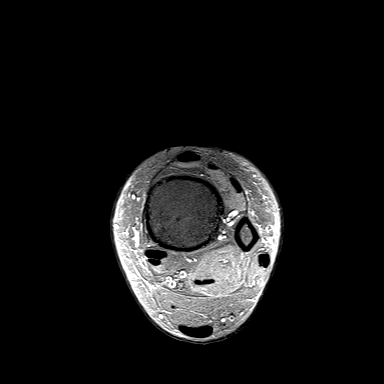

[Series 6: T1 · sagittal · 4.0mm · 0.56mm/px · 6 of 20 slices shown]
[im 1/20]
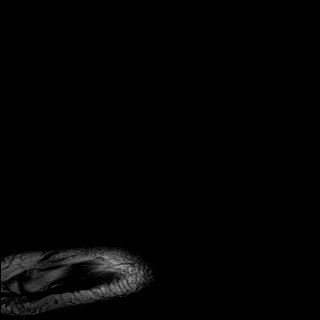
[im 4/20]
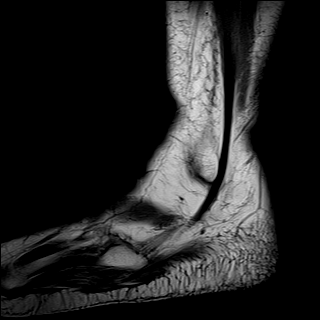
[im 8/20]
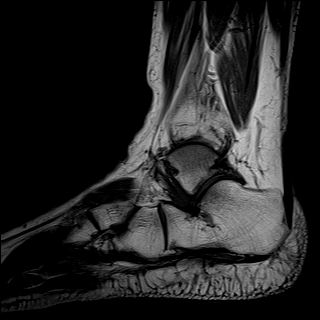
[im 12/20]
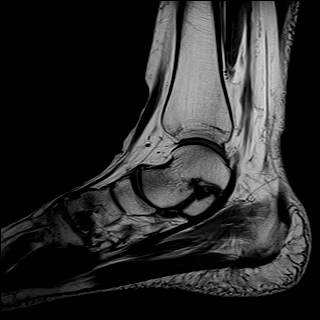
[im 16/20]
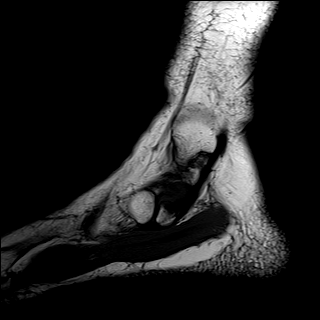
[im 20/20]
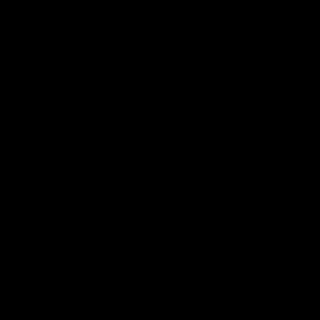

[Series 7: STIR · sagittal · 4.0mm · 0.35mm/px · 4 of 20 slices shown]
[im 1/20]
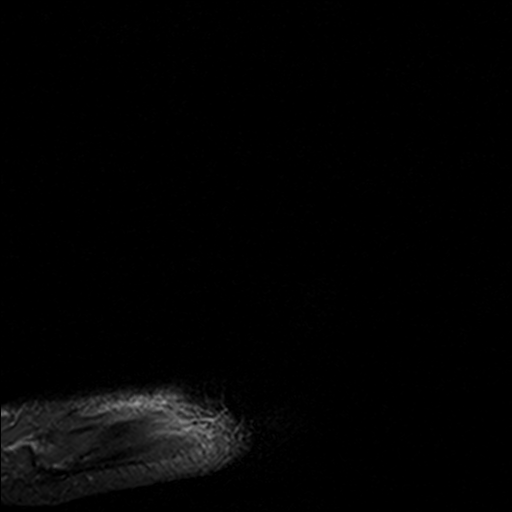
[im 4/20]
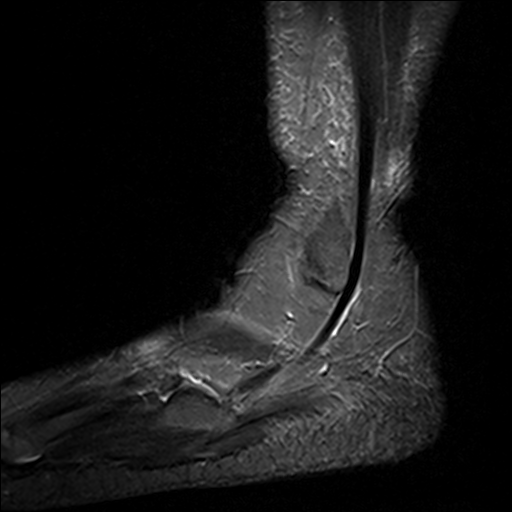
[im 8/20]
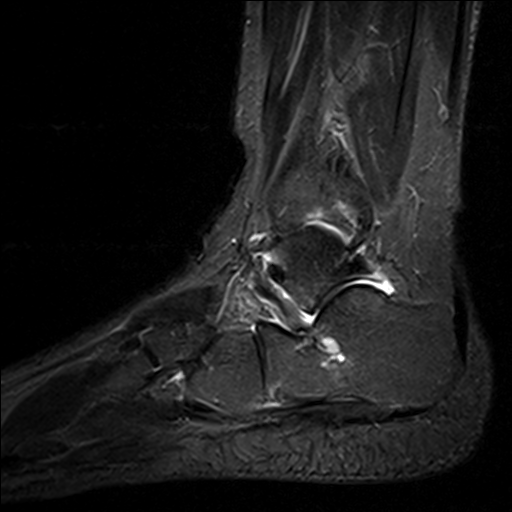
[im 12/20]
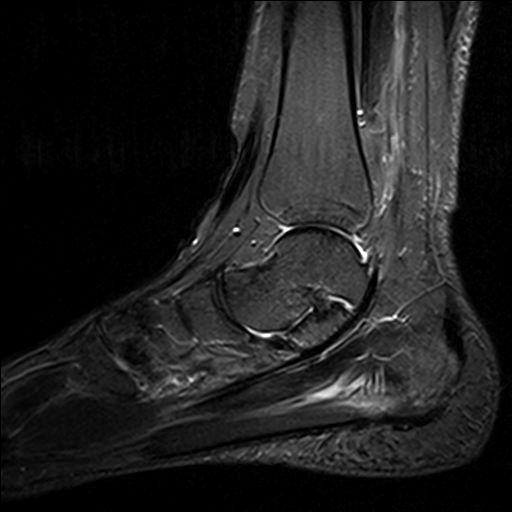

[Series 8: T2 fat-sat · coronal · 3.0mm · 0.50mm/px · 8 of 34 slices shown (2 of 2)]
[im 1/34]
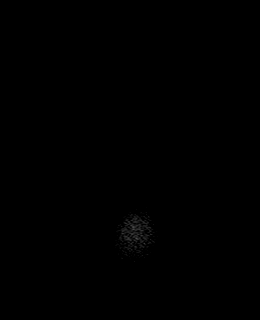
[im 4/34]
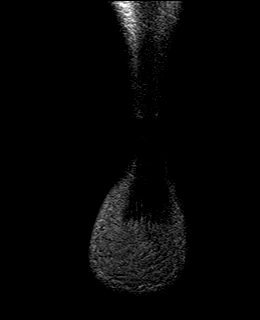
[im 12/34]
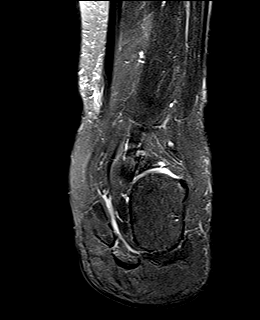
[im 15/34]
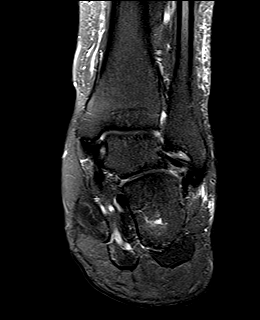
[im 19/34]
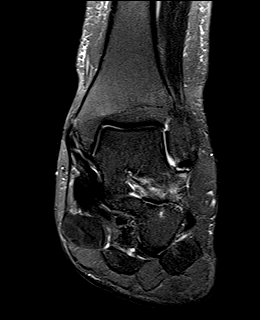
[im 23/34]
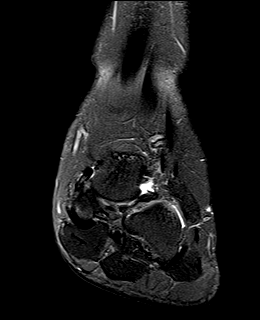
[im 30/34]
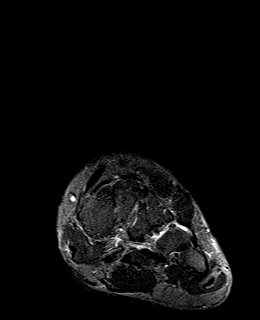
[im 34/34]
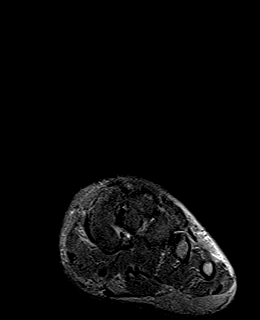

[36 of 40 positions shown; findings below may reference images not displayed]

FINDINGS: TENDONS

Peroneal: The peroneus longus and brevis tendons are intact.

Posteromedial: The posterior tibial tendon insertion is intact. The
tendon inserts on a normal variant large os naviculare measuring up
to 2 cm in transverse dimension there is mild-to-moderate focal
subchondral edema/cystic change within the ossicle deep to the
intact posterior tibial tendon. No significant edema arthritis is
seen across the synchondrosis of the os naviculare and dominant
navicular bone. The flexor digitorum longus and flexor hallucis
longus tendons are intact.

Anterior: The tibialis anterior, extensor hallucis longus and
extensor digitorum longus tendons are intact.

Achilles: Intact.

Plantar Fascia: Small plantar calcaneal heel spur. Minimal edema at
the far medial aspect of the medial band of the plantar fascia
(coronal series 8, image 11, sagittal series 7, image 9). This may
represent minimal plantar fasciitis at the flexor digitorum brevis
musculotendinous junction.

LIGAMENTS

Lateral: The anterior and posterior talofibular, anterior and
posterior tibiofibular, and calcaneofibular ligaments are intact.

Medial: The tibiotalar deep deltoid and tibial spring ligaments are
intact.

CARTILAGE

Ankle Joint: Intact cartilage.

Subtalar Joints/Sinus Tarsi: Fat is preserved within sinus tarsi.

Bones: There is mild decreased T1 increased T2 signal within the
posterosuperior aspect of the anterior process of the calcaneus, a
possible developmental vascular remnant versus chronic cystic change
inferior to the sinus tarsi.

Moderate calcaneocuboid and mild talonavicular and
navicular-cuneiform joint space narrowing.

The tarsal tunnel is unremarkable. The Lisfranc ligament complex is
intact.

Other: None.
IMPRESSION: :
IMPRESSION: 1. Posterior tibial tendon insertion is intact. Inserts on a fairly
large os naviculare normal-variant with chronic cystic change within
the ossicle just deep to the posterior tibial tendon.
2. Small plantar calcaneal heel spur with possible minimal plantar
fasciitis at the far medial aspect.
3. No soft tissue mass is identified.

## 2022-10-07 ENCOUNTER — Ambulatory Visit (INDEPENDENT_AMBULATORY_CARE_PROVIDER_SITE_OTHER): Payer: Self-pay | Admitting: Family

## 2022-10-07 ENCOUNTER — Encounter: Payer: Self-pay | Admitting: Family

## 2022-10-07 DIAGNOSIS — M7672 Peroneal tendinitis, left leg: Secondary | ICD-10-CM

## 2022-10-07 DIAGNOSIS — M76822 Posterior tibial tendinitis, left leg: Secondary | ICD-10-CM

## 2022-10-07 NOTE — Progress Notes (Signed)
Office Visit Note   Patient: Monica Peters           Date of Birth: 13-May-1986           MRN: [id number] Visit Date: 10/07/2022              Requested by: Kerin Perna, NP [address] [place],  [place] [address] PCP: Kerin Perna, NP  Chief Complaint  Patient presents with   Left Ankle - Follow-up      HPI: The patient is a 37 year old woman who has been dealing with ongoing left foot pain for nearly a year.  She is currently pregnant and notes that during her pregnancy her pain has worsened.  She initially had been primarily having pain over the medial foot and ankle and had been treated for posterior tibial insufficiency.  For some time she had been getting relief using an ASO and/or a cam walker.  She has had difficulty doing her work duties which require her to be on her feet.  Today she presents with new pain for the last month or more on the lateral aspect of her foot and ankle which feels similar to the medial foot pain no recent injuries no fever no chills no swelling no ecchymosis  Assessment & Plan: Visit Diagnoses: No diagnosis found.  Plan: Posterior tibial tendinitis on the left with some new peroneal tendon symptoms.  Discussed case with Dr. Rolena Infante.  Offered the patient cortisone injection with ultrasound she will consider this.  Today placed in a posterior tibial tendon brace on the left she voiced relief.  Follow-Up Instructions: No follow-ups on file.   Ortho Exam  Patient is alert, oriented, no adenopathy, well-dressed, normal affect, normal respiratory effort. On examination of the left foot and ankle there is no swelling she tender along the course of the posterior tibial tendon and peroneal tendons on the left.  Has pain with a single limb heel raise. Ankle ligaments nontender. the ankle stable.  Imaging: No results found. No images are attached to the encounter.  Labs: No results found for: "HGBA1C", "ESRSEDRATE", "CRP",  "LABURIC", "REPTSTATUS", "GRAMSTAIN", "CULT", "LABORGA"   Lab Results  Component Value Date   ALBUMIN 4.7 05/08/2021   ALBUMIN 4.6 09/10/2020    No results found for: "MG" Lab Results  Component Value Date   VD25OH 17.9 (L) 02/17/2022    No results found for: "PREALBUMIN"    Latest Ref Rng & Units 05/15/2022    4:32 PM 02/17/2022    9:54 AM 09/10/2020   10:21 AM  CBC EXTENDED  WBC 4.0 - 10.5 K/uL 9.2  5.8  5.1   RBC 3.87 - 5.11 MIL/uL 4.31  4.94  4.51   Hemoglobin 12.0 - 15.0 g/dL 13.6  15.2  13.8   HCT 36.0 - 46.0 % 38.8  45.4  40.5   Platelets 150 - 400 K/uL 272  204  291   NEUT# 1.7 - 7.7 K/uL 5.9  2.8  2.2   Lymph# 0.7 - 4.0 K/uL 2.0  2.4  2.3      There is no height or weight on file to calculate BMI.  Orders:  No orders of the defined types were placed in this encounter.  No orders of the defined types were placed in this encounter.    Procedures: No procedures performed  Clinical Data: No additional findings.  ROS:  All other systems negative, except as noted in the HPI. Review of Systems  Objective: Vital  Signs: LMP 03/30/2022 (Exact Date)   Specialty Comments:  No specialty comments available.  PMFS History: Patient Active Problem List   Diagnosis Date Noted   Abnormal uterine bleeding (AUB) 09/19/2020   Viral upper respiratory tract infection 08/11/2020   Nausea 08/11/2020   Dyspnea 05/14/2020   Past Medical History:  Diagnosis Date   Medical history non-contributory     History reviewed. No pertinent family history.  Past Surgical History:  Procedure Laterality Date   CHOLECYSTECTOMY  2021   Social History   Occupational History   Not on file  Tobacco Use   Smoking status: Never   Smokeless tobacco: Never  Vaping Use   Vaping Use: Never used  Substance and Sexual Activity   Alcohol use: Never   Drug use: Never   Sexual activity: Yes

## 2022-11-12 ENCOUNTER — Encounter: Payer: Self-pay | Admitting: Orthopedic Surgery

## 2022-11-12 ENCOUNTER — Ambulatory Visit (INDEPENDENT_AMBULATORY_CARE_PROVIDER_SITE_OTHER): Payer: Self-pay | Admitting: Orthopedic Surgery

## 2022-11-12 DIAGNOSIS — M7672 Peroneal tendinitis, left leg: Secondary | ICD-10-CM

## 2022-11-12 DIAGNOSIS — M76822 Posterior tibial tendinitis, left leg: Secondary | ICD-10-CM

## 2022-11-12 NOTE — Progress Notes (Signed)
Office Visit Note   Patient: Monica Peters           Date of Birth: [birth year]           MRN: [id number] Visit Date: 11/12/2022              Requested by: Kerin Perna, NP [address] [place],  [place] [address] PCP: Kerin Perna, NP  Chief Complaint  Patient presents with   Left Ankle - Pain      HPI: Patient is a 37 year old woman with history of posterior tibial tendinitis and peroneal tendinitis who had good relief with using a fracture boot in the past.  Patient is currently in an ASO and states that she is symptomatic.  Patient is currently pregnant.  Assessment & Plan: Visit Diagnoses:  1. Posterior tibial tendinitis, left leg   2. Peroneal tendinitis of left lower extremity     Plan: We will place her in a short fracture boot continue wearing this through her pregnancy.  Reevaluate in 3 months.  Follow-Up Instructions: Return in about 3 months (around 02/12/2023).   Ortho Exam  Patient is alert, oriented, no adenopathy, well-dressed, normal affect, normal respiratory effort. Examination the posterior tibial tendon and peroneal tendons are tender to palpation inferior to the malleolus.  The insertions of the tendons are nontender to palpation.  Patient does have an os navicular which is mildly symptomatic with palpation.  Patient has tried Voltaren gel but stopped this secondary to her pregnancy.  Imaging: No results found. No images are attached to the encounter.  Labs: No results found for: "HGBA1C", "ESRSEDRATE", "CRP", "LABURIC", "REPTSTATUS", "GRAMSTAIN", "CULT", "LABORGA"   Lab Results  Component Value Date   ALBUMIN 4.7 05/08/2021   ALBUMIN 4.6 09/10/2020    No results found for: "MG" Lab Results  Component Value Date   VD25OH 17.9 (L) 02/17/2022    No results found for: "PREALBUMIN"    Latest Ref Rng & Units 05/15/2022    4:32 PM 02/17/2022    9:54 AM 09/10/2020   10:21 AM  CBC EXTENDED  WBC 4.0 - 10.5 K/uL 9.2   5.8  5.1   RBC 3.87 - 5.11 MIL/uL 4.31  4.94  4.51   Hemoglobin 12.0 - 15.0 g/dL 13.6  15.2  13.8   HCT 36.0 - 46.0 % 38.8  45.4  40.5   Platelets 150 - 400 K/uL 272  204  291   NEUT# 1.7 - 7.7 K/uL 5.9  2.8  2.2   Lymph# 0.7 - 4.0 K/uL 2.0  2.4  2.3      There is no height or weight on file to calculate BMI.  Orders:  No orders of the defined types were placed in this encounter.  No orders of the defined types were placed in this encounter.    Procedures: No procedures performed  Clinical Data: No additional findings.  ROS:  All other systems negative, except as noted in the HPI. Review of Systems  Objective: Vital Signs: LMP 03/30/2022 (Exact Date)   Specialty Comments:  No specialty comments available.  PMFS History: Patient Active Problem List   Diagnosis Date Noted   Abnormal uterine bleeding (AUB) 09/19/2020   Viral upper respiratory tract infection 08/11/2020   Nausea 08/11/2020   Dyspnea 05/14/2020   Past Medical History:  Diagnosis Date   Medical history non-contributory     History reviewed. No pertinent family history.  Past Surgical History:  Procedure Laterality Date   CHOLECYSTECTOMY  2021   Social History   Occupational History   Not on file  Tobacco Use   Smoking status: Never   Smokeless tobacco: Never  Vaping Use   Vaping Use: Never used  Substance and Sexual Activity   Alcohol use: Never   Drug use: Never   Sexual activity: Yes

## 2023-02-15 ENCOUNTER — Ambulatory Visit: Payer: Self-pay | Admitting: Orthopedic Surgery

## 2023-04-21 ENCOUNTER — Ambulatory Visit (INDEPENDENT_AMBULATORY_CARE_PROVIDER_SITE_OTHER): Payer: Self-pay | Admitting: Primary Care

## 2023-04-21 ENCOUNTER — Telehealth (INDEPENDENT_AMBULATORY_CARE_PROVIDER_SITE_OTHER): Payer: Self-pay

## 2023-04-21 VITALS — BP 130/85 | HR 100 | Resp 16 | Wt 244.4 lb

## 2023-04-21 DIAGNOSIS — E559 Vitamin D deficiency, unspecified: Secondary | ICD-10-CM

## 2023-04-21 DIAGNOSIS — Z0000 Encounter for general adult medical examination without abnormal findings: Secondary | ICD-10-CM

## 2023-04-21 DIAGNOSIS — Z124 Encounter for screening for malignant neoplasm of cervix: Secondary | ICD-10-CM

## 2023-04-21 DIAGNOSIS — Z30011 Encounter for initial prescription of contraceptive pills: Secondary | ICD-10-CM

## 2023-04-21 DIAGNOSIS — R519 Headache, unspecified: Secondary | ICD-10-CM

## 2023-04-21 DIAGNOSIS — R202 Paresthesia of skin: Secondary | ICD-10-CM

## 2023-04-21 NOTE — Telephone Encounter (Signed)
Copied from CRM [id number]. Topic: General - Other >> Apr 21, 2023  1:52 PM Franchot Heidelberg wrote: Reason for CRM: Pt has another question for PCP and says she does not have access to her mychart. She says this is regarding birth control. She is wants a refill of the same that she was taking last year. Please advise

## 2023-04-21 NOTE — Progress Notes (Signed)
[facility]  WELL-WOMAN PHYSICAL & PAP Patient name: Monica Peters MRN [id number]  Date of birth: [birth year] Chief Complaint:   Annual Exam and Gynecologic Exam  History of Present Illness:   Monica Peters is a 37 y.o. [id number] female being seen today for a routine well-woman exam.   CC: Pt complains of still having headaches and b/l hands are burning and numb. Right hand pain radiates up to elbow and left hand pain radiates up to the shoulder. Pt states pain today is 6 out of 10.    The current method of family planning is none.  No LMP recorded. Last pap 8/25/201. Results were: normal Last mammogram: Patient denies feeling lumps in breast.  Family h/o breast cancer: No Last colonoscopy: Pt not of age  Family h/o colorectal cancer: No  Review of Systems:    Comprehensive ROS Pertinent positive and negative noted in HPI    Denies any blurred vision, fatigue, shortness of breath, chest pain, abdominal pain, abnormal vaginal discharge/itching/odor/irritation, problems with periods, bowel movements, urination, or intercourse unless otherwise stated above.  Pertinent History Reviewed:   Reviewed past medical,surgical, social and family history.  Reviewed problem list, medications and allergies.  Physical Assessment:   Vitals:   04/21/23 0954 04/21/23 0959  BP: (Abnormal) 135/92 130/85  Pulse: 98 100  Resp: 16   SpO2: 99%   Weight: 244 lb 6.4 oz (110.9 kg)   Body mass index is 38.86 kg/m.        Physical Examination:  General appearance - well appearing, and in no distress Mental status - alert, oriented to person, place, and time Psych:  She has a normal mood and affect Skin - warm and dry, normal color, no suspicious lesions noted Chest - effort normal, all lung fields clear to auscultation bilaterally Heart - normal rate and regular rhythm Neck:  midline trachea, no thyromegaly or nodules Breasts - breasts appear normal, no suspicious  masses, no skin or nipple changes or axillary nodes Educated patient on proper self breast examination and had patient to demonstrate SBE. Abdomen - soft, nontender, nondistended, no masses or organomegaly Pelvic-VULVA: normal appearing vulva with no masses, tenderness or lesions   VAGINA: normal appearing vagina with normal color and discharge, no lesions   CERVIX: normal appearing cervix without discharge or lesions, no CMT UTERUS: uterus is felt to be normal size, shape, consistency and nontender  ADNEXA: No adnexal masses or tenderness noted. Extremities:  No swelling or varicosities noted  No results found for this or any previous visit (from the past 24 hour(s)).   Assessment & Plan:  Monica Peters was seen today for annual exam and gynecologic exam.  Diagnoses and all orders for this visit:  Cervical cancer screening -     Cytology - PAP -     Cervicovaginal ancillary only  Annual physical exam  Morbid obesity (HCC) Recently had a baby and just stop breast feeding Discussed diet and exercise for person with BMI >25. Instructed: You must burn more calories than you eat. Losing 5 percent of your body weight should be considered a success. In the longer term, losing more than 15 percent of your body weight and staying at this weight is an extremely good result. However, keep in mind that even losing 5 percent of your body weight leads to important health benefits, so try not to get discouraged if you're not able to lose more than this. Will recheck weight in 3-6 months.   Generalized headaches  Denies alcohol , obsessive amounts of caffeine, chocolate or sensitivity to light unable to deterime the cause or any correlation asked to keep a HA diary   Paresthesia of hand, bilateral palpable  Unknown etiology will monitor follow-up check A1c, full blood workup  Encounter for initial prescription of contraceptive pills Urine pregnancy test negative  No LMP recorded.  -      Norethindrone-Ethinyl Estradiol-Fe Biphas (LO LOESTRIN FE) 1 MG-10 MCG / 10 MCG tablet; Take 1 tablet by mouth daily.    Meds:  Meds ordered this encounter  Medications   Norethindrone-Ethinyl Estradiol-Fe Biphas (LO LOESTRIN FE) 1 MG-10 MCG / 10 MCG tablet    Sig: Take 1 tablet by mouth daily.    Dispense:  30 tablet    Refill:  11    Order Specific Question:   Supervising Provider    Answer:   Quentin Angst [[id number]]    Follow-up: No follow-ups on file.  This note has been created with Education officer, environmental. Any transcriptional errors are unintentional.   Grayce Sessions, NP 04/24/2023, 2:41 PM

## 2023-04-21 NOTE — Telephone Encounter (Signed)
Contacted patient per Lawana Pai we will need urine pregnancy before refilling birthcontrol patient aware will return 04/22/2023.

## 2023-04-22 ENCOUNTER — Ambulatory Visit (INDEPENDENT_AMBULATORY_CARE_PROVIDER_SITE_OTHER): Payer: Self-pay

## 2023-04-22 DIAGNOSIS — Z3009 Encounter for other general counseling and advice on contraception: Secondary | ICD-10-CM

## 2023-04-23 ENCOUNTER — Telehealth (INDEPENDENT_AMBULATORY_CARE_PROVIDER_SITE_OTHER): Payer: Self-pay | Admitting: Primary Care

## 2023-04-23 LAB — POCT URINE PREGNANCY: Preg Test, Ur: NEGATIVE

## 2023-04-23 NOTE — Telephone Encounter (Signed)
Patient called to inquire about urine pregnancy test completed 04/23/23. Unable to release results to patient, provider has not reviewed labs results. Please advise patient once provider had reviewed the urine pregnancy results.

## 2023-04-24 ENCOUNTER — Other Ambulatory Visit (HOSPITAL_COMMUNITY)
Admission: RE | Admit: 2023-04-24 | Discharge: 2023-04-24 | Disposition: A | Discharge status: Home / Self Care | Payer: Self-pay | Source: Ambulatory Visit | Attending: Primary Care | Admitting: Primary Care

## 2023-04-24 DIAGNOSIS — Z124 Encounter for screening for malignant neoplasm of cervix: Secondary | ICD-10-CM | POA: Insufficient documentation

## 2023-04-24 MED ORDER — LO LOESTRIN FE 1 MG-10 MCG / 10 MCG PO TABS
1.0000 | ORAL_TABLET | Freq: Every day | ORAL | 11 refills | Status: DC
  Filled 2023-04-24: qty 30, 30d supply, fill #0

## 2023-04-24 MED ORDER — VITAMIN D3 50 MCG (2000 UT) PO CAPS
2000.0000 [IU] | ORAL_CAPSULE | Freq: Every day | ORAL | 1 refills | Status: AC
  Filled 2023-04-24 – 2023-08-05 (×3): qty 90, 90d supply, fill #0
  Filled 2023-11-25: qty 90, 90d supply, fill #1

## 2023-04-26 ENCOUNTER — Other Ambulatory Visit: Payer: Self-pay

## 2023-04-26 NOTE — Telephone Encounter (Signed)
Pt is aware of results and birth control has been sent to pharmacy

## 2023-04-27 ENCOUNTER — Other Ambulatory Visit: Payer: Self-pay

## 2023-04-27 ENCOUNTER — Other Ambulatory Visit (INDEPENDENT_AMBULATORY_CARE_PROVIDER_SITE_OTHER): Payer: Self-pay | Admitting: Primary Care

## 2023-04-27 DIAGNOSIS — Z30011 Encounter for initial prescription of contraceptive pills: Secondary | ICD-10-CM

## 2023-04-27 MED ORDER — TARINA 24 FE 1-20 MG-MCG(24) PO TABS
1.0000 | ORAL_TABLET | Freq: Every day | ORAL | 11 refills | Status: DC
Start: 2023-04-27 — End: 2024-03-29
  Filled 2023-04-27: qty 28, 28d supply, fill #0
  Filled 2023-05-27: qty 28, 28d supply, fill #1
  Filled 2023-06-25: qty 84, 84d supply, fill #2
  Filled 2023-08-30 – 2023-09-09 (×2): qty 84, 84d supply, fill #3
  Filled 2023-11-25: qty 84, 84d supply, fill #4
  Filled 2024-02-25: qty 28, 28d supply, fill #5

## 2023-04-28 LAB — CERVICOVAGINAL ANCILLARY ONLY
Bacterial Vaginitis (gardnerella): NEGATIVE
Candida Glabrata: NEGATIVE
Candida Vaginitis: NEGATIVE
Chlamydia: NEGATIVE
Comment: NEGATIVE
Comment: NEGATIVE
Comment: NEGATIVE
Comment: NEGATIVE
Comment: NEGATIVE
Comment: NORMAL
Neisseria Gonorrhea: NEGATIVE
Trichomonas: NEGATIVE

## 2023-04-30 ENCOUNTER — Other Ambulatory Visit: Payer: Self-pay

## 2023-05-06 LAB — CYTOLOGY - PAP
Comment: NEGATIVE
Diagnosis: NEGATIVE
High risk HPV: NEGATIVE

## 2023-05-21 ENCOUNTER — Ambulatory Visit (INDEPENDENT_AMBULATORY_CARE_PROVIDER_SITE_OTHER): Payer: Self-pay

## 2023-05-21 NOTE — Telephone Encounter (Signed)
Chief Complaint: Irregular bleeding Symptoms: moderate vaginal bleeding, small clots, lower back pain, headache Frequency: constant Pertinent Negatives: Patient denies feeling weak, fever, pregnancy Disposition: [] ED /[x] Urgent Care (no appt availability in office) / [] Appointment(In office/virtual)/ []  [facility]/ [] Home Care/ [] Refused Recommended Disposition /[] [place] [facility]/ []  Follow-up with PCP Additional Notes: Patient states she has had a range of vaginal bleeding over the past 2 weeks. It started with spotting, than progressed to moderate bleeding with same clots. She also stated she has a headache, and lower back pain. Patient states she has not had a regular period since her pregnancy and she had a negative pregnancy test before starting her birth control pills. Patient stated she is taking the birth control as directed. Care advice was given and patient stated she would go to urgent care as a walk-in when she has transportation tomorrow. Advised patient to callback if symptoms get worse. Patient verbalized understanding. Using [facility] IO#[id number].  Summary: Menstrual Cycle for 2 weeks advice   Pt is calling report that she has had her menstrual cycle for 2 weeks with back pain. Pt would like to know is this normal. Please advise     Reason for Disposition  MODERATE vaginal bleeding (e.g., soaking 1 pad or tampon per hour and present > 6 hours; 1 menstrual cup every 6 hours)  Answer Assessment - Initial Assessment Questions 1. AMOUNT: "Describe the bleeding that you are having."    - SPOTTING: spotting, or pinkish / brownish mucous discharge; does not fill panty liner or pad    - MILD:  less than 1 pad / hour; less than patient's usual menstrual bleeding   - MODERATE: 1-2 pads / hour; 1 menstrual cup every 6 hours; small-medium blood clots (e.g., pea, grape, small coin)   - SEVERE: soaking 2 or more pads/hour for 2 or more hours; 1 menstrual cup  every 2 hours; bleeding not contained by pads or continuous red blood from vagina; large blood clots (e.g., golf ball, large coin)      Moderate 1 to 2 pads every hour small clots 2. ONSET: "When did the bleeding begin?" "Is it continuing now?"     2 weeks ago 3. MENSTRUAL PERIOD: "When was the last normal menstrual period?" "How is this different than your period?"     June or July last year 4. REGULARITY: "How regular are your periods?"     I haven't had one since having my baby 4 months ago 5. ABDOMEN PAIN: "Do you have any pain?" "How bad is the pain?"  (e.g., Scale 1-10; mild, moderate, or severe)   - MILD (1-3): doesn't interfere with normal activities, abdomen soft and not tender to touch    - MODERATE (4-7): interferes with normal activities or awakens from sleep, abdomen tender to touch    - SEVERE (8-10): excruciating pain, doubled over, unable to do any normal activities      6/10 6. PREGNANCY: "Is there any chance you are pregnant?" "When was your last menstrual period?"     No, I'm taking bill control pills 7. BREASTFEEDING: "Are you breastfeeding?"     No 8. HORMONE MEDICINES: "Are you taking any hormone medicines, prescription or over-the-counter?" (e.g., birth control pills, estrogen)     Birth control pills 9. BLOOD THINNER MEDICINES: "Do you take any blood thinners?" (e.g., Coumadin / warfarin, Pradaxa / dabigatran, aspirin)     No  10. CAUSE: "What do you think is causing the bleeding?" (e.g.,  recent gyn surgery, recent gyn procedure; known bleeding disorder, cervical cancer, polycystic ovarian disease, fibroids)         I don't know 11. HEMODYNAMIC STATUS: "Are you weak or feeling lightheaded?" If Yes, ask: "Can you stand and walk normally?"        No not now 12. OTHER SYMPTOMS: "What other symptoms are you having with the bleeding?" (e.g., passed tissue, vaginal discharge, fever, menstrual-type cramps)       Back pain, headache, blurred vision  Protocols used: Vaginal  Bleeding - Abnormal-A-AH

## 2023-05-24 NOTE — Telephone Encounter (Signed)
Will forward to provider  

## 2023-05-25 ENCOUNTER — Ambulatory Visit (HOSPITAL_COMMUNITY)
Admission: EM | Admit: 2023-05-25 | Discharge: 2023-05-25 | Disposition: A | Discharge status: Home / Self Care | Payer: Self-pay | Attending: Internal Medicine | Admitting: Internal Medicine

## 2023-05-25 ENCOUNTER — Encounter (HOSPITAL_COMMUNITY): Payer: Self-pay | Admitting: Emergency Medicine

## 2023-05-25 ENCOUNTER — Other Ambulatory Visit: Payer: Self-pay

## 2023-05-25 DIAGNOSIS — K2100 Gastro-esophageal reflux disease with esophagitis, without bleeding: Secondary | ICD-10-CM

## 2023-05-25 DIAGNOSIS — R1013 Epigastric pain: Secondary | ICD-10-CM

## 2023-05-25 DIAGNOSIS — R519 Headache, unspecified: Secondary | ICD-10-CM

## 2023-05-25 MED ORDER — ONDANSETRON 4 MG PO TBDP
4.0000 mg | ORAL_TABLET | Freq: Three times a day (TID) | ORAL | 0 refills | Status: AC | PRN
  Filled 2023-05-25: qty 20, 7d supply, fill #0

## 2023-05-25 MED ORDER — KETOROLAC TROMETHAMINE 30 MG/ML IJ SOLN
30.0000 mg | Freq: Once | INTRAMUSCULAR | Status: AC
  Administered 2023-05-25: 30 mg via INTRAMUSCULAR

## 2023-05-25 MED ORDER — LIDOCAINE VISCOUS HCL 2 % MT SOLN
OROMUCOSAL | Status: AC
  Filled 2023-05-25: qty 15

## 2023-05-25 MED ORDER — ALUM & MAG HYDROXIDE-SIMETH 200-200-20 MG/5ML PO SUSP
ORAL | Status: AC
  Filled 2023-05-25: qty 30

## 2023-05-25 MED ORDER — PANTOPRAZOLE SODIUM 20 MG PO TBEC
20.0000 mg | DELAYED_RELEASE_TABLET | Freq: Every day | ORAL | 0 refills | Status: AC
  Filled 2023-05-25: qty 30, 30d supply, fill #0

## 2023-05-25 MED ORDER — ONDANSETRON 4 MG PO TBDP
ORAL_TABLET | ORAL | Status: AC
  Filled 2023-05-25: qty 1

## 2023-05-25 MED ORDER — ALUM & MAG HYDROXIDE-SIMETH 200-200-20 MG/5ML PO SUSP
30.0000 mL | Freq: Once | ORAL | Status: AC
  Administered 2023-05-25: 30 mL via ORAL

## 2023-05-25 MED ORDER — LIDOCAINE VISCOUS HCL 2 % MT SOLN
15.0000 mL | Freq: Once | OROMUCOSAL | Status: AC
  Administered 2023-05-25: 15 mL via OROMUCOSAL

## 2023-05-25 MED ORDER — KETOROLAC TROMETHAMINE 30 MG/ML IJ SOLN
INTRAMUSCULAR | Status: AC
  Filled 2023-05-25: qty 1

## 2023-05-25 MED ORDER — ONDANSETRON 4 MG PO TBDP
4.0000 mg | ORAL_TABLET | Freq: Once | ORAL | Status: AC
  Administered 2023-05-25: 4 mg via ORAL

## 2023-05-25 NOTE — Discharge Instructions (Addendum)
Tome los medicamentos recetados segn las indicaciones. Los medicamentos ayudarn a reducir la cantidad de cido que produce su estmago y, por lo tanto, mejorarn los sntomas de reflujo relacionados con la produccin de cido. Protonix una vez al da para reducir el cido en su [employer]. Tums segn sea necesario. Zofran cada 8 horas segn sea necesario para las nuseas y los vmitos.  Evite los alimentos picantes o cidos como los tomates, el chocolate, el caf o las frutas cidas como las naranjas, ya que pueden [employer] sntomas. He incluido informacin sobre el reflujo cido en su paquete para que la revise. Tambin espere 2 horas despus de las comidas antes de acostarse para ayudar a [employer]. Coma comidas pequeas y frecuentes para evitar largos perodos de tiempo sin comida en su estmago.  Le di una inyeccin de ketorolaco (un potente antiinflamatorio) en la clnica. Evite tomar ibuprofeno/naproxeno.  Si sus sntomas no mejoran en los prximos 5 a [address] con las intervenciones, regrese. Acuda a urgencias si tiene sntomas graves como dificultad para respirar, dolor abdominal o torcico que [place] o no se controla, dolor de [place], sensacin de [place], nuseas, vmitos, vmitos o heces con sangre, heces negras y alquitranadas o cualquier otro sntoma nuevo o grave. Espero que se sienta mejor!   Take prescribed medicines as directed. Medicine will help reduce the amount of acid your stomach makes and therefore improve your reflux symptoms related to acid production.  Protonix once daily to reduce acid in your stomach. Tums as needed. Zofran every 8 hours as needed for nausea and vomiting.  Avoid spicy or acidic foods like tomatoes, chocolate, coffee, or acidic fruits like oranges as these can trigger symptoms.  I have included acid reflux education in your packet for your review. Please also allow 2 hours after meals before lying flat to help prevent  symptoms.  Eat small frequent meals to avoid long periods of time without food on your stomach.  I gave you a shot of ketorolac (strong antiinflammatory) in clinic. Avoid taking ibuprofen/naproxen.   If your symptoms do not improve in the next 5-7 days with interventions, please return. Go to the emergency room for severe symptoms of shortness of breath, worsening or uncontrolled abdominal or chest pain, headache, light headedness, feeling faint, nausea, vomiting, bloody vomit or stools, black tarry stools, or any other new/severe symptoms. I hope you feel better!

## 2023-05-25 NOTE — ED Triage Notes (Signed)
Pt reports since Thursday had intermittent spells of headaches, nausea, "sensation in my stomach that I can't explain", and heart burn. Took Tums, and Naproxen.

## 2023-05-25 NOTE — ED Provider Notes (Signed)
[facility]    CSN: [id number] Arrival date & time: 05/25/23  0848      History   Chief Complaint Chief Complaint  Patient presents with   Abdominal Pain   Headache    HPI Monica Peters is a 37 y.o. female.   Patient presents to urgent care for evaluation of abdominal discomfort to the epigastrium, generalized headache, intermittent dizziness, and esophageal burning that started approximately 1 week ago.  Symptoms are worsened by laying flat and after eating spicy foods.  She ate some cereal with milk and this also caused significant acid reflux.  Tums makes symptoms better temporarily.  Dizziness and headache improved slightly after eating, however she has had reduced appetite secondary to pain at the epigastrium after eating.  Headache is generalized.  No vision changes, viral URI symptoms, rash, fever/chills, or diarrhea.  Also reports intermittent nausea without vomiting.  This has never happened in the past.  She has been using naproxen to help with her headache without much relief, otherwise denies recent excessive intake of NSAIDs.  She is not a smoker.  Has not attempted use of any other medications to help with symptoms PTA.  Last dose of naproxen was 2 days ago.    Abdominal Pain Headache Associated symptoms: abdominal pain     Past Medical History:  Diagnosis Date   Medical history non-contributory     Patient Active Problem List   Diagnosis Date Noted   Abnormal uterine bleeding (AUB) 09/19/2020   Viral upper respiratory tract infection 08/11/2020   Nausea 08/11/2020   Dyspnea 05/14/2020    Past Surgical History:  Procedure Laterality Date   CHOLECYSTECTOMY  2021    OB History     Gravida  6   Para  3   Term  2   Preterm  1   AB  2   Living  3      SAB  2   IAB  0   Ectopic  0   Multiple  0   Live Births  3            Home Medications    Prior to Admission medications   Medication Sig Start Date End Date  Taking? Authorizing Provider  ondansetron (ZOFRAN-ODT) 4 MG disintegrating tablet Take 1 tablet (4 mg total) by mouth every 8 (eight) hours as needed for nausea or vomiting. 05/25/23  Yes Carlisle Beers, FNP  pantoprazole (PROTONIX) 20 MG tablet Take 1 tablet (20 mg total) by mouth daily. 05/25/23  Yes Carlisle Beers, FNP  acetaminophen (TYLENOL) 325 MG tablet Take 650 mg by mouth every 6 (six) hours as needed for mild pain.    [provider]  albuterol (VENTOLIN HFA) 108 (90 Base) MCG/ACT inhaler Inhale 2 puffs into the lungs every 6 (six) hours as needed for wheezing or shortness of breath. 04/12/20   Grayce Sessions, NP  Cholecalciferol (VITAMIN D3) 50 MCG (2000 UT) capsule Take 1 capsule (2,000 Units total) by mouth daily. 04/24/23   Grayce Sessions, NP  Norethindrone Acetate-Ethinyl Estrad-FE (TARINA 24 FE) 1-20 MG-MCG(24) tablet Take 1 tablet by mouth daily. 04/27/23   Grayce Sessions, NP    Family History No family history on file.  Social History Social History   Tobacco Use   Smoking status: Never   Smokeless tobacco: Never  Vaping Use   Vaping status: Never Used  Substance Use Topics   Alcohol use: Never   Drug use: Never  Allergies   Patient has no known allergies.   Review of Systems Review of Systems  Gastrointestinal:  Positive for abdominal pain.  Neurological:  Positive for headaches.  Per HPI   Physical Exam Triage Vital Signs ED Triage Vitals  Encounter Vitals Group     BP 05/25/23 0930 116/82     Systolic BP Percentile --      Diastolic BP Percentile --      Pulse Rate 05/25/23 0930 89     Resp 05/25/23 0930 15     Temp 05/25/23 0930 98.2 F (36.8 C)     Temp Source 05/25/23 0930 Oral     SpO2 05/25/23 0930 98 %     Weight --      Height --      Head Circumference --      Peak Flow --      Pain Score 05/25/23 0929 7     Pain Loc --      Pain Education --      Exclude from Growth Chart --    No data  found.  Updated Vital Signs BP 116/82 (BP Location: Right Arm)   Pulse 89   Temp 98.2 F (36.8 C) (Oral)   Resp 15   LMP 05/11/2023 (Approximate)   SpO2 98%   Visual Acuity Right Eye Distance:   Left Eye Distance:   Bilateral Distance:    Right Eye Near:   Left Eye Near:    Bilateral Near:     Physical Exam Vitals and nursing note reviewed.  Constitutional:      Appearance: She is not ill-appearing or toxic-appearing.  HENT:     Head: Normocephalic and atraumatic.     Right Ear: Hearing and external ear normal.     Left Ear: Hearing and external ear normal.     Nose: Nose normal.     Mouth/Throat:     Lips: Pink.     Mouth: Mucous membranes are moist. No injury.     Tongue: No lesions. Tongue does not deviate from midline.     Palate: No mass and lesions.     Pharynx: Oropharynx is clear. Uvula midline. No pharyngeal swelling, oropharyngeal exudate, posterior oropharyngeal erythema or uvula swelling.     Tonsils: No tonsillar exudate or tonsillar abscesses.  Eyes:     General: Lids are normal. Vision grossly intact. Gaze aligned appropriately.     Extraocular Movements: Extraocular movements intact.     Conjunctiva/sclera: Conjunctivae normal.  Cardiovascular:     Rate and Rhythm: Normal rate and regular rhythm.     Heart sounds: Normal heart sounds, S1 normal and S2 normal.  Pulmonary:     Effort: Pulmonary effort is normal. No respiratory distress.     Breath sounds: Normal breath sounds and air entry.  Abdominal:     General: Abdomen is flat. Bowel sounds are normal.     Palpations: Abdomen is soft.     Tenderness: There is abdominal tenderness in the epigastric area. There is no right CVA tenderness, left CVA tenderness or guarding.  Musculoskeletal:     Cervical back: Neck supple.  Skin:    General: Skin is warm and dry.     Capillary Refill: Capillary refill takes less than 2 seconds.     Findings: No rash.  Neurological:     General: No focal deficit  present.     Mental Status: She is alert and oriented to person, place, and time. Mental status is  at baseline.     Cranial Nerves: No dysarthria or facial asymmetry.  Psychiatric:        Mood and Affect: Mood normal.        Speech: Speech normal.        Behavior: Behavior normal.        Thought Content: Thought content normal.        Judgment: Judgment normal.      UC Treatments / Results  Labs (all labs ordered are listed, but only abnormal results are displayed) Labs Reviewed - No data to display  EKG   Radiology No results found.  Procedures Procedures (including critical care time)  Medications Ordered in UC Medications  alum & mag hydroxide-simeth (MAALOX/MYLANTA) 200-200-20 MG/5ML suspension 30 mL (has no administration in time range)  lidocaine (XYLOCAINE) 2 % viscous mouth solution 15 mL (has no administration in time range)  ketorolac (TORADOL) 30 MG/ML injection 30 mg (has no administration in time range)  ondansetron (ZOFRAN-ODT) disintegrating tablet 4 mg (has no administration in time range)    Initial Impression / Assessment and Plan / UC Course  I have reviewed the triage vital signs and the nursing notes.  Pertinent labs & imaging results that were available during my care of the patient were reviewed by me and considered in my medical decision making (see chart for details).   1. GERD, bad headache, abdominal pain Evaluation suggests gastritis etiology.  Low suspicion for ACS/musculoskeletal etiology of discomfort. Will trial use of protonix as directed.  GI cocktail, ketorolac 30mg  IM for headache, and 4mg  zofran ODT given in clinic for symptomatic relief. Discussed avoidance of GERD triggers and lifestyle changes (elevating head of bed, etc).  Further information placed in AVS for patient review.  PCP follow-up in 1-2 weeks should symptoms fail to improve with use of medicines above.  Counseled patient on potential for adverse effects with  medications prescribed/recommended today, strict ER and return-to-clinic precautions discussed, patient verbalized understanding.    Final Clinical Impressions(s) / UC Diagnoses   Final diagnoses:  Gastroesophageal reflux disease with esophagitis without hemorrhage  Bad headache  Abdominal pain, epigastric     Discharge Instructions      Tome los medicamentos recetados segn las indicaciones. Los medicamentos ayudarn a reducir la cantidad de cido que produce su estmago y, por lo tanto, mejorarn los sntomas de reflujo relacionados con la produccin de cido. Protonix una vez al da para reducir el cido en su [employer]. Tums segn sea necesario. Zofran cada 8 horas segn sea necesario para las nuseas y los vmitos.  Evite los alimentos picantes o cidos como los tomates, el chocolate, el caf o las frutas cidas como las naranjas, ya que pueden [employer] sntomas. He incluido informacin sobre el reflujo cido en su paquete para que la revise. Tambin espere 2 horas despus de las comidas antes de acostarse para ayudar a [employer]. Coma comidas pequeas y frecuentes para evitar largos perodos de tiempo sin comida en su estmago.  Le di una inyeccin de ketorolaco (un potente antiinflamatorio) en la clnica. Evite tomar ibuprofeno/naproxeno.  Si sus sntomas no mejoran en los prximos 5 a [address] con las intervenciones, regrese. Acuda a urgencias si tiene sntomas graves como dificultad para respirar, dolor abdominal o torcico que [place] o no se controla, dolor de [place], [place], sensacin de [place], nuseas, vmitos, vmitos o heces con sangre, heces negras y alquitranadas o cualquier otro sntoma nuevo o grave. Espero que se sienta mejor!  Take prescribed medicines as directed. Medicine will help reduce the amount of acid your stomach makes and therefore improve your reflux symptoms related to acid production.  Protonix once daily to reduce acid in your  stomach. Tums as needed. Zofran every 8 hours as needed for nausea and vomiting.  Avoid spicy or acidic foods like tomatoes, chocolate, coffee, or acidic fruits like oranges as these can trigger symptoms.  I have included acid reflux education in your packet for your review. Please also allow 2 hours after meals before lying flat to help prevent symptoms.  Eat small frequent meals to avoid long periods of time without food on your stomach.  I gave you a shot of ketorolac (strong antiinflammatory) in clinic. Avoid taking ibuprofen/naproxen.   If your symptoms do not improve in the next 5-7 days with interventions, please return. Go to the emergency room for severe symptoms of shortness of breath, worsening or uncontrolled abdominal or chest pain, headache, light headedness, feeling faint, nausea, vomiting, bloody vomit or stools, black tarry stools, or any other new/severe symptoms. I hope you feel better!      ED Prescriptions     Medication Sig Dispense Auth. Provider   pantoprazole (PROTONIX) 20 MG tablet Take 1 tablet (20 mg total) by mouth daily. 30 tablet Reita May M, FNP   ondansetron (ZOFRAN-ODT) 4 MG disintegrating tablet Take 1 tablet (4 mg total) by mouth every 8 (eight) hours as needed for nausea or vomiting. 20 tablet Carlisle Beers, FNP      PDMP not reviewed this encounter.   Carlisle Beers, [place] 05/25/23 1030

## 2023-05-27 ENCOUNTER — Other Ambulatory Visit: Payer: Self-pay

## 2023-05-28 ENCOUNTER — Other Ambulatory Visit: Payer: Self-pay

## 2023-06-25 ENCOUNTER — Other Ambulatory Visit: Payer: Self-pay

## 2023-08-05 ENCOUNTER — Other Ambulatory Visit: Payer: Self-pay

## 2023-08-12 ENCOUNTER — Ambulatory Visit (INDEPENDENT_AMBULATORY_CARE_PROVIDER_SITE_OTHER): Payer: Self-pay | Admitting: Primary Care

## 2023-08-12 ENCOUNTER — Other Ambulatory Visit: Payer: Self-pay

## 2023-08-12 ENCOUNTER — Encounter (INDEPENDENT_AMBULATORY_CARE_PROVIDER_SITE_OTHER): Payer: Self-pay | Admitting: Primary Care

## 2023-08-12 VITALS — BP 124/86 | HR 95 | Resp 16 | Wt 238.0 lb

## 2023-08-12 DIAGNOSIS — Z5941 Food insecurity: Secondary | ICD-10-CM

## 2023-08-12 DIAGNOSIS — R519 Headache, unspecified: Secondary | ICD-10-CM

## 2023-08-12 DIAGNOSIS — Z5982 Transportation insecurity: Secondary | ICD-10-CM

## 2023-08-12 DIAGNOSIS — Z131 Encounter for screening for diabetes mellitus: Secondary | ICD-10-CM

## 2023-08-12 DIAGNOSIS — N939 Abnormal uterine and vaginal bleeding, unspecified: Secondary | ICD-10-CM

## 2023-08-12 DIAGNOSIS — E559 Vitamin D deficiency, unspecified: Secondary | ICD-10-CM

## 2023-08-12 DIAGNOSIS — R202 Paresthesia of skin: Secondary | ICD-10-CM

## 2023-08-12 DIAGNOSIS — Z5912 Inadequate housing utilities: Secondary | ICD-10-CM

## 2023-08-12 DIAGNOSIS — R5383 Other fatigue: Secondary | ICD-10-CM

## 2023-08-12 DIAGNOSIS — E7800 Pure hypercholesterolemia, unspecified: Secondary | ICD-10-CM

## 2023-08-12 DIAGNOSIS — F32A Depression, unspecified: Secondary | ICD-10-CM

## 2023-08-12 NOTE — Patient Instructions (Signed)

## 2023-08-12 NOTE — Progress Notes (Signed)
[facility]  Monica Peters, is a 37 y.o. female  NWG:[id number]  VHQ:[id number]  DOB - Dec 02, 1985  Chief Complaint  Patient presents with   Medication Management    Pt has questions in regards to medication on list        Subjective:   Monica Peters is a 37 y.o. female here today for a follow up visit. Patient has No headache, No chest pain, No abdominal pain - No Nausea, No new weakness tingling or numbness, No Cough - shortness of breath HPI Pt still complains of still having headaches and b/l hands are burning and numb. Right hand pain radiates up to elbow and left hand pain radiates up to the shoulder. Pt states pain today is 6 out of 10.   Recently headaches have lasted up to 4 day. Night time felt asthough she was going to faint. No problems updated.  Comprehensive ROS Pertinent positive and negative noted in HPI   No Known Allergies  Past Medical History:  Diagnosis Date   Medical history non-contributory     Current Outpatient Medications on File Prior to Visit  Medication Sig Dispense Refill   Norethindrone Acetate-Ethinyl Estrad-FE (TARINA 24 FE) 1-20 MG-MCG(24) tablet Take 1 tablet by mouth daily. 28 tablet 11   acetaminophen (TYLENOL) 325 MG tablet Take 650 mg by mouth every 6 (six) hours as needed for mild pain. (Patient not taking: Reported on 08/12/2023)     albuterol (VENTOLIN HFA) 108 (90 Base) MCG/ACT inhaler Inhale 2 puffs into the lungs every 6 (six) hours as needed for wheezing or shortness of breath. (Patient not taking: Reported on 08/12/2023) 8 g 0   Cholecalciferol (VITAMIN D3) 50 MCG (2000 UT) capsule Take 1 capsule (2,000 Units total) by mouth daily. 90 capsule 1   ondansetron (ZOFRAN-ODT) 4 MG disintegrating tablet Take 1 tablet (4 mg total) by mouth every 8 (eight) hours as needed for nausea or vomiting. (Patient not taking: Reported on 08/12/2023) 20 tablet 0   pantoprazole (PROTONIX) 20 MG tablet Take 1 tablet  (20 mg total) by mouth daily. 30 tablet 0   No current facility-administered medications on file prior to visit.   Health Maintenance  Topic Date Due   COVID-19 Vaccine (2 - 2024-25 season) 05/02/2023   Pap with HPV screening  04/20/2028   DTaP/Tdap/Td vaccine (2 - Td or Tdap) 10/05/2029   Flu Shot  Completed   Hepatitis C Screening  Completed   HIV Screening  Completed   HPV Vaccine  Aged Out    Objective:   Vitals:   08/12/23 0959  BP: 124/86  Pulse: 95  Resp: 16  SpO2: 99%  Weight: 238 lb (108 kg)   BP Readings from Last 3 Encounters:  08/12/23 124/86  05/25/23 116/82  04/21/23 130/85      Physical Exam Vitals reviewed.  Constitutional:      Appearance: She is obese.  HENT:     Head: Normocephalic.     Right Ear: Tympanic membrane and external ear normal.     Left Ear: Tympanic membrane and external ear normal.  Eyes:     Extraocular Movements: Extraocular movements intact.     Pupils: Pupils are equal, round, and reactive to light.  Cardiovascular:     Rate and Rhythm: Normal rate and regular rhythm.  Pulmonary:     Effort: Pulmonary effort is normal.     Breath sounds: Normal breath sounds.  Abdominal:     General: Bowel sounds are  normal. There is distension.     Palpations: Abdomen is soft.  Musculoskeletal:        General: Normal range of motion.     Cervical back: Normal range of motion and neck supple.  Skin:    General: Skin is warm and dry.  Neurological:     Mental Status: She is oriented to person, place, and time.  Psychiatric:        Mood and Affect: Mood normal.        Behavior: Behavior normal.      Assessment & Plan  Jakari was seen today for medication management.  Diagnoses and all orders for this visit:  Abnormal uterine bleeding (AUB) -     CBC with Differential  Vitamin D deficiency -     Vitamin D, 25-hydroxy  Elevated LDL cholesterol level -     CMP14+EGFR -     Lipid Panel  Fatigue due to depression -     CBC  with Differential -     CMP14+EGFR  Diabetes mellitus screening -     Hemoglobin A1c  Paresthesia of hand, bilateral 2/2 Generalized headaches Nephrology referral      Patient have been counseled extensively about nutrition and exercise. Other issues discussed during this visit include: low cholesterol diet, weight control and daily exercise, foot care, annual eye examinations at Ophthalmology, importance of adherence with medications and regular follow-up. We also discussed long term complications of uncontrolled diabetes and hypertension.   Return in about 3 months (around 11/10/2023).  The patient was given clear instructions to go to ER or return to medical center if symptoms don't improve, worsen or new problems develop. The patient verbalized understanding. The patient was told to call to get lab results if they haven't heard anything in the next week.   This note has been created with Education officer, environmental. Any transcriptional errors are unintentional.   Grayce Sessions, NP 08/12/2023, 10:39 AM

## 2023-08-13 ENCOUNTER — Telehealth: Payer: Self-pay | Admitting: *Deleted

## 2023-08-13 ENCOUNTER — Other Ambulatory Visit: Payer: Self-pay

## 2023-08-13 ENCOUNTER — Other Ambulatory Visit (INDEPENDENT_AMBULATORY_CARE_PROVIDER_SITE_OTHER): Payer: Self-pay | Admitting: Primary Care

## 2023-08-13 DIAGNOSIS — E559 Vitamin D deficiency, unspecified: Secondary | ICD-10-CM

## 2023-08-13 LAB — CMP14+EGFR
ALT: 127 [IU]/L — ABNORMAL HIGH (ref 0–32)
AST: 139 [IU]/L — ABNORMAL HIGH (ref 0–40)
Albumin: 4.3 g/dL (ref 3.9–4.9)
Alkaline Phosphatase: 106 [IU]/L (ref 44–121)
BUN/Creatinine Ratio: 14 (ref 9–23)
BUN: 8 mg/dL (ref 6–20)
Bilirubin Total: 0.3 mg/dL (ref 0.0–1.2)
CO2: 20 mmol/L (ref 20–29)
Calcium: 10.8 mg/dL — ABNORMAL HIGH (ref 8.7–10.2)
Chloride: 104 mmol/L (ref 96–106)
Creatinine, Ser: 0.58 mg/dL (ref 0.57–1.00)
Globulin, Total: 2.7 g/dL (ref 1.5–4.5)
Glucose: 91 mg/dL (ref 70–99)
Potassium: 4 mmol/L (ref 3.5–5.2)
Sodium: 139 mmol/L (ref 134–144)
Total Protein: 7 g/dL (ref 6.0–8.5)
eGFR: 118 mL/min/{1.73_m2} (ref >59)

## 2023-08-13 LAB — CBC WITH DIFFERENTIAL/PLATELET
Basophils Absolute: 0.1 10*3/uL (ref 0.0–0.2)
Basos: 1 %
EOS (ABSOLUTE): 0.1 10*3/uL (ref 0.0–0.4)
Eos: 2 %
Hematocrit: 43.3 % (ref 34.0–46.6)
Hemoglobin: 14.1 g/dL (ref 11.1–15.9)
Immature Grans (Abs): 0 10*3/uL (ref 0.0–0.1)
Immature Granulocytes: 0 %
Lymphocytes Absolute: 2.4 10*3/uL (ref 0.7–3.1)
Lymphs: 43 %
MCH: 30.3 pg (ref 26.6–33.0)
MCHC: 32.6 g/dL (ref 31.5–35.7)
MCV: 93 fL (ref 79–97)
Monocytes Absolute: 0.4 10*3/uL (ref 0.1–0.9)
Monocytes: 8 %
Neutrophils Absolute: 2.6 10*3/uL (ref 1.4–7.0)
Neutrophils: 46 %
Platelets: 231 10*3/uL (ref 150–450)
RBC: 4.65 x10E6/uL (ref 3.77–5.28)
RDW: 12 % (ref 11.7–15.4)
WBC: 5.6 10*3/uL (ref 3.4–10.8)

## 2023-08-13 LAB — LIPID PANEL
Chol/HDL Ratio: 3.3 {ratio} (ref 0.0–4.4)
Cholesterol, Total: 183 mg/dL (ref 100–199)
HDL: 55 mg/dL (ref >39)
LDL Chol Calc (NIH): 109 mg/dL — ABNORMAL HIGH (ref 0–99)
Triglycerides: 103 mg/dL (ref 0–149)
VLDL Cholesterol Cal: 19 mg/dL (ref 5–40)

## 2023-08-13 LAB — VITAMIN D 25 HYDROXY (VIT D DEFICIENCY, FRACTURES): Vit D, 25-Hydroxy: 22.5 ng/mL — ABNORMAL LOW (ref 30.0–100.0)

## 2023-08-13 LAB — HEMOGLOBIN A1C
Est. average glucose Bld gHb Est-mCnc: 120 mg/dL
Hgb A1c MFr Bld: 5.8 % — ABNORMAL HIGH (ref 4.8–5.6)

## 2023-08-13 MED ORDER — ERGOCALCIFEROL 1.25 MG (50000 UT) PO CAPS
50000.0000 [IU] | ORAL_CAPSULE | ORAL | 0 refills | Status: DC
  Filled 2023-08-13: qty 8, 56d supply, fill #0

## 2023-08-13 NOTE — Progress Notes (Signed)
  Care Coordination  Outreach Note  08/13/2023 Name: Saphire Buff MRN: [id number] DOB: [birth year]   Care Coordination Outreach Attempts: An unsuccessful telephone outreach was attempted today to offer the patient information about available care coordination services. Using [employer] ID# [phone] named [place].   Follow Up Plan:  Additional outreach attempts will be made to offer the patient care coordination information and services.   Encounter Outcome:  No Answer  Gwenevere Ghazi  Care Coordination Care Guide  Direct Dial: [phone]

## 2023-08-13 NOTE — Progress Notes (Signed)
  Care Coordination   Note   08/13/2023 Name: Monica Peters MRN: [id number] DOB: 07-03-1986  [name] is a 37 y.o. year old female who sees Grayce Sessions, NP for primary care. I reached out to [name] by phone today to offer care coordination services.Using [employer] QQ#[id number] named Darlina Rumpf.  [name] including:   The Care Coordination services include support from the care team which includes your Nurse Coordinator, Clinical Social Worker, or Pharmacist.  The Care Coordination team is here to help remove barriers to the health concerns and goals most important to you. Care Coordination services are voluntary, and the patient may decline or stop services at any time by request to their care team member.   Care Coordination Consent Status: Patient agreed to services and verbal consent obtained.   Follow up plan:  Telephone appointment with care coordination team member scheduled for:  08/27/23  Encounter Outcome:  Patient Scheduled  [facility] Coordination Care Guide  Direct Dial: [phone]

## 2023-08-24 ENCOUNTER — Other Ambulatory Visit: Payer: Self-pay

## 2023-08-27 ENCOUNTER — Encounter: Payer: Self-pay | Admitting: Neurology

## 2023-08-27 ENCOUNTER — Ambulatory Visit: Payer: Self-pay | Admitting: Licensed Clinical Social Worker

## 2023-08-27 ENCOUNTER — Ambulatory Visit (INDEPENDENT_AMBULATORY_CARE_PROVIDER_SITE_OTHER): Payer: Self-pay | Admitting: Primary Care

## 2023-08-27 NOTE — Patient Outreach (Signed)
  Care Coordination   Initial Visit Note   08/27/2023 Name: Monica Peters MRN: [id number] DOB: [birth year]  Monica Peters is a 37 y.o. year old female who sees Monica Sessions, NP for primary care. I spoke with  Monica Peters by phone today.  What matters to the patients health and wellness today?  Food insecurities and transportation     Goals Addressed             This Visit's Progress    Care Coordination Activities       Care Coordination Interventions: Patient stated that she runs low on food sometimes because she has to buy formula for there child. The patient received $600 a month in SNAP benefits and also receives [facility]. SW educated the patient on how to spend in the grocery store and encouraged the patient to reach out to the [facility] case manager to see if she can receive any extra formula, but the patient stated that her case manager stated that she was receiving the max allowed to get extra. SW will mail out a Environmental manager. Patient stated that her friend take to the doctor when she needs a ride. The SW discussed the SCAT and offered to send information and the patient agreed to receive it and when the SW follows up, the patient will decide it this something that she wants to apply for. SW will follow up on 09/14/2023 at 2:00 pm        SDOH assessments and interventions completed:  Yes  SDOH Interventions Today    Flowsheet Row Most Recent Value  SDOH Interventions   Food Insecurity Interventions Other (Comment)  [SW will mail out Food Pantry resource list]  Housing Interventions Intervention Not Indicated  Transportation Interventions SCAT ([employer])  [SW Will mail rescouces]  Utilities Interventions Intervention Not Indicated        Care Coordination Interventions:  Yes, provided  Interventions Today    Flowsheet Row Most Recent Value  General Interventions   General Interventions  Discussed/Reviewed General Interventions Reviewed  [Food Pantry and SCAT resources will be mailed by the SW]        Follow up plan: Follow up call scheduled for 09/14/2023 at 2:00 pm    Encounter Outcome:  Patient Visit Completed   Monica Cooks, PhD [facility], [facility] Social Worker Direct Dial: [phone]  Fax: [phone]

## 2023-08-27 NOTE — Patient Instructions (Signed)
Visit Information  Thank you for taking time to visit with me today. Please don't hesitate to contact me if I can be of assistance to you.   Following are the goals we discussed today:   Goals Addressed             This Visit's Progress    Care Coordination Activities       Care Coordination Interventions: Patient stated that she runs low on food sometimes because she has to buy formula for there child. The patient received $600 a month in SNAP benefits and also receives [facility]. SW educated the patient on how to spend in the grocery store and encouraged the patient to reach out to the [facility] case manager to see if she can receive any extra formula, but the patient stated that her case manager stated that she was receiving the max allowed to get extra. SW will mail out a Environmental manager. Patient stated that her friend take to the doctor when she needs a ride. The SW discussed the SCAT and offered to send information and the patient agreed to receive it and when the SW follows up, the patient will decide it this something that she wants to apply for. SW will follow up on 09/14/2023 at 2:00 pm        Our next appointment is by telephone on 09/14/2023 at 2:00 pm  Please call the care guide team at [phone] if you need to cancel or reschedule your appointment.   If you are experiencing a Mental Health or Behavioral Health Crisis or need someone to talk to, please call the Suicide and Crisis Lifeline: 988 go to [facility] [address] [place] [phone]) call 911  Patient verbalizes understanding of instructions and care plan provided today and agrees to view in MyChart. Active MyChart status and patient understanding of how to access instructions and care plan via MyChart confirmed with patient.     Jeanie Cooks, PhD [facility], [facility] Social Worker Direct Dial: [phone]  Fax: [phone]

## 2023-08-30 ENCOUNTER — Other Ambulatory Visit: Payer: Self-pay

## 2023-09-09 ENCOUNTER — Other Ambulatory Visit: Payer: Self-pay

## 2023-09-14 ENCOUNTER — Ambulatory Visit: Payer: Self-pay | Admitting: Licensed Clinical Social Worker

## 2023-09-14 NOTE — Patient Outreach (Signed)
  Care Coordination   09/14/2023 Name: [name] MRN: [id number] DOB: 13-Apr-1986   Care Coordination Outreach Attempts:  An unsuccessful outreach was attempted for an appointment today.  Follow Up Plan:  Additional outreach attempts will be made to offer the patient complex care management information and services.   Encounter Outcome:  No Answer   Care Coordination Interventions:  No, not indicated   Tobias CHARM Maranda HEDWIG, PhD [facility], [facility] Social Worker Direct Dial: [phone]  Fax: [phone]

## 2023-10-01 ENCOUNTER — Encounter: Payer: Self-pay | Admitting: Licensed Clinical Social Worker

## 2023-10-04 NOTE — Progress Notes (Signed)
Initial neurology clinic note  Monica Peters MRN: [id number] DOB: 04-21-1986  Referring provider: Grayce Sessions, NP  Primary care provider: Grayce Sessions, NP  Reason for consult:  headaches and bilateral hand pain  Subjective:  This is [name], a 38 y.o. right-handed female with a medical history of vit D deficiency, depression, pre-DM who presents to neurology clinic with headaches, bilateral hand pain, and left foot pain. The patient is alone today. Translation offered but patient declined x2.  Patient has several issues today: 1.-Headaches - This has been present for > 5 years. It is located on the front of her head to back and behind her eyes. She denies neck pain. Sometimes it is only on the right side. She rates the pain as 9/10. It is pressure sensation with occasional throbbing and pulsating. She denies photophobia and nausea, but endorses phonophobia. The headaches can last 1-3 days. It can occur at any time. She has about 4 headaches per week (~16 headaches per month). She can have associated blurry vision but no vision loss. She will lay down when she gets a headache. She thinks it may get a little worse when she lays down. She will take ibuprofen or tylenol when she gets a headache. She gets a little relief. She will take an OTC medication 4 times per week.   She endorses poor sleep due to having a 80 month old baby. She will sleep 5-7 hours but it is broken up. Patient takes oral birth control pill. She is on vit D supplementation.  Caffeine use: 1 cup of coffee per day; 2-3 times a week will have soda Mood: She does endorse some depression and anxiety. She was previously on an antidepressant but stopped when she got pregnant. She does not remember what it was.  2.-Bilateral hand pain - It hurts from the wrist to hands and can go up to the shoulder on the right. It is a burning and numb sensation. She denies clear weakness, but is not  sure. She denies neck pain radiating into arms. This has been present for at least 5 years. 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  3.-Left foot pain - About 3 years ago she noticed pain on the medial aspect of her arch of the left foot. It has since moved to left ankle into her calf. It can be so painful that she cannot walk at time. She has seen ortho and told it was related to her achilles tendon. She was wearing a boot and was taking a pill (but does not know what). She has not followed up with ortho since being pregnant.  She stopping breast feeding 3 months ago. She is a non-smoker. EtOH use: None Restrictive diet: No Family history of neurologic disease including headaches: Grandmother with migraines  MEDICATIONS:  Outpatient Encounter Medications as of 10/14/2023  Medication Sig Note   acetaminophen (TYLENOL) 325 MG tablet Take 650 mg by mouth every 6 (six) hours as needed for mild pain (pain score 1-3).    albuterol (VENTOLIN HFA) 108 (90 Base) MCG/ACT inhaler Inhale 2 puffs into the lungs every 6 (six) hours as needed for wheezing or shortness of breath. 05/08/2021: PRN   Cholecalciferol (VITAMIN D3) 50 MCG (2000 UT) capsule Take 1 capsule (2,000 Units total) by mouth daily.    Norethindrone Acetate-Ethinyl Estrad-FE (TARINA 24 FE) 1-20 MG-MCG(24) tablet Take 1 tablet by mouth daily.    SUMAtriptan (IMITREX) 100 MG tablet Take  1 tablet (100 mg total) by mouth once as needed for up to 1 dose. May repeat in 2 hours if headache persists or recurs.    topiramate (TOPAMAX) 25 MG tablet Take 1 tablet (25 mg total) by mouth at bedtime.    ergocalciferol (VITAMIN D2) 1.25 MG (50000 UT) capsule Take 1 capsule (50,000 Units total) by mouth once a week. (Patient not taking: Reported on 10/14/2023)    ondansetron (ZOFRAN-ODT) 4 MG disintegrating tablet Take 1 tablet (4 mg total) by mouth every 8 (eight) hours as needed for nausea or vomiting. (Patient not taking: Reported on  10/14/2023)    pantoprazole (PROTONIX) 20 MG tablet Take 1 tablet (20 mg total) by mouth daily. (Patient not taking: Reported on 10/14/2023)    No facility-administered encounter medications on file as of 10/14/2023.    PAST MEDICAL HISTORY: Past Medical History:  Diagnosis Date   Medical history non-contributory     PAST SURGICAL HISTORY: Past Surgical History:  Procedure Laterality Date   CHOLECYSTECTOMY  2021    ALLERGIES: No Known Allergies  FAMILY HISTORY: History reviewed. No pertinent family history.  SOCIAL HISTORY: Social History   Tobacco Use   Smoking status: Never   Smokeless tobacco: Never  Vaping Use   Vaping status: Never Used  Substance Use Topics   Alcohol use: Never    Comment: occ   Drug use: Never   Social History   Social History Narrative   Are you right handed or left handed? Right   Are you currently employed ? no   What is your current occupation?   Do you live at home alone?no   Who lives with you? kids   What type of home do you live in: 1 story or 2 story? one        Objective:  Vital Signs:  BP 128/79   Pulse 96   Ht 5' 6.5" (1.689 m)   Wt 239 lb (108.4 kg)   SpO2 99%   BMI 38.00 kg/m   General: No acute distress.  Patient appears well-groomed.   Head:  Normocephalic/atraumatic Eyes:  fundi examined, disc margins clear, no obvious papilledema Neck: supple, no paraspinal tenderness, full range of motion Heart: regular rate and rhythm Lungs: Clear to auscultation bilaterally. Vascular: No carotid bruits.  Neurological Exam: Mental status: alert and oriented, speech fluent and not dysarthric, language intact.  Cranial nerves: CN I: not tested CN II: pupils equal, round and reactive to light, visual fields intact CN III, IV, VI:  full range of motion, no nystagmus, no ptosis CN V: facial sensation intact. CN VII: upper and lower face symmetric CN VIII: hearing intact CN IX, X: uvula midline CN XI: sternocleidomastoid  and trapezius muscles intact CN XII: tongue midline  Bulk & Tone: normal. ?Mild atrophy of bilateral APB Motor:  muscle strength 5/5 throughout except 4+ at bilateral thumb abduction Deep Tendon Reflexes:  2+ throughout.   Sensation:  Pinprick sensation intact. Phalen and Tinel's negative at carpal tunnel Finger to nose testing:  Without dysmetria.   Gait:  Normal station and stride.   Labs and Imaging review: Internal labs: 08/12/23: Vit D: 22.4 Lipid panel: tChol: 183, LDL 109, TG 103 HbA1c: 5.8 CMP significant for AST 139, ALT 127 CBC w/ diff unremarkable  TSH (02/17/22): 1.94  Imaging/Procedures: Sleep study (06/14/20): IMPRESSIONS - No significant obstructive sleep apnea occurred during this study (AHI = 1.0/h). - No significant central sleep apnea occurred during this study (CAI = 0.0/h). -  The patient had minimal or no oxygen desaturation during the study (Min O2 = 91.0%) - The patient snored with moderate snoring volume. - No cardiac abnormalities were noted during this study. - Clinically significant periodic limb movements did not occur during sleep. No significant associated arousals.   DIAGNOSIS - Normal study  Assessment/Plan:  Monica Peters is a 38 y.o. female who presents for evaluation of headaches, hand pain, and left ankle pain. She has a relevant medical history of vit D deficiency, depression, pre-DM. Her neurological examination is pertinent for weakness of bilateral thumb abduction but otherwise normal, including non-dilated fundoscopy.   Patient's most bothersome symptom is her headaches. She has about 4 headaches per week, lasting 1-3 days. They are not responding well to OTC medications which she is taking 4 times per week. She has a family history of migraines and there are some features that sound like migraines. IIH is another possibility, but patient was not eager to do a lumbar puncture to determine her opening pressure. Given that, I will  treat with topamax as it can treat migraines and IIH. I will also give sumatriptan for rescue. She also has medication overuse that is likely contributing to headaches. We discussed cutting back on these today.  For her hand pain, this sounds most consistent with carpal tunnel syndrome. I will get an EMG and recommend wrist braces.  Her left ankle pain sounds orthopaedic in nature as there are no clear neurologic deficits and she has previously been diagnosed with achilles tendon problem per patient.  PLAN: -Blood work: TSH, B12, B1 -Discussed LP, patient deferred -Continue vit D supplementation -For headaches: Prevention:  Topamax 25 mg at bedtime  Migraine rescue: Sumatriptan 100 mg as needed at headache onset, can repeat in 2 hours if needed Limit use of pain relievers to no more than 2 days out of week to prevent risk of rebound or medication-overuse headache. Keep headache diary  For hand pain: -EMG: bilateral upper extremities -Gave information on cock up wrist splints  For left ankle pain - recommend patient follow up with ortho  -Return to clinic in 3 months  The impression above as well as the plan as outlined below were extensively discussed with the patient who voiced understanding. All questions were answered to their satisfaction.  When available, results of the above investigations and possible further recommendations will be communicated to the patient via telephone/MyChart. Patient to call office if not contacted after expected testing turnaround time.   Total time spent reviewing records, interview, history/exam, documentation, and coordination of care on day of encounter:  60 min   Thank you for allowing me to participate in patient's care.  If I can answer any additional questions, I would be pleased to do so.  Jacquelyne Balint, MD   CC: Grayce Sessions, NP [address] [place] [address]  CC: Referring provider: Grayce Sessions, NP [address] [place],  [place] [address]

## 2023-10-05 ENCOUNTER — Ambulatory Visit: Payer: Self-pay | Admitting: Licensed Clinical Social Worker

## 2023-10-05 NOTE — Patient Outreach (Signed)
  Care Coordination   10/05/2023 Name: Kamrynn Melott MRN: [id number] DOB: 18-Nov-1985   Care Coordination Outreach Attempts:  An unsuccessful outreach was attempted for an appointment today.  Follow Up Plan:  No further outreach attempts will be made at this time. We have been unable to contact the patient to offer or enroll patient in complex care management services.  Encounter Outcome:  No Answer   Care Coordination Interventions:  No, not indicated   This was the 3rd attempt to reach the patient, SW will close the case out as of today.   Tobias CHARM Maranda HEDWIG, PhD [facility], [facility] Social Worker Direct Dial: [phone]  Fax: [phone]

## 2023-10-14 ENCOUNTER — Other Ambulatory Visit: Payer: Self-pay

## 2023-10-14 ENCOUNTER — Encounter: Payer: Self-pay | Admitting: Neurology

## 2023-10-14 ENCOUNTER — Ambulatory Visit (INDEPENDENT_AMBULATORY_CARE_PROVIDER_SITE_OTHER): Payer: Self-pay | Admitting: Neurology

## 2023-10-14 VITALS — BP 128/79 | HR 96 | Ht 66.5 in | Wt 239.0 lb

## 2023-10-14 DIAGNOSIS — F40298 Other specified phobia: Secondary | ICD-10-CM

## 2023-10-14 DIAGNOSIS — M79642 Pain in left hand: Secondary | ICD-10-CM

## 2023-10-14 DIAGNOSIS — E559 Vitamin D deficiency, unspecified: Secondary | ICD-10-CM

## 2023-10-14 DIAGNOSIS — M25572 Pain in left ankle and joints of left foot: Secondary | ICD-10-CM

## 2023-10-14 DIAGNOSIS — G8929 Other chronic pain: Secondary | ICD-10-CM

## 2023-10-14 DIAGNOSIS — R202 Paresthesia of skin: Secondary | ICD-10-CM

## 2023-10-14 DIAGNOSIS — M79641 Pain in right hand: Secondary | ICD-10-CM

## 2023-10-14 DIAGNOSIS — G43709 Chronic migraine without aura, not intractable, without status migrainosus: Secondary | ICD-10-CM

## 2023-10-14 MED ORDER — SUMATRIPTAN SUCCINATE 100 MG PO TABS
100.0000 mg | ORAL_TABLET | Freq: Once | ORAL | 5 refills | Status: DC | PRN
  Filled 2023-10-14: qty 9, 30d supply, fill #0

## 2023-10-14 MED ORDER — TOPIRAMATE 25 MG PO TABS
25.0000 mg | ORAL_TABLET | Freq: Every day | ORAL | 5 refills | Status: DC
  Filled 2023-10-14: qty 30, 30d supply, fill #0
  Filled 2023-11-25: qty 30, 30d supply, fill #1
  Filled 2023-12-31: qty 30, 30d supply, fill #2
  Filled 2024-01-27: qty 30, 30d supply, fill #3

## 2023-10-14 NOTE — Progress Notes (Signed)
Monica Peters

## 2023-10-14 NOTE — Patient Instructions (Addendum)
I saw you today for: Headaches - this may be migraines given your family history. You are likely also taking too much tylenol or ibuprofen and that can make your headaches worse. To treat, I will get blood work today and prescribe medication: -For headaches: Prevention:  Topamax 25 mg at bedtime every day even without headaches Migraine rescue: Sumatriptan 100 mg as needed at headache onset, can repeat in 2 hours if needed Limit use of pain relievers to no more than 2 days out of week to prevent risk of rebound or medication-overuse headache. Keep headache diary  Hand pain - I think this is a pinched nerve at your wrist called carpal tunnel syndrome. I am ordering a nerve test to be sure called EMG (see more information below). I am also recommend wrist braces.  Cock up wrist splint for carpal tunnel symptoms can be bought in local drug stores or online. This should especially be worn at night while sleeping.    For your left ankle, I recommend you follow up with orthopaedics.  I will be in touch when I have your results to discuss next steps. I will see you back in clinic in 3 months.  Please let me know if you have any questions or concerns in the meantime.   The physicians and staff at [facility] are committed to providing excellent care. You may receive a survey requesting feedback about your experience at our office. We strive to receive "very good" responses to the survey questions. If you feel that your experience would prevent you from giving the office a "very good " response, please contact our office to try to remedy the situation. We may be reached at [phone]. Thank you for taking the time out of your busy day to complete the survey.  Jacquelyne Balint, MD [facility]  More migraine information: Be aware of common food triggers:  - Caffeine:  coffee, black tea, cola, Mt. Dew  - Chocolate  - Dairy:  aged cheeses (brie, blue, cheddar, gouda, [place], provolone,  [place], Swiss, etc), chocolate milk, buttermilk, sour cream, limit eggs and yogurt  - Nuts, peanut butter  - Alcohol  - Cereals/grains:  FRESH breads (fresh bagels, sourdough, doughnuts), yeast productions  - Processed/canned/aged/cured meats (pre-packaged deli meats, hotdogs)  - MSG/glutamate:  soy sauce, flavor enhancer, pickled/preserved/marinated foods  - Sweeteners:  aspartame (Equal, Nutrasweet).  Sugar and Splenda are okay  - Vegetables:  legumes (lima beans, lentils, snow peas, fava beans, pinto peans, peas, garbanzo beans), sauerkraut, onions, olives, pickles  - Fruit:  avocados, bananas, citrus fruit (orange, lemon, grapefruit), mango  - Other:  Frozen meals, macaroni and cheese Routine exercise Stay adequately hydrated (aim for 64 oz water daily) Keep headache diary Maintain proper stress management Maintain proper sleep hygiene Do not skip meals Consider supplements:  magnesium citrate 400mg  daily, riboflavin 400mg  daily, coenzyme Q10 100mg  three times daily.   ELECTROMYOGRAM AND NERVE CONDUCTION STUDIES (EMG/NCS) INSTRUCTIONS  How to Prepare The neurologist conducting the EMG will need to know if you have certain medical conditions. Tell the neurologist and other EMG lab personnel if you: Have a pacemaker or any other electrical medical device Take blood-thinning medications Have hemophilia, a blood-clotting disorder that causes prolonged bleeding Bathing Take a shower or bath shortly before your exam in order to remove oils from your skin. Don't apply lotions or creams before the exam.  What to Expect You'll likely be asked to change into a hospital gown for the procedure and lie  down on an examination table. The following explanations can help you understand what will happen during the exam.  Electrodes. The neurologist or a technician places surface electrodes at various locations on your skin depending on where you're experiencing symptoms. Or the neurologist may insert  needle electrodes at different sites depending on your symptoms.  Sensations. The electrodes will at times transmit a tiny electrical current that you may feel as a twinge or spasm. The needle electrode may cause discomfort or pain that usually ends shortly after the needle is removed. If you are concerned about discomfort or pain, you may want to talk to the neurologist about taking a short break during the exam.  Instructions. During the needle EMG, the neurologist will assess whether there is any spontaneous electrical activity when the muscle is at rest - activity that isn't present in healthy muscle tissue - and the degree of activity when you slightly contract the muscle.  He or she will give you instructions on resting and contracting a muscle at appropriate times. Depending on what muscles and nerves the neurologist is examining, he or she may ask you to change positions during the exam.  After your EMG You may experience some temporary, minor bruising where the needle electrode was inserted into your muscle. This bruising should fade within several days. If it persists, contact your primary care doctor.

## 2023-10-18 ENCOUNTER — Other Ambulatory Visit: Payer: Self-pay

## 2023-11-10 ENCOUNTER — Ambulatory Visit (INDEPENDENT_AMBULATORY_CARE_PROVIDER_SITE_OTHER): Payer: Self-pay | Admitting: Primary Care

## 2023-11-10 ENCOUNTER — Encounter (INDEPENDENT_AMBULATORY_CARE_PROVIDER_SITE_OTHER): Payer: Self-pay | Admitting: Primary Care

## 2023-11-10 VITALS — BP 138/85 | HR 85 | Wt 233.6 lb

## 2023-11-10 DIAGNOSIS — R748 Abnormal levels of other serum enzymes: Secondary | ICD-10-CM

## 2023-11-10 DIAGNOSIS — K0889 Other specified disorders of teeth and supporting structures: Secondary | ICD-10-CM

## 2023-11-10 NOTE — Progress Notes (Signed)
 Flor # [phone]

## 2023-11-10 NOTE — Progress Notes (Signed)
 [facility]  Monica Peters, is a 38 y.o. female  WGN:[id number]  HQI:[id number]  DOB - 09/30/1985  Chief Complaint  Patient presents with   Medical Management of Chronic Issues       Subjective:   Monica Peters is a 38 y.o. female acute visit. Menstrual cycle calendar states she is 8 days late. She does not miss her birth control and does not feel she is pregnant. Patient has No headache, No chest pain, No abdominal pain - No Nausea, No new weakness tingling or numbness, No Cough - shortness of breath.  Patient complained of right lower tooth painful and sensitive to heat and cold.  Request referral to dentist   No problems updated.  Comprehensive ROS Pertinent positive and negative noted in HPI   No Known Allergies  Past Medical History:  Diagnosis Date   Medical history non-contributory     Current Outpatient Medications on File Prior to Visit  Medication Sig Dispense Refill   acetaminophen (TYLENOL) 325 MG tablet Take 650 mg by mouth every 6 (six) hours as needed for mild pain (pain score 1-3).     albuterol (VENTOLIN HFA) 108 (90 Base) MCG/ACT inhaler Inhale 2 puffs into the lungs every 6 (six) hours as needed for wheezing or shortness of breath. 8 g 0   Cholecalciferol (VITAMIN D3) 50 MCG (2000 UT) capsule Take 1 capsule (2,000 Units total) by mouth daily. 90 capsule 1   ergocalciferol (VITAMIN D2) 1.25 MG (50000 UT) capsule Take 1 capsule (50,000 Units total) by mouth once a week. (Patient not taking: Reported on 10/14/2023) 8 capsule 0   Norethindrone Acetate-Ethinyl Estrad-FE (TARINA 24 FE) 1-20 MG-MCG(24) tablet Take 1 tablet by mouth daily. 28 tablet 11   ondansetron (ZOFRAN-ODT) 4 MG disintegrating tablet Take 1 tablet (4 mg total) by mouth every 8 (eight) hours as needed for nausea or vomiting. (Patient not taking: Reported on 10/14/2023) 20 tablet 0   pantoprazole (PROTONIX) 20 MG tablet Take 1 tablet (20 mg total) by mouth daily.  (Patient not taking: Reported on 10/14/2023) 30 tablet 0   SUMAtriptan (IMITREX) 100 MG tablet Take 1 tablet (100 mg total) by mouth once as needed for up to 1 dose. May repeat in 2 hours if headache persists or recurs. 10 tablet 5   topiramate (TOPAMAX) 25 MG tablet Take 1 tablet (25 mg total) by mouth at bedtime. 30 tablet 5   No current facility-administered medications on file prior to visit.   Health Maintenance  Topic Date Due   Pneumococcal Vaccination (1 of 2 - PCV) Never done   COVID-19 Vaccine (2 - 2024-25 season) 05/02/2023   Pap with HPV screening  04/20/2028   DTaP/Tdap/Td vaccine (2 - Td or Tdap) 10/05/2029   Flu Shot  Completed   Hepatitis C Screening  Completed   HIV Screening  Completed   HPV Vaccine  Aged Out    Objective:   Vitals:   11/10/23 0956  BP: 138/85  Pulse: 85  SpO2: 99%  Weight: 233 lb 9.6 oz (106 kg)   Physical Exam Vitals reviewed.  Constitutional:      Appearance: Normal appearance. She is obese.  HENT:     Head: Normocephalic.     Right Ear: Tympanic membrane, ear canal and external ear normal.     Left Ear: Tympanic membrane, ear canal and external ear normal.     Nose: Nose normal.     Mouth/Throat:     Mouth: Mucous membranes  are moist.  Eyes:     Extraocular Movements: Extraocular movements intact.     Pupils: Pupils are equal, round, and reactive to light.  Cardiovascular:     Rate and Rhythm: Normal rate.  Pulmonary:     Effort: Pulmonary effort is normal.     Breath sounds: Normal breath sounds.  Abdominal:     General: Bowel sounds are normal.     Palpations: Abdomen is soft.  Musculoskeletal:        General: Normal range of motion.     Cervical back: Normal range of motion.  Skin:    General: Skin is warm and dry.  Neurological:     Mental Status: She is alert and oriented to person, place, and time.  Psychiatric:        Mood and Affect: Mood normal.        Behavior: Behavior normal.        Thought Content: Thought  content normal.     Assessment & Plan  Monica Peters was seen today for medical management of chronic issues.  Diagnoses and all orders for this visit:  Elevated liver enzymes -     CMP14+EGFR  Pain, dental -     Ambulatory referral to Dentistry    Patient have been counseled extensively about nutrition and exercise. Other issues discussed during this visit include: low cholesterol diet, weight control and daily exercise, foot care, annual eye examinations at Ophthalmology, importance of adherence with medications and regular follow-up. We also discussed long term complications of uncontrolled diabetes and hypertension.   Return in about 6 months (around 05/12/2024) for medical conditions.  The patient was given clear instructions to go to ER or return to medical center if symptoms don't improve, worsen or new problems develop. The patient verbalized understanding. The patient was told to call to get lab results if they haven't heard anything in the next week.   This note has been created with Education officer, environmental. Any transcriptional errors are unintentional.   Grayce Sessions, NP 11/14/2023, 2:53 PM

## 2023-11-11 LAB — CMP14+EGFR
ALT: 112 IU/L — ABNORMAL HIGH (ref 0–32)
AST: 102 IU/L — ABNORMAL HIGH (ref 0–40)
Albumin: 4.5 g/dL (ref 3.9–4.9)
Alkaline Phosphatase: 128 IU/L — ABNORMAL HIGH (ref 44–121)
BUN/Creatinine Ratio: 16 (ref 9–23)
BUN: 9 mg/dL (ref 6–24)
Bilirubin Total: 0.3 mg/dL (ref 0.0–1.2)
CO2: 16 mmol/L — ABNORMAL LOW (ref 20–29)
Calcium: 10.6 mg/dL — ABNORMAL HIGH (ref 8.7–10.2)
Chloride: 108 mmol/L — ABNORMAL HIGH (ref 96–106)
Creatinine, Ser: 0.56 mg/dL — ABNORMAL LOW (ref 0.57–1.00)
Globulin, Total: 2.6 g/dL (ref 1.5–4.5)
Glucose: 86 mg/dL (ref 70–99)
Potassium: 4.4 mmol/L (ref 3.5–5.2)
Sodium: 139 mmol/L (ref 134–144)
Total Protein: 7.1 g/dL (ref 6.0–8.5)
eGFR: 118 mL/min/{1.73_m2} (ref >59)

## 2023-11-16 ENCOUNTER — Telehealth: Payer: Self-pay | Admitting: Neurology

## 2023-11-16 ENCOUNTER — Ambulatory Visit (INDEPENDENT_AMBULATORY_CARE_PROVIDER_SITE_OTHER): Payer: Self-pay | Admitting: Neurology

## 2023-11-16 DIAGNOSIS — R202 Paresthesia of skin: Secondary | ICD-10-CM

## 2023-11-16 DIAGNOSIS — G5603 Carpal tunnel syndrome, bilateral upper limbs: Secondary | ICD-10-CM

## 2023-11-16 NOTE — Telephone Encounter (Signed)
 Discussed the results of patient's EMG after the procedure today. It showed bilateral carpal tunnel, moderate in degree electrically. Patient's hand symptoms are similar to prior. She has not tried wrist braces. I again recommended she use these at night.  In terms of HA, she has seen improvement.  She will follow up as planned on 02/11/24 and call with worsening of symptoms.  All questions were answered.  Jacquelyne Balint, MD [facility]

## 2023-11-16 NOTE — Procedures (Signed)
 [facility]  [address], [address]  [place], [place] [address] Tel: [phone] Fax: [phone] Test Date:  11/16/2023  Patient: Monica Peters DOB: 08-22-1986 Physician: Jacquelyne Balint, MD  Sex: Female Height: 5' 6.5" Ref Phys: Jacquelyne Balint, MD  ID#: [id number]   Technician:    History: This is a 38 year old female with bilateral hand pain.  NCV & EMG Findings: Extensive electrodiagnostic evaluation of bilateral upper limbs shows: Bilateral median sensory responses show prolonged distal peak latency (L4.7, R7.1 ms) and reduced amplitude (L17, R16 V). Bilateral ulnar and radial sensory responses are within normal limits. Bilateral median (APB) motor responses show prolonged distal onset latency (L5.5, R6.0 ms). Bilateral ulnar (ADM) motor responses are within normal limits. There is no evidence of active or chronic motor axon loss changes affecting any of the tested muscles on needle examination. Motor unit configuration and recruitment pattern is within normal limits.  Impression: This is an abnormal study. The findings are most consistent with the following: Evidence of bilateral median mononeuropathy at or distal to the wrist, consistent with carpal tunnel syndrome, moderate in degree bilaterally. No electrodiagnostic evidence of a right or left cervical (C5-T1) motor radiculopathy. Screening studies for right or left ulnar or radial mononeuropathies are normal.    ___________________________ Jacquelyne Balint, MD    Nerve Conduction Studies Motor Nerve Results    Latency Amplitude F-Lat Segment Distance CV Comment  Site (ms) Norm (mV) Norm (ms)  (cm) (m/s) Norm   Left Median (APB) Motor  Wrist *5.5  < 3.9 6.7  > 6.0        Elbow 10.9 - 6.6 -  Elbow-Wrist 28 52  > 50   Right Median (APB) Motor  Wrist *6.0  < 3.9 6.1  > 6.0        Elbow 11.1 - 5.9 -  Elbow-Wrist 26 51  > 50   Left Ulnar (ADM) Motor  Wrist 1.98  < 3.1 11.8  > 7.0        Bel elbow 5.1  - 11.1 -  Bel elbow-Wrist 20 65  > 50   Ab elbow 6.7 - 10.7 -  Ab elbow-Bel elbow 10 63 -   Right Ulnar (ADM) Motor  Wrist 2.2  < 3.1 14.1  > 7.0        Bel elbow 5.1 - 13.0 -  Bel elbow-Wrist 20.5 71  > 50   Ab elbow 6.6 - 12.9 -  Ab elbow-Bel elbow 10 67 -    Sensory Sites    Neg Peak Lat Amplitude (O-P) Segment Distance Velocity Comment  Site (ms) Norm (V) Norm  (cm) (ms)   Left Median Sensory  Wrist-Dig II *4.7  < 3.4 *17  > 20 Wrist-Dig II 13    Right Median Sensory  Wrist-Dig II *7.1  < 3.4 *16  > 20 Wrist-Dig II 13    Left Radial Sensory  Forearm-Wrist 1.68  < 2.7 47  > 18 Forearm-Wrist 10    Right Radial Sensory  Forearm-Wrist 2.2  < 2.7 36  > 18 Forearm-Wrist 10    Left Ulnar Sensory  Wrist-Dig V 2.5  < 3.1 48  > 12 Wrist-Dig V 11    Right Ulnar Sensory  Wrist-Dig V 2.4  < 3.1 53  > 12 Wrist-Dig V 11     Electromyography   Side Muscle Ins.Act Fibs Fasc Recrt Amp Dur Poly Activation Comment  Right FDI Nml Nml Nml Nml Nml Nml Nml  Nml N/A  Right Pronator teres Nml Nml Nml Nml Nml Nml Nml Nml N/A  Right APB Nml Nml Nml Nml Nml Nml Nml Nml N/A  Right Biceps Nml Nml Nml Nml Nml Nml Nml Nml N/A  Right Triceps Nml Nml Nml Nml Nml Nml Nml Nml N/A  Right Deltoid Nml Nml Nml Nml Nml Nml Nml Nml N/A  Left FDI Nml Nml Nml Nml Nml Nml Nml Nml N/A  Left EIP Nml Nml Nml Nml Nml Nml Nml Nml N/A  Left Pronator teres Nml Nml Nml Nml Nml Nml Nml Nml N/A  Left APB Nml Nml Nml Nml Nml Nml Nml Nml N/A  Left Biceps Nml Nml Nml Nml Nml Nml Nml Nml N/A  Left Triceps Nml Nml Nml Nml Nml Nml Nml Nml N/A  Left Deltoid Nml Nml Nml Nml Nml Nml Nml Nml N/A      Waveforms:  Motor           Sensory

## 2023-11-25 ENCOUNTER — Other Ambulatory Visit: Payer: Self-pay

## 2023-11-26 ENCOUNTER — Other Ambulatory Visit: Payer: Self-pay

## 2023-12-31 ENCOUNTER — Other Ambulatory Visit: Payer: Self-pay

## 2024-01-27 ENCOUNTER — Other Ambulatory Visit: Payer: Self-pay

## 2024-02-04 NOTE — Progress Notes (Signed)
 NEUROLOGY FOLLOW UP OFFICE NOTE  Monica Peters [id number]  Subjective:  Monica Peters is a 38 y.o. year old right-handed female with a medical history of vit D deficiency, depression, pre-DM who we last saw on 10/14/23 for headaches and bilateral hand pain.  To briefly review: 10/14/23: Patient has several issues today: 1.-Headaches - This has been present for > 5 years. It is located on the front of her head to back and behind her eyes. She denies neck pain. Sometimes it is only on the right side. She rates the pain as 9/10. It is pressure sensation with occasional throbbing and pulsating. She denies photophobia and nausea, but endorses phonophobia. The headaches can last 1-3 days. It can occur at any time. She has about 4 headaches per week (~16 headaches per month). She can have associated blurry vision but no vision loss. She will lay down when she gets a headache. She thinks it may get a little worse when she lays down. She will take ibuprofen  or tylenol when she gets a headache. She gets a little relief. She will take an OTC medication 4 times per week.    She endorses poor sleep due to having a 37 month old baby. She will sleep 5-7 hours but it is broken up. Patient takes oral birth control pill. She is on vit D supplementation.   Caffeine use: 1 cup of coffee per day; 2-3 times a week will have soda Mood: She does endorse some depression and anxiety. She was previously on an antidepressant but stopped when she got pregnant. She does not remember what it was.   2.-Bilateral hand pain - It hurts from the wrist to hands and can go up to the shoulder on the right. It is a burning and numb sensation. She denies clear weakness, but is not sure. She denies neck pain radiating into arms. This has been present for at least 5 years. 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   3.-Left foot pain - About 3 years ago she noticed pain on the medial aspect of her arch  of the left foot. It has since moved to left ankle into her calf. It can be so painful that she cannot walk at time. She has seen ortho and told it was related to her achilles tendon. She was wearing a boot and was taking a pill (but does not know what). She has not followed up with ortho since being pregnant.   She stopping breast feeding 3 months ago. She is a non-smoker. EtOH use: None Restrictive diet: No Family history of neurologic disease including headaches: Grandmother with migraines  Most recent Assessment and Plan (10/14/23): Monica Peters is a 38 y.o. female who presents for evaluation of headaches, hand pain, and left ankle pain. She has a relevant medical history of vit D deficiency, depression, pre-DM. Her neurological examination is pertinent for weakness of bilateral thumb abduction but otherwise normal, including non-dilated fundoscopy.    Patient's most bothersome symptom is her headaches. She has about 4 headaches per week, lasting 1-3 days. They are not responding well to OTC medications which she is taking 4 times per week. She has a family history of migraines and there are some features that sound like migraines. IIH is another possibility, but patient was not eager to do a lumbar puncture to determine her opening pressure. Given that, I will treat with topamax  as it can treat migraines and IIH. I will also give  sumatriptan  for rescue. She also has medication overuse that is likely contributing to headaches. We discussed cutting back on these today.   For her hand pain, this sounds most consistent with carpal tunnel syndrome. I will get an EMG and recommend wrist braces.   Her left ankle pain sounds orthopaedic in nature as there are no clear neurologic deficits and she has previously been diagnosed with achilles tendon problem per patient.   PLAN: -Blood work: TSH, B12, B1 -Discussed LP, patient deferred -Continue vit D supplementation -For  headaches: Prevention:  Topamax  25 mg at bedtime  Migraine rescue: Sumatriptan  100 mg as needed at headache onset, can repeat in 2 hours if needed Limit use of pain relievers to no more than 2 days out of week to prevent risk of rebound or medication-overuse headache. Keep headache diary   For hand pain: -EMG: bilateral upper extremities -Gave information on cock up wrist splints   For left ankle pain - recommend patient follow up with ortho  Since their last visit: EMG confirmed bilateral CTS, moderate in degree on both sides. This has not gotten any better.  Her headaches are better. She was only getting a headache once every 2 weeks. The headache is not as severe (4/10). She lost her topamax  so has not taken it for a week. Her headaches have been a little worse since then. Her vision is still sometimes blurry. It may be a little worse since last visit. She has no loss. She has not seen an eye doctor.  Sumatriptan  does not help and makes her feel like her head is about to explode. She will take tylenol which may help a little with the headache.  She did not get her labs drawn as she did not have the money to pay for it.  Patient is still having left ankle pain. She did not follow up with ortho.   MEDICATIONS:  Outpatient Encounter Medications as of 02/11/2024  Medication Sig Note   acetaminophen (TYLENOL) 325 MG tablet Take 650 mg by mouth every 6 (six) hours as needed for mild pain (pain score 1-3).    albuterol  (VENTOLIN  HFA) 108 (90 Base) MCG/ACT inhaler Inhale 2 puffs into the lungs every 6 (six) hours as needed for wheezing or shortness of breath. 05/08/2021: PRN   Cholecalciferol  (VITAMIN D3) 50 MCG (2000 UT) capsule Take 1 capsule (2,000 Units total) by mouth daily.    gabapentin  (NEURONTIN ) 300 MG capsule Take 1 capsule (300 mg total) by mouth 2 (two) times daily.    Norethindrone  Acetate-Ethinyl Estrad-FE (TARINA  24 FE) 1-20 MG-MCG(24) tablet Take 1 tablet by mouth daily.     topiramate  (TOPAMAX ) 25 MG tablet Take 1 tablet (25 mg total) by mouth at bedtime.    [DISCONTINUED] topiramate  (TOPAMAX ) 25 MG tablet Take 1 tablet (25 mg total) by mouth at bedtime.    ondansetron  (ZOFRAN -ODT) 4 MG disintegrating tablet Take 1 tablet (4 mg total) by mouth every 8 (eight) hours as needed for nausea or vomiting. (Patient not taking: Reported on 02/11/2024)    pantoprazole  (PROTONIX ) 20 MG tablet Take 1 tablet (20 mg total) by mouth daily. (Patient not taking: Reported on 02/11/2024)    SUMAtriptan  (IMITREX ) 100 MG tablet Take 1 tablet (100 mg total) by mouth once as needed for up to 1 dose. May repeat in 2 hours if headache persists or recurs. (Patient not taking: Reported on 02/11/2024)    [DISCONTINUED] ergocalciferol  (VITAMIN D2) 1.25 MG (50000 UT) capsule Take 1 capsule (50,000 Units  total) by mouth once a week. (Patient not taking: Reported on 10/14/2023)    No facility-administered encounter medications on file as of 02/11/2024.    PAST MEDICAL HISTORY: Past Medical History:  Diagnosis Date   Medical history non-contributory     PAST SURGICAL HISTORY: Past Surgical History:  Procedure Laterality Date   CHOLECYSTECTOMY  2021    ALLERGIES: No Known Allergies  FAMILY HISTORY: History reviewed. No pertinent family history.  SOCIAL HISTORY: Social History   Tobacco Use   Smoking status: Never   Smokeless tobacco: Never  Vaping Use   Vaping status: Never Used  Substance Use Topics   Alcohol use: Never    Comment: occ   Drug use: Never   Social History   Social History Narrative   Are you right handed or left handed? Right   Are you currently employed ? no   What is your current occupation?   Do you live at home alone?no   Who lives with you? kids   What type of home do you live in: 1 story or 2 story? one          Objective:  Vital Signs:  BP 118/77   Pulse 86   Ht 5' 6.5 (1.689 m)   Wt 234 lb (106.1 kg)   SpO2 98%   BMI 37.20 kg/m    General: No acute distress. She shakes out hands several times during visit due to discomfort. Patient appears well-groomed.   Head:  Normocephalic/atraumatic Eyes:  Fundi examined, disc margins clear. No evidence of papilledema Neck: supple Heart:  Regular rate and rhythm Lungs:  Clear to auscultation bilaterally Back: No paraspinal tenderness Neurological Exam: alert and oriented.  Speech fluent and not dysarthric, language intact.  CN II-XII intact. Bulk and tone normal, muscle strength 5/5 throughout except bilateral APB which is 4+/5.  Sensation to light touch intact.  Deep tendon reflexes 2+ throughout. Gait normal, Romberg negative.   Labs and Imaging review: New results: CMP (11/10/23): unremarkable  EMG (11/16/23): NCV & EMG Findings: Extensive electrodiagnostic evaluation of bilateral upper limbs shows: Bilateral median sensory responses show prolonged distal peak latency (L4.7, R7.1 ms) and reduced amplitude (L17, R16 V). Bilateral ulnar and radial sensory responses are within normal limits. Bilateral median (APB) motor responses show prolonged distal onset latency (L5.5, R6.0 ms). Bilateral ulnar (ADM) motor responses are within normal limits. There is no evidence of active or chronic motor axon loss changes affecting any of the tested muscles on needle examination. Motor unit configuration and recruitment pattern is within normal limits.   Impression: This is an abnormal study. The findings are most consistent with the following: Evidence of bilateral median mononeuropathy at or distal to the wrist, consistent with carpal tunnel syndrome, moderate in degree bilaterally. No electrodiagnostic evidence of a right or left cervical (C5-T1) motor radiculopathy. Screening studies for right or left ulnar or radial mononeuropathies are normal.  Previously reviewed results: 08/12/23: Vit D: 22.4 Lipid panel: tChol: 183, LDL 109, TG 103 HbA1c: 5.8 CMP significant for AST 139, ALT  127 CBC w/ diff unremarkable   TSH (02/17/22): 1.94   Imaging/Procedures: Sleep study (06/14/20): IMPRESSIONS - No significant obstructive sleep apnea occurred during this study (AHI = 1.0/h). - No significant central sleep apnea occurred during this study (CAI = 0.0/h). - The patient had minimal or no oxygen desaturation during the study (Min O2 = 91.0%) - The patient snored with moderate snoring volume. - No cardiac abnormalities were noted during this  study. - Clinically significant periodic limb movements did not occur during sleep. No significant associated arousals.   DIAGNOSIS - Normal study  Assessment/Plan:  [name], a 38 y.o. female with: Headaches - She has a family history of migraines and there are some features that sound like migraines. IIH is another possibility, but patient was not eager to do a lumbar puncture to determine her opening pressure. She significantly improved with topamax , reducing headache frequency to 2 HA per month (from ~16 HA per month). This worsened again when she lost her medication a week ago. It does not appear to have affected her blurry vision. She did not tolerate sumatriptan  and it did not work for her. This is challenging as patient is self pay, but will give samples of Nurtec to see if it helps. It may be too expensive though. Bilateral carpal tunnel syndrome - moderate in degree electrically bilaterally by EMG. She is wearing wrist splints with no significant change in symptoms. Blurry vision - unclear etiology. Could be IIH related, but there is no clear papilledema on my exam and it did not improve with topamax . Could be ophthalmologic issue.  Plan: -For headaches: 1. Prevention: refill Topamax  25 mg at bedtime 2. Rescue: Stop sumatriptan . Patient is self pay, so will give samples of Nurtec 75 mg ODT as needed at headache onset. If she likes it, will try to order and see if can get at reasonable price. 3. Limit use of  pain relievers to no more than 2 days out of week to prevent risk of rebound or medication-overuse headache. 4. Keep headache diary  For bilateral carpal tunnel syndrome: -Hand surgery referral -Continue wrist splints -Start gabapentin  300 mg twice daily  For blurry vision: -Recommended patient see eye doctor as she has not recently  Return to clinic in 6 months  Total time spent reviewing records, interview, history/exam, documentation, and coordination of care on day of encounter:  30 min  Rommie Coats, MD

## 2024-02-11 ENCOUNTER — Encounter: Payer: Self-pay | Admitting: Neurology

## 2024-02-11 ENCOUNTER — Other Ambulatory Visit: Payer: Self-pay

## 2024-02-11 ENCOUNTER — Ambulatory Visit (INDEPENDENT_AMBULATORY_CARE_PROVIDER_SITE_OTHER): Payer: Self-pay | Admitting: Neurology

## 2024-02-11 ENCOUNTER — Telehealth: Payer: Self-pay

## 2024-02-11 VITALS — BP 118/77 | HR 86 | Ht 66.5 in | Wt 234.0 lb

## 2024-02-11 DIAGNOSIS — E559 Vitamin D deficiency, unspecified: Secondary | ICD-10-CM

## 2024-02-11 DIAGNOSIS — G8929 Other chronic pain: Secondary | ICD-10-CM

## 2024-02-11 DIAGNOSIS — G5603 Carpal tunnel syndrome, bilateral upper limbs: Secondary | ICD-10-CM

## 2024-02-11 DIAGNOSIS — H538 Other visual disturbances: Secondary | ICD-10-CM

## 2024-02-11 DIAGNOSIS — M25572 Pain in left ankle and joints of left foot: Secondary | ICD-10-CM

## 2024-02-11 DIAGNOSIS — F40298 Other specified phobia: Secondary | ICD-10-CM

## 2024-02-11 DIAGNOSIS — G43709 Chronic migraine without aura, not intractable, without status migrainosus: Secondary | ICD-10-CM

## 2024-02-11 MED ORDER — TOPIRAMATE 25 MG PO TABS
25.0000 mg | ORAL_TABLET | Freq: Every day | ORAL | 5 refills | Status: AC
  Filled 2024-02-11 – 2024-02-25 (×2): qty 30, 30d supply, fill #0
  Filled 2024-03-30: qty 30, 30d supply, fill #1
  Filled 2024-05-03: qty 30, 30d supply, fill #2
  Filled 2024-06-15: qty 30, 30d supply, fill #3
  Filled 2024-08-01: qty 30, 30d supply, fill #4
  Filled 2024-09-14: qty 30, 30d supply, fill #5

## 2024-02-11 MED ORDER — GABAPENTIN 300 MG PO CAPS
300.0000 mg | ORAL_CAPSULE | Freq: Two times a day (BID) | ORAL | 11 refills | Status: AC
  Filled 2024-02-11: qty 60, 30d supply, fill #0
  Filled 2024-05-03: qty 60, 30d supply, fill #1
  Filled 2024-06-15: qty 60, 30d supply, fill #2
  Filled 2024-08-01: qty 60, 30d supply, fill #3
  Filled 2024-09-14: qty 60, 30d supply, fill #4

## 2024-02-11 NOTE — Telephone Encounter (Signed)
 Medication Samples have been provided to the patient.  Drug name: nurtec       Strength: 75mg         Qty: 4 boxs  LOT: [id number]  Exp.Date: 8/27  Dosing instructions:  1 tablet po as needed for on set of migraine.   The patient has been instructed regarding the correct time, dose, and frequency of taking this medication, including desired effects and most common side effects.   Monica Peters G Gamble Enderle 11:05 AM 02/11/2024

## 2024-02-11 NOTE — Addendum Note (Signed)
 Addended by: Arturo Bing on: 02/11/2024 03:42 PM   Modules accepted: Orders

## 2024-02-11 NOTE — Addendum Note (Signed)
 Addended by: Arturo Bing on: 02/11/2024 02:10 PM   Modules accepted: Orders

## 2024-02-11 NOTE — Addendum Note (Signed)
 Addended by: Arturo Bing on: 02/11/2024 02:12 PM   Modules accepted: Orders

## 2024-02-11 NOTE — Patient Instructions (Addendum)
-  For headaches: 1. Prevention: refill Topamax  25 mg at bedtime 2. Rescue: I will give samples of Nurtec 75 mg as needed at headache onset. If you like it, let me know and I will try to order and see if can get at reasonable price. 3. Limit use of pain relievers to no more than 2 days out of week to prevent risk of rebound or medication-overuse headache. 4. Keep headache diary  For bilateral carpal tunnel syndrome: -Hand surgery referral -Continue wrist splints for now -Start gabapentin  300 mg twice daily  For blurry vision: -Recommended that you see eye doctor to be checked out  Return to clinic in 6 months  The physicians and staff at [facility] are committed to providing excellent care. You may receive a survey requesting feedback about your experience at our office. We strive to receive very good responses to the survey questions. If you feel that your experience would prevent you from giving the office a very good  response, please contact our office to try to remedy the situation. We may be reached at [phone]. Thank you for taking the time out of your busy day to complete the survey.  Rommie Coats, MD [facility]

## 2024-02-14 ENCOUNTER — Telehealth (INDEPENDENT_AMBULATORY_CARE_PROVIDER_SITE_OTHER): Payer: Self-pay

## 2024-02-14 NOTE — Telephone Encounter (Unsigned)
 Copied from CRM [id number]. Topic: Clinical - Medical Advice >> Feb 14, 2024  3:56 PM Monica Peters wrote: Reason for CRM: patient is calling to report that neuroloy stated that there is an interactionRx #: [id number]  topiramate  (TOPAMAX ) 25 MG tablet [[id number]]  and with her birth  control and to reach out to pcp

## 2024-02-20 NOTE — Telephone Encounter (Signed)
 Neurology has refilled her medication

## 2024-02-22 ENCOUNTER — Ambulatory Visit (INDEPENDENT_AMBULATORY_CARE_PROVIDER_SITE_OTHER): Payer: Self-pay | Admitting: Orthopaedic Surgery

## 2024-02-22 DIAGNOSIS — G5601 Carpal tunnel syndrome, right upper limb: Secondary | ICD-10-CM | POA: Insufficient documentation

## 2024-02-22 DIAGNOSIS — G5602 Carpal tunnel syndrome, left upper limb: Secondary | ICD-10-CM | POA: Insufficient documentation

## 2024-02-22 NOTE — Progress Notes (Signed)
 Office Visit Note   Patient: Monica Peters           Date of Birth: [birth year]           MRN: [id number] Visit Date: 02/22/2024              Requested by: Leigh Venetia CROME, MD [address] [place],  [place] [address] PCP: Celestia Rosaline SQUIBB, NP   Assessment & Plan: Visit Diagnoses:  1. Right carpal tunnel syndrome   2. Left carpal tunnel syndrome     Plan: History of Present Illness Monica Peters is a 38 year old female with moderate to severe carpal tunnel syndrome who presents for surgical consultation. She was referred by Dr. Leigh for evaluation of her carpal tunnel syndrome.  She experiences moderate to severe carpal tunnel syndrome in both hands, with the right hand being more symptomatic. Nerve conduction studies confirm the diagnosis. Symptoms include her hands 'falling asleep' when compressed. There is no muscle atrophy or sensory loss in her fingertips. She can make a full fist and oppose her thumb. She has no diabetes.  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 She plans to coordinate her surgery with her mother's availability to help with childcare.  Physical Exam MUSCULOSKELETAL: No muscle atrophy in hands. Full fist made bilaterally. Thumb opposition intact bilaterally. NEUROLOGICAL: Sensation intact in fingertips bilaterally. Positive compression test in hands.  Results DIAGNOSTIC Nerve conduction studies: Moderate to severe carpal tunnel syndrome in both hands  Assessment and Plan Carpal tunnel syndrome, bilateral Moderate to severe bilateral carpal tunnel syndrome, right hand more symptomatic. Recommended surgery to relieve median nerve compression. - Initiate surgical authorization process. - Schedule surgery with Debbie, the surgery scheduler. - Perform carpal tunnel release surgery on one hand at a time. - Detailed surgical plan discussed including risk benefits  prognosis.  Follow-Up Instructions: No follow-ups on file.    Subjective: Chief Complaint  Patient presents with   Other    Bilateral hand pain with numbness and tingling    HPI  Review of Systems  Constitutional: Negative.   HENT: Negative.    Eyes: Negative.   Respiratory: Negative.    Cardiovascular: Negative.   Endocrine: Negative.   Musculoskeletal: Negative.   Neurological: Negative.   Hematological: Negative.   Psychiatric/Behavioral: Negative.    All other systems reviewed and are negative.    Objective: Vital Signs: There were no vitals taken for this visit.  Physical Exam Vitals and nursing note reviewed.  Constitutional:      Appearance: She is well-developed.  HENT:     Head: Atraumatic.     Nose: Nose normal.   Eyes:     Extraocular Movements: Extraocular movements intact.    Cardiovascular:     Pulses: Normal pulses.  Pulmonary:     Effort: Pulmonary effort is normal.  Abdominal:     Palpations: Abdomen is soft.   Musculoskeletal:     Cervical back: Neck supple.   Skin:    General: Skin is warm.     Capillary Refill: Capillary refill takes less than 2 seconds.   Neurological:     Mental Status: She is alert. Mental status is at baseline.   Psychiatric:        Behavior: Behavior normal.        Thought Content: Thought content normal.        Judgment: Judgment normal.  Ortho Exam  Specialty Comments:  No specialty comments available.  Imaging: No results found.   PMFS History: Patient Active Problem List   Diagnosis Date Noted   Abnormal uterine bleeding (AUB) 09/19/2020   Viral upper respiratory tract infection 08/11/2020   Nausea 08/11/2020   Dyspnea 05/14/2020   Past Medical History:  Diagnosis Date   Medical history non-contributory     No family history on file.  Past Surgical History:  Procedure Laterality Date   CHOLECYSTECTOMY  2021   Social History   Occupational History   Not on file  Tobacco  Use   Smoking status: Never   Smokeless tobacco: Never  Vaping Use   Vaping status: Never Used  Substance and Sexual Activity   Alcohol use: Never    Comment: occ   Drug use: Never   Sexual activity: Yes

## 2024-02-25 ENCOUNTER — Other Ambulatory Visit: Payer: Self-pay

## 2024-02-28 ENCOUNTER — Other Ambulatory Visit: Payer: Self-pay

## 2024-03-02 ENCOUNTER — Other Ambulatory Visit: Payer: Self-pay

## 2024-03-09 ENCOUNTER — Encounter (HOSPITAL_BASED_OUTPATIENT_CLINIC_OR_DEPARTMENT_OTHER): Payer: Self-pay | Admitting: Orthopaedic Surgery

## 2024-03-09 ENCOUNTER — Other Ambulatory Visit: Payer: Self-pay

## 2024-03-14 ENCOUNTER — Other Ambulatory Visit: Payer: Self-pay

## 2024-03-14 ENCOUNTER — Other Ambulatory Visit: Payer: Self-pay | Admitting: Physician Assistant

## 2024-03-14 MED ORDER — ONDANSETRON HCL 4 MG PO TABS
4.0000 mg | ORAL_TABLET | Freq: Three times a day (TID) | ORAL | 0 refills | Status: AC | PRN
  Filled 2024-03-14: qty 40, 14d supply, fill #0

## 2024-03-14 MED ORDER — HYDROCODONE-ACETAMINOPHEN 5-325 MG PO TABS
1.0000 | ORAL_TABLET | Freq: Three times a day (TID) | ORAL | 0 refills | Status: AC | PRN
  Filled 2024-03-14: qty 20, 7d supply, fill #0

## 2024-03-15 ENCOUNTER — Ambulatory Visit (HOSPITAL_BASED_OUTPATIENT_CLINIC_OR_DEPARTMENT_OTHER)
Admission: RE | Admit: 2024-03-15 | Discharge: 2024-03-15 | Disposition: A | Discharge status: Home / Self Care | Payer: Self-pay | Attending: Orthopaedic Surgery | Admitting: Orthopaedic Surgery

## 2024-03-15 ENCOUNTER — Encounter (HOSPITAL_BASED_OUTPATIENT_CLINIC_OR_DEPARTMENT_OTHER): Payer: Self-pay | Admitting: Orthopaedic Surgery

## 2024-03-15 ENCOUNTER — Ambulatory Visit (HOSPITAL_BASED_OUTPATIENT_CLINIC_OR_DEPARTMENT_OTHER): Payer: Self-pay | Admitting: Anesthesiology

## 2024-03-15 ENCOUNTER — Other Ambulatory Visit: Payer: Self-pay

## 2024-03-15 ENCOUNTER — Encounter (HOSPITAL_BASED_OUTPATIENT_CLINIC_OR_DEPARTMENT_OTHER)
Admission: RE | Disposition: A | Discharge status: Home / Self Care | Payer: Self-pay | Source: Home / Self Care | Attending: Orthopaedic Surgery

## 2024-03-15 DIAGNOSIS — Z01818 Encounter for other preprocedural examination: Secondary | ICD-10-CM

## 2024-03-15 DIAGNOSIS — G43909 Migraine, unspecified, not intractable, without status migrainosus: Secondary | ICD-10-CM

## 2024-03-15 DIAGNOSIS — G5601 Carpal tunnel syndrome, right upper limb: Secondary | ICD-10-CM

## 2024-03-15 DIAGNOSIS — K219 Gastro-esophageal reflux disease without esophagitis: Secondary | ICD-10-CM | POA: Insufficient documentation

## 2024-03-15 HISTORY — DX: Gastro-esophageal reflux disease without esophagitis: K21.9

## 2024-03-15 HISTORY — PX: CARPAL TUNNEL RELEASE: SHX101

## 2024-03-15 HISTORY — DX: Migraine, unspecified, not intractable, without status migrainosus: G43.909

## 2024-03-15 LAB — POCT PREGNANCY, URINE: Preg Test, Ur: NEGATIVE

## 2024-03-15 SURGERY — CARPAL TUNNEL RELEASE
Anesthesia: Monitor Anesthesia Care | Site: Wrist | Laterality: Right

## 2024-03-15 MED ORDER — ONDANSETRON HCL 4 MG/2ML IJ SOLN
INTRAMUSCULAR | Status: AC
Start: 2024-03-15 — End: 2024-03-15
  Filled 2024-03-15: qty 2

## 2024-03-15 MED ORDER — FENTANYL CITRATE (PF) 100 MCG/2ML IJ SOLN
INTRAMUSCULAR | Status: DC | PRN
  Administered 2024-03-15: 50 ug via INTRAVENOUS

## 2024-03-15 MED ORDER — MIDAZOLAM HCL 5 MG/5ML IJ SOLN
INTRAMUSCULAR | Status: DC | PRN
  Administered 2024-03-15: 2 mg via INTRAVENOUS

## 2024-03-15 MED ORDER — LACTATED RINGERS IV SOLN: INTRAVENOUS | Status: DC

## 2024-03-15 MED ORDER — PROPOFOL 10 MG/ML IV BOLUS
INTRAVENOUS | Status: DC | PRN
Start: 2024-03-15 — End: 2024-03-15
  Administered 2024-03-15: 20 mg via INTRAVENOUS
  Administered 2024-03-15: 40 mg via INTRAVENOUS

## 2024-03-15 MED ORDER — 0.9 % SODIUM CHLORIDE (POUR BTL) OPTIME
TOPICAL | Status: DC | PRN
  Administered 2024-03-15: 1000 mL

## 2024-03-15 MED ORDER — MIDAZOLAM HCL 2 MG/2ML IJ SOLN
INTRAMUSCULAR | Status: AC
  Filled 2024-03-15: qty 2

## 2024-03-15 MED ORDER — OXYCODONE HCL 5 MG/5ML PO SOLN: 5.0000 mg | Freq: Once | ORAL | Status: DC | PRN

## 2024-03-15 MED ORDER — DROPERIDOL 2.5 MG/ML IJ SOLN: 0.6250 mg | Freq: Once | INTRAMUSCULAR | Status: DC | PRN

## 2024-03-15 MED ORDER — ACETAMINOPHEN 500 MG PO TABS
1000.0000 mg | ORAL_TABLET | Freq: Once | ORAL | Status: AC
  Administered 2024-03-15: 1000 mg via ORAL

## 2024-03-15 MED ORDER — CEFAZOLIN SODIUM-DEXTROSE 2-4 GM/100ML-% IV SOLN
INTRAVENOUS | Status: AC
  Filled 2024-03-15: qty 100

## 2024-03-15 MED ORDER — CEFAZOLIN SODIUM-DEXTROSE 2-4 GM/100ML-% IV SOLN
2.0000 g | INTRAVENOUS | Status: AC
  Administered 2024-03-15: 2 g via INTRAVENOUS

## 2024-03-15 MED ORDER — ONDANSETRON HCL 4 MG/2ML IJ SOLN
INTRAMUSCULAR | Status: DC | PRN
  Administered 2024-03-15: 4 mg via INTRAVENOUS

## 2024-03-15 MED ORDER — OXYCODONE HCL 5 MG PO TABS: 5.0000 mg | ORAL_TABLET | Freq: Once | ORAL | Status: DC | PRN

## 2024-03-15 MED ORDER — PROPOFOL 500 MG/50ML IV EMUL
INTRAVENOUS | Status: DC | PRN
  Administered 2024-03-15: 75 ug/kg/min via INTRAVENOUS

## 2024-03-15 MED ORDER — FENTANYL CITRATE (PF) 100 MCG/2ML IJ SOLN: 25.0000 ug | INTRAMUSCULAR | Status: DC | PRN

## 2024-03-15 MED ORDER — LIDOCAINE-EPINEPHRINE (PF) 1 %-1:200000 IJ SOLN
INTRAMUSCULAR | Status: DC | PRN
  Administered 2024-03-15: 10 mL

## 2024-03-15 MED ORDER — ACETAMINOPHEN 500 MG PO TABS
ORAL_TABLET | ORAL | Status: AC
  Filled 2024-03-15: qty 2

## 2024-03-15 MED ORDER — FENTANYL CITRATE (PF) 100 MCG/2ML IJ SOLN
INTRAMUSCULAR | Status: AC
Start: 2024-03-15 — End: 2024-03-15
  Filled 2024-03-15: qty 2

## 2024-03-15 SURGICAL SUPPLY — 40 items
BAND RUBBER #18 3X1/16 STRL (MISCELLANEOUS) ×2 IMPLANT
BLADE MINI RND TIP GREEN BEAV (BLADE) ×1 IMPLANT
BLADE SURG 15 STRL LF DISP TIS (BLADE) ×1 IMPLANT
BNDG COMPR ESMARK 4X3 LF (GAUZE/BANDAGES/DRESSINGS) ×1 IMPLANT
BNDG ELASTIC 3INX 5YD STR LF (GAUZE/BANDAGES/DRESSINGS) ×1 IMPLANT
BRUSH SCRUB EZ PLAIN DRY (MISCELLANEOUS) ×1 IMPLANT
CANISTER SUCT 1200ML W/VALVE (MISCELLANEOUS) ×1 IMPLANT
CORD BIPOLAR FORCEPS 12FT (ELECTRODE) ×1 IMPLANT
COVER BACK TABLE 60X90IN (DRAPES) ×1 IMPLANT
CUFF TOURN SGL QUICK 18X4 (TOURNIQUET CUFF) ×1 IMPLANT
DRAPE EXTREMITY T 121X128X90 (DISPOSABLE) ×1 IMPLANT
DRAPE SURG 17X23 STRL (DRAPES) ×1 IMPLANT
DRAPE U-SHAPE 47X51 STRL (DRAPES) ×1 IMPLANT
DURAPREP 26ML APPLICATOR (WOUND CARE) ×1 IMPLANT
GAUZE SPONGE 4X4 12PLY STRL (GAUZE/BANDAGES/DRESSINGS) ×1 IMPLANT
GAUZE XEROFORM 1X8 LF (GAUZE/BANDAGES/DRESSINGS) ×1 IMPLANT
GLOVE BIOGEL PI IND STRL 7.0 (GLOVE) IMPLANT
GLOVE BIOGEL PI IND STRL 7.5 (GLOVE) ×1 IMPLANT
GLOVE ECLIPSE 7.0 STRL STRAW (GLOVE) ×1 IMPLANT
GLOVE INDICATOR 7.0 STRL GRN (GLOVE) ×1 IMPLANT
GLOVE SURG SYN 7.5 PF PI (GLOVE) ×2 IMPLANT
GOWN STRL REUS W/ TWL LRG LVL3 (GOWN DISPOSABLE) ×1 IMPLANT
GOWN STRL SURGICAL XL XLNG (GOWN DISPOSABLE) ×2 IMPLANT
NDL HYPO 25X1 1.5 SAFETY (NEEDLE) ×1 IMPLANT
NEEDLE HYPO 25X1 1.5 SAFETY (NEEDLE) ×1 IMPLANT
NS IRRIG 1000ML POUR BTL (IV SOLUTION) ×1 IMPLANT
PACK BASIN DAY SURGERY FS (CUSTOM PROCEDURE TRAY) ×1 IMPLANT
PAD CAST 3X4 CTTN HI CHSV (CAST SUPPLIES) ×1 IMPLANT
SHEET MEDIUM DRAPE 40X70 STRL (DRAPES) ×1 IMPLANT
SOL PREP POV-IOD 4OZ 10% (MISCELLANEOUS) ×1 IMPLANT
SOLUTION SCRB POV-IOD 4OZ 7.5% (MISCELLANEOUS) ×1 IMPLANT
SPIKE FLUID TRANSFER (MISCELLANEOUS) IMPLANT
SPONGE T-LAP 18X18 ~~LOC~~+RFID (SPONGE) ×1 IMPLANT
STOCKINETTE 4X48 STRL (DRAPES) ×1 IMPLANT
SUT ETHILON 3 0 PS 1 (SUTURE) ×1 IMPLANT
SYR BULB EAR ULCER 3OZ GRN STR (SYRINGE) ×1 IMPLANT
SYR CONTROL 10ML LL (SYRINGE) IMPLANT
TOWEL GREEN STERILE FF (TOWEL DISPOSABLE) ×1 IMPLANT
TRAY DSU PREP LF (CUSTOM PROCEDURE TRAY) ×1 IMPLANT
UNDERPAD 30X36 HEAVY ABSORB (UNDERPADS AND DIAPERS) ×1 IMPLANT

## 2024-03-15 NOTE — Anesthesia Preprocedure Evaluation (Addendum)
 Anesthesia Evaluation  Patient identified by MRN, date of birth, ID band Patient awake    Reviewed: Allergy & Precautions, NPO status , Patient's Chart, lab work & pertinent test results  History of Anesthesia Complications Negative for: history of anesthetic complications  Airway Mallampati: II  TM Distance: >3 FB Neck ROM: Full    Dental no notable dental hx.    Pulmonary neg pulmonary ROS   Pulmonary exam normal        Cardiovascular negative cardio ROS Normal cardiovascular exam     Neuro/Psych  Headaches    GI/Hepatic Neg liver ROS,GERD  Medicated and Controlled,,  Endo/Other  BMI 37  Renal/GU negative Renal ROS  negative genitourinary   Musculoskeletal negative musculoskeletal ROS (+)    Abdominal   Peds  Hematology negative hematology ROS (+)   Anesthesia Other Findings Carpal tunnel syndrome of right wrist  Reproductive/Obstetrics                              Anesthesia Physical Anesthesia Plan  ASA: 2  Anesthesia Plan: MAC   Post-op Pain Management: Tylenol  PO (pre-op)*   Induction:   PONV Risk Score and Plan: 2 and Treatment may vary due to age or medical condition, Propofol  infusion, Midazolam  and Ondansetron   Airway Management Planned: Natural Airway and Simple Face Mask  Additional Equipment: None  Intra-op Plan:   Post-operative Plan:   Informed Consent: I have reviewed the patients History and Physical, chart, labs and discussed the procedure including the risks, benefits and alternatives for the proposed anesthesia with the patient or authorized representative who has indicated his/her understanding and acceptance.       Plan Discussed with: CRNA  Anesthesia Plan Comments:          Anesthesia Quick Evaluation

## 2024-03-15 NOTE — Anesthesia Procedure Notes (Signed)
 Procedure Name: MAC Date/Time: 03/15/2024 12:32 PM  Performed by: Frost Kayla MATSU, CRNAPre-anesthesia Checklist: Patient identified, Emergency Drugs available, Suction available and Patient being monitored Oxygen Delivery Method: Simple face mask

## 2024-03-15 NOTE — Anesthesia Postprocedure Evaluation (Signed)
 Anesthesia Post Note  Patient: Monica Peters  Procedure(s) Performed: CARPAL TUNNEL RELEASE (Right: Wrist)     Patient location during evaluation: PACU Anesthesia Type: MAC Level of consciousness: awake and alert Pain management: pain level controlled Vital Signs Assessment: post-procedure vital signs reviewed and stable Respiratory status: spontaneous breathing, nonlabored ventilation and respiratory function stable Cardiovascular status: blood pressure returned to baseline Postop Assessment: no apparent nausea or vomiting Anesthetic complications: no   No notable events documented.  Last Vitals:  Vitals:   03/15/24 1330 03/15/24 1345  BP: 116/75 121/73  Pulse: 72   Resp: 12 16  Temp:  (!) 36.3 C  SpO2: 100% 100%    Last Pain:  Vitals:   03/15/24 1345  TempSrc:   PainSc: 0-No pain                 Vertell Row

## 2024-03-15 NOTE — Transfer of Care (Signed)
 Immediate Anesthesia Transfer of Care Note  Patient: Monica Peters  Procedure(s) Performed: CARPAL TUNNEL RELEASE (Right: Wrist)  Patient Location: PACU  Anesthesia Type:MAC  Level of Consciousness: drowsy, patient cooperative, and responds to stimulation  Airway & Oxygen Therapy: Patient Spontanous Breathing and Patient connected to face mask oxygen  Post-op Assessment: Report given to RN and Post -op Vital signs reviewed and stable  Post vital signs: Reviewed and stable  Last Vitals:  Vitals Value Taken Time  BP    Temp    Pulse    Resp    SpO2      Last Pain:  Vitals:   03/15/24 1208  TempSrc: Temporal  PainSc: 2       Patients Stated Pain Goal: 2 (03/15/24 1208)  Complications: No notable events documented.

## 2024-03-15 NOTE — Discharge Instructions (Addendum)
 Postoperative instructions:  Weightbearing instructions: don't lift more than 10 lbs for 4 weeks  Dressing instructions: Keep your dressing and/or splint clean and dry at all times.  It will be removed at your first post-operative appointment.  Your stitches and/or staples will be removed at this visit.  Incision instructions:  Do not soak your incision for 3 weeks after surgery.  If the incision gets wet, pat dry and do not scrub the incision.  Pain control:  You have been given a prescription to be taken as directed for post-operative pain control.  In addition, elevate the operative extremity above the heart at all times to prevent swelling and throbbing pain.  Take over-the-counter Colace, 100mg  by mouth twice a day while taking narcotic pain medications to help prevent constipation.  Follow up appointments: 1) 10 days for suture removal and wound check. 2) Dr. Jerri as scheduled.   -------------------------------------------------------------------------------------------------------------  After Surgery Pain Control:  After your surgery, post-surgical discomfort or pain is likely. This discomfort can last several days to a few weeks. At certain times of the day your discomfort may be more intense.  Did you receive a nerve block?  A nerve block can provide pain relief for one hour to two days after your surgery. As long as the nerve block is working, you will experience little or no sensation in the area the surgeon operated on.  As the nerve block wears off, you will begin to experience pain or discomfort. It is very important that you begin taking your prescribed pain medication before the nerve block fully wears off. Treating your pain at the first sign of the block wearing off will ensure your pain is better controlled and more tolerable when full-sensation returns. Do not wait until the pain is intolerable, as the medicine will be less effective. It is better to treat pain in  advance than to try and catch up.  General Anesthesia:  If you did not receive a nerve block during your surgery, you will need to start taking your pain medication shortly after your surgery and should continue to do so as prescribed by your surgeon.  Pain Medication:  Most commonly we prescribe Vicodin and Percocet for post-operative pain. Both of these medications contain a combination of acetaminophen  (Tylenol ) and a narcotic to help control pain.   It takes between 30 and 45 minutes before pain medication starts to work. It is important to take your medication before your pain level gets too intense.   Nausea is a common side effect of many pain medications. You will want to eat something before taking your pain medicine to help prevent nausea.   If you are taking a prescription pain medication that contains acetaminophen , we recommend that you do not take additional over the counter acetaminophen  (Tylenol ).  Other pain relieving options:   Using a cold pack to ice the affected area a few times a day (15 to 20 minutes at a time) can help to relieve pain, reduce swelling and bruising.   Elevation of the affected area can also help to reduce pain and swelling.  Per [facility] clinic policy, our goal is ensure optimal postoperative pain control with a multimodal pain management strategy. For all [facility] patients, our goal is to wean post-operative narcotic medications by 6 weeks post-operatively. If this is not possible due to utilization of pain medication prior to surgery, your [facility] doctor will support your acute post-operative pain control for the first 6 weeks postoperatively, with a plan  to transition you back to your primary pain team following that. Maralee will work to ensure a Therapist, occupational.   Post Anesthesia Home Care Instructions  Activity: Get plenty of rest for the remainder of the day. A responsible individual must stay with you for 24 hours following the procedure.   For the next 24 hours, DO NOT: -Drive a car -Advertising copywriter -Drink alcoholic beverages -Take any medication unless instructed by your physician -Make any legal decisions or sign important papers.  Meals: Start with liquid foods such as gelatin or soup. Progress to regular foods as tolerated. Avoid greasy, spicy, heavy foods. If nausea and/or vomiting occur, drink only clear liquids until the nausea and/or vomiting subsides. Call your physician if vomiting continues.  Special Instructions/Symptoms: Your throat may feel dry or sore from the anesthesia or the breathing tube placed in your throat during surgery. If this causes discomfort, gargle with warm salt water. The discomfort should disappear within 24 hours.  If you had a scopolamine patch placed behind your ear for the management of post- operative nausea and/or vomiting:  1. The medication in the patch is effective for 72 hours, after which it should be removed.  Wrap patch in a tissue and discard in the trash. Wash hands thoroughly with soap and water. 2. You may remove the patch earlier than 72 hours if you experience unpleasant side effects which may include dry mouth, dizziness or visual disturbances. 3. Avoid touching the patch. Wash your hands with soap and water after contact with the patch.  No tylenol  until after 6:30pm today.

## 2024-03-15 NOTE — Op Note (Signed)
   Carpal tunnel op note  DATE OF SURGERY:03/15/2024  PREOPERATIVE DIAGNOSIS:  Right carpal tunnel syndrome  POSTOPERATIVE DIAGNOSIS: same  PROCEDURE: Right carpal tunnel release. CPT 35278  SURGEON: Kay Ozell Cummins, M.D.  ASSIST: Ronal Morna Grave, [place]  ANESTHESIA:  Local and MAC  TOURNIQUET TIME: less than 20 minutes  BLOOD LOSS: Minimal.  COMPLICATIONS: None.  PATHOLOGY: None.  INDICATIONS: The patient is a 38 y.o. -year-old female who presented with carpal tunnel syndrome failing nonsurgical management, indicated for surgical release.  DESCRIPTION OF PROCEDURE: The patient was identified in the preoperative holding area.  The operative site was marked by the surgeon and confirmed by the patient.  The patient was brought back to the operating room.  MAC anesthesia was administered.  Local anesthetic with epi was injected into the operative site.  A well padded nonsterile tourniquet was placed. The operative extremity was prepped and draped in standard sterile fashion.  A timeout was performed.  Preoperative antibiotics were given.   A palmar incision was made about 5 mm ulnar to the thenar crease.  The palmar aponeurosis was exposed and divided in line with the skin incision. The palmaris brevis was visualized and divided.  The distal edge of the transcarpal ligament was identified. A hemostat was inserted into the carpal tunnel to protect the median nerve and the flexor tendons. Then, the transverse carpal ligament was released under direct visualization. Proximally, a subcutaneous tunnel was made allowing a Sewell retractor to be placed. Then, the distal portion of the antebrachial fascia was released. Distally, all fibrous bands were released. The median nerve was visualized, and the fat pad was exposed. Wound was irrigated and closed with 3-0 nylon sutures. Sterile dressing applied. The patient was transferred to the recovery room in stable condition after all counts were  correct.  POSTOPERATIVE PLAN: To start nerve gliding exercises as tolerated and no heavy lifting for four weeks.  Ozell Cummins, M.D. [facility] 12:51 PM

## 2024-03-15 NOTE — H&P (Signed)
 PREOPERATIVE H&P  Chief Complaint: right carpal tunnel release  HPI: Monica Peters is a 38 y.o. female who presents for surgical treatment of right carpal tunnel release.  She denies any changes in medical history.  Past Surgical History:  Procedure Laterality Date   CHOLECYSTECTOMY  2021   Social History   Socioeconomic History   Marital status: Single    Spouse name: Not on file   Number of children: Not on file   Years of education: Not on file   Highest education level: Not on file  Occupational History   Not on file  Tobacco Use   Smoking status: Never   Smokeless tobacco: Never  Vaping Use   Vaping status: Never Used  Substance and Sexual Activity   Alcohol use: Never    Comment: occ   Drug use: Never   Sexual activity: Yes  Other Topics Concern   Not on file  Social History Narrative   Are you right handed or left handed? Right   Are you currently employed ? no   What is your current occupation?   Do you live at home alone?no   Who lives with you? kids   What type of home do you live in: 1 story or 2 story? one       Social Drivers of Corporate investment banker Strain: Not on file  Food Insecurity: Food Insecurity Present (08/27/2023)   Hunger Vital Sign    Worried About Running Out of Food in the Last Year: Sometimes true    Ran Out of Food in the Last Year: Sometimes true  Transportation Needs: Unmet Transportation Needs (08/27/2023)   PRAPARE - Administrator, Civil Service (Medical): Yes    Lack of Transportation (Non-Medical): Yes  Physical Activity: Not on file  Stress: Not on file  Social Connections: Unknown (01/01/2022)   Received from [facility]   Social Network    Social Network: Not on file   History reviewed. No pertinent family history. No Known Allergies Prior to Admission medications   Medication Sig Start Date End Date Taking? Authorizing Provider  acetaminophen  (TYLENOL ) 325 MG tablet Take 650 mg by  mouth every 6 (six) hours as needed for mild pain (pain score 1-3).   Yes [provider]  Cholecalciferol  (VITAMIN D3) 50 MCG (2000 UT) capsule Take 1 capsule (2,000 Units total) by mouth daily. 04/24/23  Yes Celestia Rosaline SQUIBB, NP  gabapentin  (NEURONTIN ) 300 MG capsule Take 1 capsule (300 mg total) by mouth 2 (two) times daily. 02/11/24  Yes Leigh Venetia CROME, MD  Norethindrone  Acetate-Ethinyl Estrad-FE (TARINA  24 FE) 1-20 MG-MCG(24) tablet Take 1 tablet by mouth daily. 04/27/23  Yes Celestia Rosaline SQUIBB, NP  Rimegepant Sulfate (NURTEC) 75 MG TBDP Take 75 mg by mouth daily as needed.   Yes [provider]  topiramate  (TOPAMAX ) 25 MG tablet Take 1 tablet (25 mg total) by mouth at bedtime. 02/11/24  Yes Leigh Venetia CROME, MD  albuterol  (VENTOLIN  HFA) 108 (90 Base) MCG/ACT inhaler Inhale 2 puffs into the lungs every 6 (six) hours as needed for wheezing or shortness of breath. 04/12/20   Celestia Rosaline SQUIBB, NP  HYDROcodone -acetaminophen  (NORCO/VICODIN) 5-325 MG tablet Take 1 tablet by mouth 3 (three) times daily as needed for moderate pain (pain score 4-6). 03/14/24   Jule Ronal CROME, PA-C  ondansetron  (ZOFRAN ) 4 MG tablet Take 1 tablet (4 mg total) by mouth every 8 (eight) hours as needed for nausea  or vomiting. 03/14/24   Jule Ronal CROME, PA-C  ondansetron  (ZOFRAN -ODT) 4 MG disintegrating tablet Take 1 tablet (4 mg total) by mouth every 8 (eight) hours as needed for nausea or vomiting. Patient not taking: No sig reported 05/25/23   Enedelia Dorna HERO, FNP  pantoprazole  (PROTONIX ) 20 MG tablet Take 1 tablet (20 mg total) by mouth daily. Patient not taking: Reported on 10/14/2023 05/25/23   Enedelia Dorna HERO, FNP     Positive ROS: All other systems have been reviewed and were otherwise negative with the exception of those mentioned in the HPI and as above.  Physical Exam: General: Alert, no acute distress Cardiovascular: No pedal edema Respiratory: No cyanosis, no use of accessory  musculature GI: abdomen soft Skin: No lesions in the area of chief complaint Neurologic: Sensation intact distally Psychiatric: Patient is competent for consent with normal mood and affect Lymphatic: no lymphedema  MUSCULOSKELETAL: exam stable  Assessment: right carpal tunnel release  Plan: Plan for Procedure(s): CARPAL TUNNEL RELEASE  The risks benefits and alternatives were discussed with the patient including but not limited to the risks of nonoperative treatment, versus surgical intervention including infection, bleeding, nerve injury,  blood clots, cardiopulmonary complications, morbidity, mortality, among others, and they were willing to proceed.   Ozell Cummins, MD 03/15/2024 11:50 AM

## 2024-03-16 ENCOUNTER — Telehealth: Payer: Self-pay | Admitting: Orthopaedic Surgery

## 2024-03-16 ENCOUNTER — Encounter (HOSPITAL_BASED_OUTPATIENT_CLINIC_OR_DEPARTMENT_OTHER): Payer: Self-pay | Admitting: Orthopaedic Surgery

## 2024-03-16 NOTE — Telephone Encounter (Signed)
Called and relayed information to patient.

## 2024-03-16 NOTE — Telephone Encounter (Signed)
 Patient called. Says the pain medication isn't helping. Would like something called in for her stronger.

## 2024-03-16 NOTE — Telephone Encounter (Signed)
 Cannot send something stronger.  Can add nsaids if needed

## 2024-03-22 ENCOUNTER — Other Ambulatory Visit: Payer: Self-pay

## 2024-03-22 ENCOUNTER — Encounter: Payer: Self-pay | Admitting: Physician Assistant

## 2024-03-22 ENCOUNTER — Ambulatory Visit (INDEPENDENT_AMBULATORY_CARE_PROVIDER_SITE_OTHER): Payer: Self-pay | Admitting: Physician Assistant

## 2024-03-22 DIAGNOSIS — G5601 Carpal tunnel syndrome, right upper limb: Secondary | ICD-10-CM

## 2024-03-22 DIAGNOSIS — Z9889 Other specified postprocedural states: Secondary | ICD-10-CM

## 2024-03-22 MED ORDER — TRAMADOL HCL 50 MG PO TABS
50.0000 mg | ORAL_TABLET | Freq: Three times a day (TID) | ORAL | 0 refills | Status: AC | PRN
  Filled 2024-03-22: qty 21, 7d supply, fill #0

## 2024-03-22 NOTE — Progress Notes (Signed)
   Post-Op Visit Note   Patient: Monica Peters           Date of Birth: March 24, 1986           MRN: [id number] Visit Date: 03/22/2024 PCP: Celestia Rosaline SQUIBB, NP   Assessment & Plan:  Chief Complaint:  Chief Complaint  Patient presents with   Right Wrist - Follow-up    Right carpal tunnel release 03/15/2024   Visit Diagnoses:  1. Right carpal tunnel syndrome   2. S/P carpal tunnel release     Plan: Patient is a pleasant 38 year old female who comes in today 1 week status post right carpal tunnel release 03/15/2024.  This was for moderate compression of the median nerve.  She has been doing okay.  She has been taking Norco for pain.  She still notes numbness to the thumb, index, long and ring fingertips.  Examination of the right hand reveals a well-healing surgical incision with nylon sutures in place.  No evidence of infection or cellulitis.  Decree sensation to the thumb, index and long fingertips.  Today, her wound was cleaned and recovered.  Velcro splint applied for which she will wear at all times for the next week.  No heavy lifting or submerging her hand underwater for 3 weeks.  Follow-up next week for suture removal.  She may begin nerve gliding exercises at this time.  Call with concerns or questions.  Follow-Up Instructions: Return in about 1 week (around 03/29/2024).   Orders:  No orders of the defined types were placed in this encounter.  Meds ordered this encounter  Medications   traMADol  (ULTRAM ) 50 MG tablet    Sig: Take 1 tablet (50 mg total) by mouth 3 (three) times daily as needed.    Dispense:  21 tablet    Refill:  0    Imaging: No new imaging  PMFS History: Patient Active Problem List   Diagnosis Date Noted   Right carpal tunnel syndrome 02/22/2024   Left carpal tunnel syndrome 02/22/2024   Abnormal uterine bleeding (AUB) 09/19/2020   Viral upper respiratory tract infection 08/11/2020   Nausea 08/11/2020   Dyspnea 05/14/2020   Past Medical  History:  Diagnosis Date   Acid reflux    Migraines     No family history on file.  Past Surgical History:  Procedure Laterality Date   CARPAL TUNNEL RELEASE Right 03/15/2024   Procedure: CARPAL TUNNEL RELEASE;  Surgeon: Jerri Kay HERO, MD;  Location: [facility];  Service: Orthopedics;  Laterality: Right;   CHOLECYSTECTOMY  2021   Social History   Occupational History   Not on file  Tobacco Use   Smoking status: Never   Smokeless tobacco: Never  Vaping Use   Vaping status: Never Used  Substance and Sexual Activity   Alcohol use: Never    Comment: occ   Drug use: Never   Sexual activity: Yes

## 2024-03-23 ENCOUNTER — Telehealth (INDEPENDENT_AMBULATORY_CARE_PROVIDER_SITE_OTHER): Payer: Self-pay

## 2024-03-23 NOTE — Telephone Encounter (Signed)
 Copied from CRM [id number]. Topic: Clinical - Medical Advice >> Feb 14, 2024  3:56 PM Monica Peters wrote: Reason for CRM: patient is calling to report that neuroloy stated that there is an interactionRx #: [id number]  topiramate  (TOPAMAX ) 25 MG tablet [[id number]]  and with her birth  control and to reach out to pcp >> Mar 23, 2024  2:15 PM Monica Peters wrote: Dr. Venetia Potters told Monica Peters that she should not take topiramate  (TOPAMAX ) 25 MG tablet and Norethindrone  Acetate-Ethinyl Estrad-FE (TARINA  24 FE) 1-20 MG-MCG(24) tablet together.  Please call Monica Peters at [phone] to discuss what birth control to take instead of Norethindrone  Acetate-Ethinyl Estrad-FE (TARINA  24 FE) 1-20 MG-MCG(24) tablet.

## 2024-03-24 ENCOUNTER — Other Ambulatory Visit: Payer: Self-pay

## 2024-03-27 NOTE — Telephone Encounter (Signed)
 Will forward to provider

## 2024-03-29 ENCOUNTER — Ambulatory Visit (INDEPENDENT_AMBULATORY_CARE_PROVIDER_SITE_OTHER): Payer: Self-pay | Admitting: Physician Assistant

## 2024-03-29 ENCOUNTER — Other Ambulatory Visit (INDEPENDENT_AMBULATORY_CARE_PROVIDER_SITE_OTHER): Payer: Self-pay | Admitting: Primary Care

## 2024-03-29 ENCOUNTER — Other Ambulatory Visit: Payer: Self-pay

## 2024-03-29 ENCOUNTER — Encounter: Payer: Self-pay | Admitting: Physician Assistant

## 2024-03-29 DIAGNOSIS — Z9889 Other specified postprocedural states: Secondary | ICD-10-CM

## 2024-03-29 NOTE — Progress Notes (Signed)
   Post-Op Visit Note   Patient: Monica Peters           Date of Birth: 04/20/1986           MRN: [id number] Visit Date: 03/29/2024 PCP: Celestia Rosaline SQUIBB, NP   Assessment & Plan:  Chief Complaint:  Chief Complaint  Patient presents with   Right Wrist - Follow-up    Right carpal tunnel release 03/15/2024   Visit Diagnoses:  1. S/P carpal tunnel release     Plan: Patient is a pleasant 38 year old female who comes in today 2 weeks status post right carpal tunnel release 03/15/2024.  She has been doing well.  She still notes some slight paresthesias to the tip of the right thumb.  She has been wearing her Velcro wrist splint.  Examination of the right hand reveals a well healed surgical incision with nylon sutures in place.  No evidence of infection or cellulitis.  Fingers are warm well-perfused.  She has full sensation to all 5 fingers.  Today, sutures were removed and Steri-Strips applied.  No heavy lifting or submerging her hand underwater for 2 weeks.  She will continue with nerve gliding exercises.  Follow-up in 2 weeks for recheck.  Call with concerns or questions.  Follow-Up Instructions: Return in about 2 weeks (around 04/12/2024).   Orders:  No orders of the defined types were placed in this encounter.  No orders of the defined types were placed in this encounter.   Imaging: No new imaging  PMFS History: Patient Active Problem List   Diagnosis Date Noted   Right carpal tunnel syndrome 02/22/2024   Left carpal tunnel syndrome 02/22/2024   Abnormal uterine bleeding (AUB) 09/19/2020   Viral upper respiratory tract infection 08/11/2020   Nausea 08/11/2020   Dyspnea 05/14/2020   Past Medical History:  Diagnosis Date   Acid reflux    Migraines     No family history on file.  Past Surgical History:  Procedure Laterality Date   CARPAL TUNNEL RELEASE Right 03/15/2024   Procedure: CARPAL TUNNEL RELEASE;  Surgeon: Jerri Kay HERO, MD;  Location: [facility];  Service: Orthopedics;  Laterality: Right;   CHOLECYSTECTOMY  2021   Social History   Occupational History   Not on file  Tobacco Use   Smoking status: Never   Smokeless tobacco: Never  Vaping Use   Vaping status: Never Used  Substance and Sexual Activity   Alcohol use: Never    Comment: occ   Drug use: Never   Sexual activity: Yes

## 2024-03-30 ENCOUNTER — Other Ambulatory Visit: Payer: Self-pay

## 2024-03-30 MED ORDER — TARINA 24 FE 1-20 MG-MCG(24) PO TABS
1.0000 | ORAL_TABLET | Freq: Every day | ORAL | 0 refills | Status: DC
Start: 2024-03-30 — End: 2024-06-15
  Filled 2024-03-30 (×2): qty 56, 56d supply, fill #0
  Filled 2024-05-12: qty 28, 28d supply, fill #1

## 2024-03-30 NOTE — Telephone Encounter (Signed)
 Requested Prescriptions  Pending Prescriptions Disp Refills   Norethindrone  Acetate-Ethinyl Estrad-FE (TARINA  24 FE) 1-20 MG-MCG(24) tablet 84 tablet 0    Sig: Take 1 tablet by mouth daily.     OB/GYN:  Contraceptives Passed - 03/30/2024 11:43 AM      Passed - Last BP in normal range    BP Readings from Last 1 Encounters:  03/15/24 121/73         Passed - Valid encounter within last 12 months    Recent Outpatient Visits           4 months ago Elevated liver enzymes   [place] [facility] Celestia Rosaline SQUIBB, NP   7 months ago Abnormal uterine bleeding (AUB)   [place] [facility] Celestia Rosaline SQUIBB, NP   11 months ago Cervical cancer screening   [place] [facility] Celestia Rosaline SQUIBB, NP   1 year ago Pregnancy, unspecified gestational age   [place] [facility] Celestia Rosaline SQUIBB, NP   2 years ago Current mild episode of major depressive disorder, unspecified whether recurrent [facility])    [facility] Celestia Rosaline SQUIBB, NP       Future Appointments             In 1 month Celestia, Rosaline SQUIBB, NP  [facility]            Passed - Patient is not a smoker

## 2024-03-31 ENCOUNTER — Other Ambulatory Visit: Payer: Self-pay

## 2024-04-12 ENCOUNTER — Ambulatory Visit: Payer: Self-pay | Admitting: Orthopaedic Surgery

## 2024-04-12 ENCOUNTER — Other Ambulatory Visit: Payer: Self-pay

## 2024-04-12 DIAGNOSIS — Z9889 Other specified postprocedural states: Secondary | ICD-10-CM | POA: Insufficient documentation

## 2024-04-12 DIAGNOSIS — G5601 Carpal tunnel syndrome, right upper limb: Secondary | ICD-10-CM

## 2024-04-12 MED ORDER — NAPROXEN 500 MG PO TABS
500.0000 mg | ORAL_TABLET | Freq: Two times a day (BID) | ORAL | 3 refills | Status: AC
  Filled 2024-04-12: qty 30, 15d supply, fill #0
  Filled 2024-05-03: qty 30, 15d supply, fill #1
  Filled 2024-06-15 (×2): qty 30, 15d supply, fill #2
  Filled 2024-09-14: qty 30, 15d supply, fill #3

## 2024-04-12 NOTE — Progress Notes (Signed)
   Post-Op Visit Note   Patient: Monica Peters           Date of Birth: 1985-12-05           MRN: [id number] Visit Date: 04/12/2024 PCP: Celestia Rosaline SQUIBB, NP   Assessment & Plan:  Chief Complaint:  Chief Complaint  Patient presents with   Right Hand - Pain    R CTR- 03/15/2024   Visit Diagnoses:  1. S/P carpal tunnel release   2. Right carpal tunnel syndrome     Plan: History of Present Illness [name] is a 38 year old female who presents for a follow-up visit four weeks post-carpal tunnel surgery.  She experiences improvement in carpal tunnel symptoms since the surgery. There is local discomfort around the surgical site, with pain localized to that area. She manages pain with ice and acetaminophen  as needed. She has difficulty moving her fingers and can almost touch her thumb to her fingers. Lifting heavy objects, such as her son, causes a pulling sensation, indicating some functional limitations.  Physical Exam MUSCULOSKELETAL: Scar on hand appears well-healed. Thumb opposition is limited to about 1 cm. Otherwise, hand is normal.  Assessment and Plan Right carpal tunnel syndrome, post-surgical recovery Four weeks post-surgery. Surgical scar healing well. Improved symptoms, pain at surgical site. Mild inflammation expected. Thumb opposition slightly limited, normal for recovery stage. - Switch to anti-inflammatory medication (ibuprofen  or naproxen ). - Send prescription to [facility]. - Advise to report readiness for left carpal tunnel surgery.  Follow-Up Instructions: Return if symptoms worsen or fail to improve.   Orders:  No orders of the defined types were placed in this encounter.  Meds ordered this encounter  Medications   naproxen  (NAPROSYN ) 500 MG tablet    Sig: Take 1 tablet (500 mg total) by mouth 2 (two) times daily with a meal.    Dispense:  30 tablet    Refill:  3    Imaging: No results found.  PMFS  History: Patient Active Problem List   Diagnosis Date Noted   S/P carpal tunnel release 04/12/2024   Right carpal tunnel syndrome 02/22/2024   Left carpal tunnel syndrome 02/22/2024   Abnormal uterine bleeding (AUB) 09/19/2020   Viral upper respiratory tract infection 08/11/2020   Nausea 08/11/2020   Dyspnea 05/14/2020   Past Medical History:  Diagnosis Date   Acid reflux    Migraines     No family history on file.  Past Surgical History:  Procedure Laterality Date   CARPAL TUNNEL RELEASE Right 03/15/2024   Procedure: CARPAL TUNNEL RELEASE;  Surgeon: Jerri Kay HERO, MD;  Location: [facility];  Service: Orthopedics;  Laterality: Right;   CHOLECYSTECTOMY  2021   Social History   Occupational History   Not on file  Tobacco Use   Smoking status: Never   Smokeless tobacco: Never  Vaping Use   Vaping status: Never Used  Substance and Sexual Activity   Alcohol use: Never    Comment: occ   Drug use: Never   Sexual activity: Yes

## 2024-04-13 ENCOUNTER — Ambulatory Visit: Payer: Self-pay | Admitting: Physician Assistant

## 2024-04-13 ENCOUNTER — Other Ambulatory Visit: Payer: Self-pay

## 2024-04-13 ENCOUNTER — Encounter: Payer: Self-pay | Admitting: Physician Assistant

## 2024-04-13 VITALS — BP 131/76 | HR 91

## 2024-04-13 DIAGNOSIS — N3001 Acute cystitis with hematuria: Secondary | ICD-10-CM

## 2024-04-13 LAB — POCT URINALYSIS DIP (CLINITEK)
Bilirubin, UA: NEGATIVE
Glucose, UA: NEGATIVE mg/dL
Ketones, POC UA: NEGATIVE mg/dL
Nitrite, UA: NEGATIVE
POC PROTEIN,UA: NEGATIVE
Spec Grav, UA: 1.02 (ref 1.010–1.025)
Urobilinogen, UA: 1 U/dL
pH, UA: 6.5 (ref 5.0–8.0)

## 2024-04-13 MED ORDER — NITROFURANTOIN MONOHYD MACRO 100 MG PO CAPS
100.0000 mg | ORAL_CAPSULE | Freq: Two times a day (BID) | ORAL | 0 refills | Status: AC
  Filled 2024-04-13: qty 10, 5d supply, fill #0

## 2024-04-13 NOTE — Patient Instructions (Addendum)
 VISIT SUMMARY:  Today, you came in because you were experiencing burning during urination, cramping, and other related symptoms. We discussed your recent surgery and current medications, and reviewed your symptoms in detail.  YOUR PLAN:  -URINARY TRACT INFECTION: A urinary tract infection (UTI) is an infection in any part of your urinary system.  You have been prescribed Macrobid  100 mg to take twice daily for 5 days. Please increase your fluid intake and return if your symptoms persist.  -NAUSEA: Nausea is a feeling of sickness with an inclination to vomit.  Zofran  has been effective in managing this symptom.  Infeccin de las vas urinarias en las mujeres Urinary Tract Infection, Female La infeccin de las vas urinarias (IVU) se presenta en las vas urinarias. Las vas urinarias estn formadas por rganos que producen, Barrister's clerk y eliminan la orina en el cuerpo. Estos rganos incluyen los siguientes: Los riones. Los urteres. La vejiga. La uretra. Cules son las causas? La mayora de las IVU son causadas por grmenes llamados bacterias. Pueden estar dentro o cerca de los genitales. Estos grmenes proliferan y causan hinchazn en las vas urinarias. Qu incrementa el riesgo? Una persona es ms propensa a tener una IVU si: Es mujer. La uretra es ms corta en las mujeres que en los hombres. Tiene colocado un tubo blando llamado catter que drena la orina. No puede controlar cuando orina o defeca. Tiene dificultad para orinar debido a: Un clculo renal. Una obstruccin urinaria. Un trastorno nervioso que afecta la vejiga. No bebe una cantidad suficiente de lquido. Es sexualmente activa. Usa  un mtodo anticonceptivo que se coloca dentro de la vagina, como un espermicida. Est embarazada. Tiene niveles bajos de la hormona estrgeno en el cuerpo. Es Psychologist, sport and exercise. Tambin es ms probable que contraiga una IVU si tiene otros problemas de [place]. Pueden incluir: Diabetes. El  sistema inmunitario debilitado. Su sistema inmunitario es [address] de defensa de su cuerpo. Anemia drepanoctica. Lesin en la columna vertebral. Cules son los signos o sntomas? Entre los sntomas, se pueden incluir los siguientes: Necesidad inmediata de Geographical information systems officer. Hacer poca cantidad de orina con mucha frecuencia. Dolor o ardor al Geographical information systems officer. Sangre en la orina. Orina con mal olor u [employer]. Dolor en la parte inferior de la espalda o en el vientre. Tambin puede: Sentirse confundido. Este puede ser Financial risk analyst sntoma en los adultos [place]. Vomitar. No tener apetito. Cansarse o irritarse con facilidad. Tener fiebre o escalofros. Cmo se diagnostica? Las IVU se diagnostican en funcin de los antecedentes mdicos y de Nurse, learning disability. Tambin pueden hacerle otros estudios. Pueden incluir: Anlisis de comoros. Anlisis de [place]. Pruebas de infecciones de transmisin sexual (ITS). Si ha tenido ms de una IVU, es posible que deba hacerse estudios de diagnstico por imgenes para Financial risk analyst por qu sigue contrayndolas. Cmo se trata? Una IVU puede tratarse de las siguientes maneras: Tomar antibiticos u otros medicamentos. Beber suficiente lquido como para Pharmacologist la orina de color amarillo plido. En casos poco frecuentes, una IVU puede causar una afeccin muy grave llamada sepsis. Es posible que la sepsis deba recibir Pharmacist, hospital hospital. Siga estas instrucciones en su casa: Medicamentos Tome los medicamentos solamente como se lo haya indicado el mdico. Si le dieron antibiticos, tmelos o selos como se lo haya indicado el mdico. No deje de tomarlos aunque comience a sentirse mejor. Instrucciones generales Asegrese de hacer lo siguiente: Orine con frecuencia y vace la vejiga por completo. No contenga la [employer]. Lmpiese de [employer] atrs  despus de Automotive engineer. Use cada trozo de papel higinico una sola vez cuando se limpie. Orine despus de  [employer]. No se haga duchas vaginales ni use aerosoles o talcos en la zona genital. Comunquese con un mdico si: Sus sntomas no han mejorado despus de 1 o 2 das de tratamiento con antibiticos. Los sntomas desaparecen y luego reaparecen. Tiene fiebre o escalofros. Vomita o tiene ganas de vomitar. Solicite ayuda de inmediato si: Tiene dolor muy intenso en la espalda o la parte baja del vientre. Se desmaya. Esta informacin no tiene Theme park manager el consejo del mdico. Asegrese de hacerle al mdico cualquier pregunta que tenga. Document Revised: 04/23/2023 Document Reviewed: 04/23/2023 Elsevier Patient Education  2025 [employer].

## 2024-04-13 NOTE — Progress Notes (Signed)
 Established Patient Office Visit  Subjective   Patient ID: Monica Peters, female    DOB: 03-10-1986  Age: 38 y.o. MRN: [id number]  Chief Complaint  Patient presents with   Urinary Tract Infection   Discussed the use of AI scribe software for clinical note transcription with the patient, who gave verbal consent to proceed.  History of Present Illness   Monica Peters is a 38 year old female who presents with burning during urination and cramping.  She experiences burning during urination and suprapubic cramping since yesterday, with urgency and difficulty emptying her bladder. She denies any concern for sexually transmitted diseases. Drinking water with lemon juice offers some relief.  She underwent recent surgery for carpal tunnel release. She has stomach pain and intermittent diarrhea for three to four days. Nausea persists for over a week, managed with Zofran . No vomiting occurs.  Her last menstrual cycle was two weeks ago, and she has no known medication allergies.        Past Medical History:  Diagnosis Date   Acid reflux    Migraines    Social History   Socioeconomic History   Marital status: Single    Spouse name: Not on file   Number of children: Not on file   Years of education: Not on file   Highest education level: Not on file  Occupational History   Not on file  Tobacco Use   Smoking status: Never   Smokeless tobacco: Never  Vaping Use   Vaping status: Never Used  Substance and Sexual Activity   Alcohol use: Never    Comment: occ   Drug use: Never   Sexual activity: Yes  Other Topics Concern   Not on file  Social History Narrative   Are you right handed or left handed? Right   Are you currently employed ? no   What is your current occupation?   Do you live at home alone?no   Who lives with you? kids   What type of home do you live in: 1 story or 2 story? one       Social Drivers of Corporate investment banker Strain: Not  on file  Food Insecurity: Food Insecurity Present (08/27/2023)   Hunger Vital Sign    Worried About Running Out of Food in the Last Year: Sometimes true    Ran Out of Food in the Last Year: Sometimes true  Transportation Needs: Unmet Transportation Needs (08/27/2023)   PRAPARE - Administrator, Civil Service (Medical): Yes    Lack of Transportation (Non-Medical): Yes  Physical Activity: Not on file  Stress: Not on file  Social Connections: Unknown (01/01/2022)   Received from [facility]   Social Network    Social Network: Not on file  Intimate Partner Violence: Not At Risk (08/27/2023)   Humiliation, Afraid, Rape, and Kick questionnaire    Fear of Current or Ex-Partner: No    Emotionally Abused: No    Physically Abused: No    Sexually Abused: No   History reviewed. No pertinent family history. No Known Allergies  Review of Systems  Constitutional:  Negative for chills and fever.  HENT: Negative.    Eyes: Negative.   Respiratory:  Negative for shortness of breath.   Cardiovascular:  Negative for chest pain.  Gastrointestinal:  Positive for abdominal pain, diarrhea and nausea. Negative for vomiting.  Genitourinary:  Positive for dysuria and urgency.  Musculoskeletal:  Negative for back pain.  Skin:  Negative.   Neurological: Negative.   Endo/Heme/Allergies: Negative.   Psychiatric/Behavioral: Negative.        Objective:     BP 131/76 (BP Location: Left Arm, Patient Position: Sitting, Cuff Size: Large)   Pulse 91   LMP 02/29/2024 (Approximate)   SpO2 98%  BP Readings from Last 3 Encounters:  04/13/24 131/76  03/15/24 121/73  02/11/24 118/77   Wt Readings from Last 3 Encounters:  03/15/24 235 lb 14.3 oz (107 kg)  02/11/24 234 lb (106.1 kg)  11/10/23 233 lb 9.6 oz (106 kg)    Physical Exam Constitutional:      Appearance: Normal appearance.  HENT:     Head: Normocephalic and atraumatic.     Right Ear: External ear normal.     Left Ear: External  ear normal.     Nose: Nose normal.     Mouth/Throat:     Mouth: Mucous membranes are moist.     Pharynx: Oropharynx is clear.  Eyes:     Extraocular Movements: Extraocular movements intact.     Conjunctiva/sclera: Conjunctivae normal.     Pupils: Pupils are equal, round, and reactive to light.  Cardiovascular:     Rate and Rhythm: Normal rate and regular rhythm.     Pulses: Normal pulses.     Heart sounds: Normal heart sounds.  Pulmonary:     Effort: Pulmonary effort is normal.     Breath sounds: Normal breath sounds.  Abdominal:     Tenderness: There is abdominal tenderness in the suprapubic area. There is right CVA tenderness and left CVA tenderness.  Musculoskeletal:        General: Normal range of motion.     Cervical back: Normal range of motion and neck supple.  Skin:    General: Skin is warm and dry.  Neurological:     General: No focal deficit present.     Mental Status: She is alert and oriented to person, place, and time.  Psychiatric:        Mood and Affect: Mood normal.        Behavior: Behavior normal.        Thought Content: Thought content normal.        Judgment: Judgment normal.        Assessment & Plan:   Problem List Items Addressed This Visit   None Visit Diagnoses       Acute cystitis with hematuria    -  Primary   Relevant Medications   nitrofurantoin, macrocrystal-monohydrate, (MACROBID) 100 MG capsule   Other Relevant Orders   POCT URINALYSIS DIP (CLINITEK) (Completed)     1. Acute cystitis with hematuria (Primary) UA positive for trace blood, trace leukocytes.  Reasonable to treat for urinary tract infection based on clinical presentation.  Unable to send urine for culture due to lack of nursing staff on mobile unit today.  Patient understands this and does agree.  Declines any type STI testing at this time.  Patient education given on supportive care, trial Macrobid, red flags given for prompt reevaluation. - nitrofurantoin,  macrocrystal-monohydrate, (MACROBID) 100 MG capsule; Take 1 capsule (100 mg total) by mouth 2 (two) times daily for 5 days.  Dispense: 10 capsule; Refill: 0 - POCT URINALYSIS DIP (CLINITEK)   I have reviewed the patient's medical history (PMH, PSH, Social History, Family History, Medications, and allergies) , and have been updated if relevant. I spent 30 minutes reviewing chart and  face to face time with patient.  Return if symptoms worsen or fail to improve.    Kirk RAMAN Mayers, PA-C

## 2024-04-14 ENCOUNTER — Other Ambulatory Visit: Payer: Self-pay

## 2024-05-03 ENCOUNTER — Other Ambulatory Visit: Payer: Self-pay

## 2024-05-05 ENCOUNTER — Other Ambulatory Visit: Payer: Self-pay

## 2024-05-08 ENCOUNTER — Other Ambulatory Visit: Payer: Self-pay

## 2024-05-12 ENCOUNTER — Encounter (INDEPENDENT_AMBULATORY_CARE_PROVIDER_SITE_OTHER): Payer: Self-pay | Admitting: Primary Care

## 2024-05-12 ENCOUNTER — Other Ambulatory Visit: Payer: Self-pay

## 2024-05-12 ENCOUNTER — Ambulatory Visit (INDEPENDENT_AMBULATORY_CARE_PROVIDER_SITE_OTHER): Payer: Self-pay | Admitting: Primary Care

## 2024-05-12 VITALS — BP 136/84 | HR 72 | Wt 235.0 lb

## 2024-05-12 DIAGNOSIS — R5383 Other fatigue: Secondary | ICD-10-CM

## 2024-05-12 DIAGNOSIS — N939 Abnormal uterine and vaginal bleeding, unspecified: Secondary | ICD-10-CM

## 2024-05-12 DIAGNOSIS — F32A Depression, unspecified: Secondary | ICD-10-CM

## 2024-05-12 DIAGNOSIS — N898 Other specified noninflammatory disorders of vagina: Secondary | ICD-10-CM

## 2024-05-12 MED ORDER — BUSPIRONE HCL 5 MG PO TABS
5.0000 mg | ORAL_TABLET | Freq: Two times a day (BID) | ORAL | 0 refills | Status: AC
  Filled 2024-05-12: qty 60, 30d supply, fill #0

## 2024-05-12 NOTE — Progress Notes (Signed)
 [facility]  Angalena Cousineau Floyce, is a 38 y.o. female  RDW:[id number]  FMW:[id number]  DOB - Mar 24, 1986  Chief Complaint  Patient presents with   Medical Management of Chronic Issues   Menstrual Problem    Irregular periods    Depression    Was on medication before Pregnancy but would like to get back on medication         Subjective:   Birgitta Uhlir is a 38 y.o. female here today for an acute visit.  Depression      The patient presents with depression.  This is a recurrent problem.  The current episode started 1 to 4 weeks ago.   The onset quality is gradual.   The problem occurs constantly.  The most recent episode lasted 6 weeks.    The problem has been waxing and waning since onset.  Associated symptoms include fatigue, insomnia and irritable.     The symptoms are aggravated by family issues.  Past treatments include nothing.  Past compliance problems: stop pregnant.  Previous treatment provided moderate relief.  Risk factors include stress.   Past medical history includes depression.     No problems updated.  Comprehensive ROS Pertinent positive and negative noted in HPI   No Known Allergies  Past Medical History:  Diagnosis Date   Acid reflux    Migraines     Current Outpatient Medications on File Prior to Visit  Medication Sig Dispense Refill   gabapentin  (NEURONTIN ) 300 MG capsule Take 1 capsule (300 mg total) by mouth 2 (two) times daily. 60 capsule 11   naproxen  (NAPROSYN ) 500 MG tablet Take 1 tablet (500 mg total) by mouth 2 (two) times daily with a meal. 30 tablet 3   Norethindrone  Acetate-Ethinyl Estrad-FE (TARINA  24 FE) 1-20 MG-MCG(24) tablet Take 1 tablet by mouth daily. 84 tablet 0   topiramate  (TOPAMAX ) 25 MG tablet Take 1 tablet (25 mg total) by mouth at bedtime. 30 tablet 5   acetaminophen  (TYLENOL ) 325 MG tablet Take 650 mg by mouth every 6 (six) hours as needed for mild pain (pain score 1-3).     albuterol  (VENTOLIN   HFA) 108 (90 Base) MCG/ACT inhaler Inhale 2 puffs into the lungs every 6 (six) hours as needed for wheezing or shortness of breath. 8 g 0   Cholecalciferol  (VITAMIN D3) 50 MCG (2000 UT) capsule Take 1 capsule (2,000 Units total) by mouth daily. 90 capsule 1   HYDROcodone -acetaminophen  (NORCO/VICODIN) 5-325 MG tablet Take 1 tablet by mouth 3 (three) times daily as needed for moderate pain (pain score 4-6). 20 tablet 0   ondansetron  (ZOFRAN ) 4 MG tablet Take 1 tablet (4 mg total) by mouth every 8 (eight) hours as needed for nausea or vomiting. 40 tablet 0   ondansetron  (ZOFRAN -ODT) 4 MG disintegrating tablet Take 1 tablet (4 mg total) by mouth every 8 (eight) hours as needed for nausea or vomiting. (Patient not taking: No sig reported) 20 tablet 0   pantoprazole  (PROTONIX ) 20 MG tablet Take 1 tablet (20 mg total) by mouth daily. (Patient not taking: Reported on 10/14/2023) 30 tablet 0   Rimegepant Sulfate (NURTEC) 75 MG TBDP Take 75 mg by mouth daily as needed.     traMADol  (ULTRAM ) 50 MG tablet Take 1 tablet (50 mg total) by mouth 3 (three) times daily as needed. 21 tablet 0   No current facility-administered medications on file prior to visit.   Health Maintenance  Topic Date Due   Pneumococcal Vaccine (1 of  2 - PCV) Never done   Hepatitis B Vaccine (1 of 3 - 19+ 3-dose series) Never done   HPV Vaccine (1 - 3-dose SCDM series) Never done   Breast Cancer Screening  Never done   Flu Shot  03/31/2024   COVID-19 Vaccine (2 - 2025-26 season) 05/01/2024   Pap with HPV screening  04/20/2028   DTaP/Tdap/Td vaccine (2 - Td or Tdap) 10/05/2029   Hepatitis C Screening  Completed   HIV Screening  Completed   Meningitis B Vaccine  Aged Out    Objective:   Vitals:   05/12/24 1044  Pulse: 72  SpO2: 100%  Weight: 235 lb (106.6 kg)   Physical Exam Vitals reviewed.  Constitutional:      General: She is irritable.     Appearance: Normal appearance. She is obese.  HENT:     Head: Normocephalic.      Right Ear: Tympanic membrane, ear canal and external ear normal.     Left Ear: Tympanic membrane, ear canal and external ear normal.     Nose: Nose normal.     Mouth/Throat:     Mouth: Mucous membranes are moist.  Eyes:     Extraocular Movements: Extraocular movements intact.     Pupils: Pupils are equal, round, and reactive to light.  Cardiovascular:     Rate and Rhythm: Normal rate.  Pulmonary:     Effort: Pulmonary effort is normal.     Breath sounds: Normal breath sounds.  Abdominal:     General: Bowel sounds are normal.     Palpations: Abdomen is soft.  Musculoskeletal:        General: Normal range of motion.     Cervical back: Normal range of motion.  Skin:    General: Skin is warm and dry.  Neurological:     Mental Status: She is alert and oriented to person, place, and time.  Psychiatric:        Mood and Affect: Mood normal.        Behavior: Behavior normal.        Thought Content: Thought content normal.     Assessment & Plan  Genavieve was seen today for medical management of chronic issues, menstrual problem and depression.  Diagnoses and all orders for this visit:  Abnormal uterine bleeding (AUB) March no menstrual cycle.  April-2 days.  May and June no menstrual cycle.  July 1 through 6, August 1 to 4 and September awaiting 12 days late With vaginal d/c   Fatigue due to depression -     busPIRone  (BUSPAR ) 5 MG tablet; Take 1 tablet (5 mg total) by mouth 2 (two) times daily.  Vaginal discharge -     Cervicovaginal ancillary only       Patient have been counseled extensively about nutrition and exercise. Other issues discussed during this visit include: low cholesterol diet, weight control and daily exercise, foot care, annual eye examinations at Ophthalmology, importance of adherence with medications and regular follow-up. We also discussed long term complications of uncontrolled diabetes and hypertension.   Return in about 2 months (around 07/12/2024) for  medication f/u.  The patient was given clear instructions to go to ER or return to medical center if symptoms don't improve, worsen or new problems develop. The patient verbalized understanding. The patient was told to call to get lab results if they haven't heard anything in the next week.   This note has been created with Teaching laboratory technician and smart  Lobbyist. Any transcriptional errors are unintentional.   [name], NP 05/12/2024, 11:21 AM

## 2024-05-15 ENCOUNTER — Other Ambulatory Visit: Payer: Self-pay

## 2024-06-09 ENCOUNTER — Other Ambulatory Visit: Payer: Self-pay | Admitting: Obstetrics and Gynecology

## 2024-06-09 DIAGNOSIS — Z1231 Encounter for screening mammogram for malignant neoplasm of breast: Secondary | ICD-10-CM

## 2024-06-15 ENCOUNTER — Other Ambulatory Visit: Payer: Self-pay

## 2024-06-15 ENCOUNTER — Other Ambulatory Visit (INDEPENDENT_AMBULATORY_CARE_PROVIDER_SITE_OTHER): Payer: Self-pay | Admitting: Primary Care

## 2024-06-16 ENCOUNTER — Other Ambulatory Visit: Payer: Self-pay

## 2024-06-16 NOTE — Telephone Encounter (Signed)
 Will forward to provider

## 2024-06-19 MED ORDER — TARINA 24 FE 1-20 MG-MCG(24) PO TABS
1.0000 | ORAL_TABLET | Freq: Every day | ORAL | 0 refills | Status: DC
  Filled 2024-06-19: qty 84, 84d supply, fill #0

## 2024-06-20 ENCOUNTER — Other Ambulatory Visit: Payer: Self-pay

## 2024-06-22 ENCOUNTER — Telehealth (INDEPENDENT_AMBULATORY_CARE_PROVIDER_SITE_OTHER): Payer: Self-pay

## 2024-06-22 NOTE — Telephone Encounter (Signed)
 Received a call from [facility] in Cytology that she received a specimen with an order but there was no label on specimen.  Patient will need to redo swab

## 2024-06-22 NOTE — Telephone Encounter (Signed)
 Please reach out and schedule a nurse visit to do a reswab

## 2024-06-23 ENCOUNTER — Other Ambulatory Visit: Payer: Self-pay

## 2024-06-30 ENCOUNTER — Other Ambulatory Visit (HOSPITAL_COMMUNITY)
Admission: RE | Admit: 2024-06-30 | Discharge: 2024-06-30 | Disposition: A | Discharge status: Home / Self Care | Payer: Self-pay | Source: Ambulatory Visit | Attending: Primary Care | Admitting: Primary Care

## 2024-06-30 ENCOUNTER — Ambulatory Visit (INDEPENDENT_AMBULATORY_CARE_PROVIDER_SITE_OTHER): Payer: Self-pay

## 2024-06-30 ENCOUNTER — Other Ambulatory Visit (INDEPENDENT_AMBULATORY_CARE_PROVIDER_SITE_OTHER): Payer: Self-pay | Admitting: Primary Care

## 2024-06-30 DIAGNOSIS — N898 Other specified noninflammatory disorders of vagina: Secondary | ICD-10-CM

## 2024-06-30 DIAGNOSIS — E559 Vitamin D deficiency, unspecified: Secondary | ICD-10-CM

## 2024-06-30 DIAGNOSIS — R7303 Prediabetes: Secondary | ICD-10-CM

## 2024-06-30 DIAGNOSIS — E7800 Pure hypercholesterolemia, unspecified: Secondary | ICD-10-CM

## 2024-06-30 NOTE — Progress Notes (Signed)
 Pt came into the office today a re-swab and lab work

## 2024-07-01 LAB — CBC WITH DIFFERENTIAL/PLATELET
Basophils Absolute: 0 x10E3/uL (ref 0.0–0.2)
Basos: 1 %
EOS (ABSOLUTE): 0.1 x10E3/uL (ref 0.0–0.4)
Eos: 2 %
Hematocrit: 39.5 % (ref 34.0–46.6)
Hemoglobin: 12.9 g/dL (ref 11.1–15.9)
Immature Grans (Abs): 0 x10E3/uL (ref 0.0–0.1)
Immature Granulocytes: 0 %
Lymphocytes Absolute: 2.1 x10E3/uL (ref 0.7–3.1)
Lymphs: 46 %
MCH: 30.8 pg (ref 26.6–33.0)
MCHC: 32.7 g/dL (ref 31.5–35.7)
MCV: 94 fL (ref 79–97)
Monocytes Absolute: 0.2 x10E3/uL (ref 0.1–0.9)
Monocytes: 4 %
Neutrophils Absolute: 2.1 x10E3/uL (ref 1.4–7.0)
Neutrophils: 47 %
Platelets: 215 x10E3/uL (ref 150–450)
RBC: 4.19 x10E6/uL (ref 3.77–5.28)
RDW: 11.9 % (ref 11.7–15.4)
WBC: 4.6 x10E3/uL (ref 3.4–10.8)

## 2024-07-01 LAB — LIPID PANEL
Chol/HDL Ratio: 3.1 ratio (ref 0.0–4.4)
Cholesterol, Total: 172 mg/dL (ref 100–199)
HDL: 56 mg/dL (ref >39)
LDL Chol Calc (NIH): 93 mg/dL (ref 0–99)
Triglycerides: 133 mg/dL (ref 0–149)
VLDL Cholesterol Cal: 23 mg/dL (ref 5–40)

## 2024-07-01 LAB — CMP14+EGFR
ALT: 60 IU/L — ABNORMAL HIGH (ref 0–32)
AST: 40 IU/L (ref 0–40)
Albumin: 4.2 g/dL (ref 3.9–4.9)
Alkaline Phosphatase: 117 IU/L — ABNORMAL HIGH (ref 41–116)
BUN/Creatinine Ratio: 10 (ref 9–23)
BUN: 6 mg/dL (ref 6–24)
Bilirubin Total: 0.3 mg/dL (ref 0.0–1.2)
CO2: 18 mmol/L — ABNORMAL LOW (ref 20–29)
Calcium: 10.2 mg/dL (ref 8.7–10.2)
Chloride: 106 mmol/L (ref 96–106)
Creatinine, Ser: 0.6 mg/dL (ref 0.57–1.00)
Globulin, Total: 2.2 g/dL (ref 1.5–4.5)
Glucose: 144 mg/dL — ABNORMAL HIGH (ref 70–99)
Potassium: 4 mmol/L (ref 3.5–5.2)
Sodium: 139 mmol/L (ref 134–144)
Total Protein: 6.4 g/dL (ref 6.0–8.5)
eGFR: 116 mL/min/1.73 (ref >59)

## 2024-07-01 LAB — VITAMIN D 25 HYDROXY (VIT D DEFICIENCY, FRACTURES): Vit D, 25-Hydroxy: 19.4 ng/mL — ABNORMAL LOW (ref 30.0–100.0)

## 2024-07-01 LAB — HEMOGLOBIN A1C
Est. average glucose Bld gHb Est-mCnc: 114 mg/dL
Hgb A1c MFr Bld: 5.6 % (ref 4.8–5.6)

## 2024-07-03 ENCOUNTER — Encounter: Payer: Self-pay | Admitting: Radiology

## 2024-07-03 ENCOUNTER — Other Ambulatory Visit: Payer: Self-pay

## 2024-07-03 ENCOUNTER — Ambulatory Visit (INDEPENDENT_AMBULATORY_CARE_PROVIDER_SITE_OTHER): Payer: Self-pay | Admitting: Primary Care

## 2024-07-03 DIAGNOSIS — E559 Vitamin D deficiency, unspecified: Secondary | ICD-10-CM

## 2024-07-03 LAB — CERVICOVAGINAL ANCILLARY ONLY
Bacterial Vaginitis (gardnerella): NEGATIVE
Candida Glabrata: NEGATIVE
Candida Vaginitis: NEGATIVE
Chlamydia: NEGATIVE
Comment: NEGATIVE
Comment: NEGATIVE
Comment: NEGATIVE
Comment: NEGATIVE
Comment: NEGATIVE
Comment: NORMAL
Neisseria Gonorrhea: NEGATIVE
Trichomonas: NEGATIVE

## 2024-07-03 MED ORDER — ERGOCALCIFEROL 1.25 MG (50000 UT) PO CAPS
50000.0000 [IU] | ORAL_CAPSULE | ORAL | 0 refills | Status: AC
  Filled 2024-07-03: qty 10, 70d supply, fill #0

## 2024-07-05 ENCOUNTER — Other Ambulatory Visit: Payer: Self-pay

## 2024-07-14 ENCOUNTER — Ambulatory Visit (INDEPENDENT_AMBULATORY_CARE_PROVIDER_SITE_OTHER): Payer: Self-pay | Admitting: Primary Care

## 2024-07-16 ENCOUNTER — Encounter (HOSPITAL_COMMUNITY): Payer: Self-pay

## 2024-07-16 ENCOUNTER — Emergency Department (HOSPITAL_COMMUNITY)
Admission: EM | Admit: 2024-07-16 | Discharge: 2024-07-17 | Discharge status: Against Medical Advice | Payer: Self-pay | Attending: Emergency Medicine | Admitting: Emergency Medicine

## 2024-07-16 ENCOUNTER — Other Ambulatory Visit: Payer: Self-pay

## 2024-07-16 DIAGNOSIS — R110 Nausea: Secondary | ICD-10-CM | POA: Insufficient documentation

## 2024-07-16 DIAGNOSIS — R519 Headache, unspecified: Secondary | ICD-10-CM | POA: Insufficient documentation

## 2024-07-16 DIAGNOSIS — Z5321 Procedure and treatment not carried out due to patient leaving prior to being seen by health care provider: Secondary | ICD-10-CM | POA: Insufficient documentation

## 2024-07-16 DIAGNOSIS — H538 Other visual disturbances: Secondary | ICD-10-CM | POA: Insufficient documentation

## 2024-07-16 DIAGNOSIS — R42 Dizziness and giddiness: Secondary | ICD-10-CM | POA: Insufficient documentation

## 2024-07-16 LAB — CBC WITH DIFFERENTIAL/PLATELET
Abs Immature Granulocytes: 0.01 K/uL (ref 0.00–0.07)
Basophils Absolute: 0 K/uL (ref 0.0–0.1)
Basophils Relative: 1 %
Eosinophils Absolute: 0.1 K/uL (ref 0.0–0.5)
Eosinophils Relative: 1 %
HCT: 41.1 % (ref 36.0–46.0)
Hemoglobin: 13.8 g/dL (ref 12.0–15.0)
Immature Granulocytes: 0 %
Lymphocytes Relative: 36 %
Lymphs Abs: 2.1 K/uL (ref 0.7–4.0)
MCH: 30.7 pg (ref 26.0–34.0)
MCHC: 33.6 g/dL (ref 30.0–36.0)
MCV: 91.5 fL (ref 80.0–100.0)
Monocytes Absolute: 0.3 K/uL (ref 0.1–1.0)
Monocytes Relative: 6 %
Neutro Abs: 3.2 K/uL (ref 1.7–7.7)
Neutrophils Relative %: 56 %
Platelets: 236 K/uL (ref 150–400)
RBC: 4.49 MIL/uL (ref 3.87–5.11)
RDW: 11.9 % (ref 11.5–15.5)
WBC: 5.8 K/uL (ref 4.0–10.5)
nRBC: 0 % (ref 0.0–0.2)

## 2024-07-16 LAB — COMPREHENSIVE METABOLIC PANEL WITH GFR
ALT: 60 U/L — ABNORMAL HIGH (ref 0–44)
AST: 48 U/L — ABNORMAL HIGH (ref 15–41)
Albumin: 3.8 g/dL (ref 3.5–5.0)
Alkaline Phosphatase: 83 U/L (ref 38–126)
Anion gap: 8 (ref 5–15)
BUN: 9 mg/dL (ref 6–20)
CO2: 20 mmol/L — ABNORMAL LOW (ref 22–32)
Calcium: 9.9 mg/dL (ref 8.9–10.3)
Chloride: 109 mmol/L (ref 98–111)
Creatinine, Ser: 0.59 mg/dL (ref 0.44–1.00)
GFR, Estimated: >60 mL/min (ref >60)
Glucose, Bld: 84 mg/dL (ref 70–99)
Potassium: 4.2 mmol/L (ref 3.5–5.1)
Sodium: 137 mmol/L (ref 135–145)
Total Bilirubin: 0.5 mg/dL (ref 0.0–1.2)
Total Protein: 7.1 g/dL (ref 6.5–8.1)

## 2024-07-16 LAB — HCG, SERUM, QUALITATIVE: Preg, Serum: NEGATIVE

## 2024-07-16 MED ORDER — ACETAMINOPHEN 500 MG PO TABS
ORAL_TABLET | ORAL | Status: AC
  Filled 2024-07-16: qty 2

## 2024-07-16 MED ORDER — ONDANSETRON 4 MG PO TBDP
4.0000 mg | ORAL_TABLET | Freq: Once | ORAL | Status: AC
  Administered 2024-07-16: 4 mg via ORAL
  Filled 2024-07-16: qty 1

## 2024-07-16 MED ORDER — ACETAMINOPHEN 500 MG PO TABS
1000.0000 mg | ORAL_TABLET | Freq: Once | ORAL | Status: AC
  Administered 2024-07-16: 1000 mg via ORAL

## 2024-07-16 NOTE — ED Triage Notes (Signed)
 PT arrived POV from home c/o a headache, nausea, blurred vision and dizziness x7 days.

## 2024-07-17 ENCOUNTER — Other Ambulatory Visit: Payer: Self-pay | Admitting: Neurology

## 2024-07-17 ENCOUNTER — Other Ambulatory Visit: Payer: Self-pay

## 2024-07-17 ENCOUNTER — Telehealth: Payer: Self-pay | Admitting: Neurology

## 2024-07-17 DIAGNOSIS — G43709 Chronic migraine without aura, not intractable, without status migrainosus: Secondary | ICD-10-CM

## 2024-07-17 MED ORDER — METHYLPREDNISOLONE 4 MG PO TBPK
ORAL_TABLET | ORAL | 0 refills | Status: AC
  Filled 2024-07-17: qty 21, 6d supply, fill #0

## 2024-07-17 MED ORDER — RIZATRIPTAN BENZOATE 10 MG PO TBDP
10.0000 mg | ORAL_TABLET | ORAL | 11 refills | Status: AC | PRN
  Filled 2024-07-17: qty 9, 30d supply, fill #0

## 2024-07-17 NOTE — ED Notes (Signed)
 Pt left d/t wait time stating I have to pick up my kids.SABRA

## 2024-07-17 NOTE — Telephone Encounter (Signed)
 Patient would like to have the cost before she sends to the pharmacy..   Per April at [facility], The Nurtec will cost in the thousand and no assistance for patient with no insurance.

## 2024-07-17 NOTE — Telephone Encounter (Signed)
 [facility] Call ID: [id number]  [facility]: [phone]  Caller name: Declined  Reason for the call: Caller states she spoke with someone last Friday because she ran out of her Rx. She went to the hospital but waited for 10 hours and gave her 2 Tylenol . She is in pain this is the 8th day with bad headache. She has an appt on 08/18/24.

## 2024-07-17 NOTE — Telephone Encounter (Signed)
 [facility] Call ID: [id number]  Caller: Self  PH: [phone]  Reason for the call:  Caller states she is out of her sample medication and would like a new medication prescribed. Caller states she is having sympts of headaches.    Nurse Assessment--- Caller states she was given some sample medications for the headaches 5-6 months ago. States she ran out of medication 2 weeks ago and has been having a headache for 4 days. 3 naproxen  taken 3 hours ago per patient. States the headache was severe and she is having blurry vision that usually happens with her headaches.

## 2024-07-18 ENCOUNTER — Telehealth: Payer: Self-pay | Admitting: Neurology

## 2024-07-18 NOTE — Telephone Encounter (Signed)
LMOVM for patient to give the office a call back. 

## 2024-07-18 NOTE — Telephone Encounter (Signed)
 Tried calling patient no answer. LMOVM Did the patient try the Prednisone  taper.

## 2024-07-18 NOTE — Telephone Encounter (Signed)
 Pt called in this morning and pt stated that the Nurtec was working for her, but it is $2,000. The  prescription called : rizatriptan (MAXALT-MLT) 10 MG disintegrating tablet  is not working for her. The rizatriptan makes the pain worse. Thanks

## 2024-07-20 NOTE — Telephone Encounter (Signed)
 Called [facility] x2 first was [phone] [place]  told what to tell the pt Dr. Venus results and pt answered the phone and he reported something and I heard the pt say what and then the [facility] phone was dead. I Called back to # [phone] and the same thing happened but I was telling him what to say and phone went dead again. Will try back.

## 2024-07-21 ENCOUNTER — Ambulatory Visit (INDEPENDENT_AMBULATORY_CARE_PROVIDER_SITE_OTHER): Payer: Self-pay | Admitting: Primary Care

## 2024-07-24 NOTE — Telephone Encounter (Signed)
 Called pt and left a message on voice mail per Dr. Leigh notes and asked to call office and let us  know if she received the message and understood it.

## 2024-07-25 NOTE — Telephone Encounter (Signed)
 Called and left message to call office

## 2024-07-26 NOTE — Telephone Encounter (Signed)
 Left message on machine for patient to call back.

## 2024-08-01 ENCOUNTER — Other Ambulatory Visit: Payer: Self-pay

## 2024-08-04 NOTE — Telephone Encounter (Signed)
 Left message on machine for patient to call back.

## 2024-08-07 NOTE — Telephone Encounter (Signed)
 Unable to get in touch with pt so a letter was sent out. Per Dr. Leigh answer.

## 2024-08-10 NOTE — Progress Notes (Deleted)
 NEUROLOGY FOLLOW UP OFFICE NOTE  Monica Peters [id number]  Subjective:  Monica Peters is a 38 y.o. year old right-handed female with a medical history of vit D deficiency, depression, pre-DM who we last saw on 02/11/24 for headaches and bilateral hand pain.  To briefly review: 10/14/23: Patient has several issues today: 1.-Headaches - This has been present for > 5 years. It is located on the front of her head to back and behind her eyes. She denies neck pain. Sometimes it is only on the right side. She rates the pain as 9/10. It is pressure sensation with occasional throbbing and pulsating. She denies photophobia and nausea, but endorses phonophobia. The headaches can last 1-3 days. It can occur at any time. She has about 4 headaches per week (~16 headaches per month). She can have associated blurry vision but no vision loss. She will lay down when she gets a headache. She thinks it may get a little worse when she lays down. She will take ibuprofen  or tylenol  when she gets a headache. She gets a little relief. She will take an OTC medication 4 times per week.    She endorses poor sleep due to having a 45 month old baby. She will sleep 5-7 hours but it is broken up. Patient takes oral birth control pill. She is on vit D supplementation.   Caffeine use: 1 cup of coffee per day; 2-3 times a week will have soda Mood: She does endorse some depression and anxiety. She was previously on an antidepressant but stopped when she got pregnant. She does not remember what it was.   2.-Bilateral hand pain - It hurts from the wrist to hands and can go up to the shoulder on the right. It is a burning and numb sensation. She denies clear weakness, but is not sure. She denies neck pain radiating into arms. This has been present for at least 5 years. 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   3.-Left foot pain - About 3 years ago she noticed pain on the medial aspect of her arch  of the left foot. It has since moved to left ankle into her calf. It can be so painful that she cannot walk at time. She has seen ortho and told it was related to her achilles tendon. She was wearing a boot and was taking a pill (but does not know what). She has not followed up with ortho since being pregnant.   She stopping breast feeding 3 months ago. She is a non-smoker. EtOH use: None Restrictive diet: No Family history of neurologic disease including headaches: Grandmother with migraines  I started Topamax  25 mg at bedtime and Sumatriptan  100 mg PRN on 10/14/23 for headaches.  02/11/24: EMG confirmed bilateral CTS, moderate in degree on both sides. This has not gotten any better.   Her headaches are better. She was only getting a headache once every 2 weeks. The headache is not as severe (4/10). She lost her topamax  so has not taken it for a week. Her headaches have been a little worse since then. Her vision is still sometimes blurry. It may be a little worse since last visit. She has no loss. She has not seen an eye doctor.   Sumatriptan  does not help and makes her feel like her head is about to explode. She will take tylenol  which may help a little with the headache.   She did not get her labs drawn as she  did not have the money to pay for it.   Patient is still having left ankle pain. She did not follow up with ortho.   Most recent Assessment and Plan (02/11/24): [name], a 38 y.o. female with: Headaches - She has a family history of migraines and there are some features that sound like migraines. IIH is another possibility, but patient was not eager to do a lumbar puncture to determine her opening pressure. She significantly improved with topamax , reducing headache frequency to 2 HA per month (from ~16 HA per month). This worsened again when she lost her medication a week ago. It does not appear to have affected her blurry vision. She did not tolerate sumatriptan   and it did not work for her. This is challenging as patient is self pay, but will give samples of Nurtec to see if it helps. It may be too expensive though. Bilateral carpal tunnel syndrome - moderate in degree electrically bilaterally by EMG. She is wearing wrist splints with no significant change in symptoms. Blurry vision - unclear etiology. Could be IIH related, but there is no clear papilledema on my exam and it did not improve with topamax . Could be ophthalmologic issue.   Plan: -For headaches: 1. Prevention: refill Topamax  25 mg at bedtime 2. Rescue: Stop sumatriptan . Patient is self pay, so will give samples of Nurtec 75 mg ODT as needed at headache onset. If she likes it, will try to order and see if can get at reasonable price. 3. Limit use of pain relievers to no more than 2 days out of week to prevent risk of rebound or medication-overuse headache. 4. Keep headache diary   For bilateral carpal tunnel syndrome: -Hand surgery referral -Continue wrist splints -Start gabapentin  300 mg twice daily   For blurry vision: -Recommended patient see eye doctor as she has not recently  Since their last visit: Patient was having a headache for 8 days that sumatriptan  could not help in 07/2024. Nurtec worked but was too expensive for patient. I ordered rizatriptan  and a medrol  dose pack on 07/17/24. She called on 07/18/24 and stated the rizatriptan  was not working. We were unable to reach patient to discuss further (phone, MyChart, letter). ***  CTS?  MEDICATIONS:  Outpatient Encounter Medications as of 08/18/2024  Medication Sig Note   methylPREDNISolone  (MEDROL  DOSEPAK) 4 MG TBPK tablet Take 6 tablets for 1 day, then 5 tablets for 1 day, then 4 tablets for 1 day, then 3 tablets for 1 day, then 2 tablets for 1 day, then 1 tablet for 1 day, then STOP.    rizatriptan  (MAXALT -MLT) 10 MG disintegrating tablet Take 1 tablet (10 mg total) by mouth as needed for migraine. May repeat in 2 hours if  needed    acetaminophen  (TYLENOL ) 325 MG tablet Take 650 mg by mouth every 6 (six) hours as needed for mild pain (pain score 1-3).    albuterol  (VENTOLIN  HFA) 108 (90 Base) MCG/ACT inhaler Inhale 2 puffs into the lungs every 6 (six) hours as needed for wheezing or shortness of breath. 05/08/2021: PRN   busPIRone  (BUSPAR ) 5 MG tablet Take 1 tablet (5 mg total) by mouth 2 (two) times daily.    Cholecalciferol  (VITAMIN D3) 50 MCG (2000 UT) capsule Take 1 capsule (2,000 Units total) by mouth daily.    ergocalciferol  (VITAMIN D2) 1.25 MG (50000 UT) capsule Take 1 capsule (50,000 Units total) by mouth once a week.    gabapentin  (NEURONTIN ) 300 MG capsule Take  1 capsule (300 mg total) by mouth 2 (two) times daily.    HYDROcodone -acetaminophen  (NORCO/VICODIN) 5-325 MG tablet Take 1 tablet by mouth 3 (three) times daily as needed for moderate pain (pain score 4-6).    naproxen  (NAPROSYN ) 500 MG tablet Take 1 tablet (500 mg total) by mouth 2 (two) times daily with a meal.    Norethindrone  Acetate-Ethinyl Estrad-FE (TARINA  24 FE) 1-20 MG-MCG(24) tablet Take 1 tablet by mouth daily.    ondansetron  (ZOFRAN ) 4 MG tablet Take 1 tablet (4 mg total) by mouth every 8 (eight) hours as needed for nausea or vomiting.    ondansetron  (ZOFRAN -ODT) 4 MG disintegrating tablet Take 1 tablet (4 mg total) by mouth every 8 (eight) hours as needed for nausea or vomiting. (Patient not taking: No sig reported)    pantoprazole  (PROTONIX ) 20 MG tablet Take 1 tablet (20 mg total) by mouth daily. (Patient not taking: Reported on 10/14/2023)    topiramate  (TOPAMAX ) 25 MG tablet Take 1 tablet (25 mg total) by mouth at bedtime.    traMADol  (ULTRAM ) 50 MG tablet Take 1 tablet (50 mg total) by mouth 3 (three) times daily as needed.    No facility-administered encounter medications on file as of 08/18/2024.    PAST MEDICAL HISTORY: Past Medical History:  Diagnosis Date   Acid reflux    Migraines     PAST SURGICAL HISTORY: Past  Surgical History:  Procedure Laterality Date   CARPAL TUNNEL RELEASE Right 03/15/2024   Procedure: CARPAL TUNNEL RELEASE;  Surgeon: Jerri Kay HERO, MD;  Location: [facility];  Service: Orthopedics;  Laterality: Right;   CHOLECYSTECTOMY  2021    ALLERGIES: Allergies[1]  FAMILY HISTORY: No family history on file.  SOCIAL HISTORY: Social History[2] Social History   Social History Narrative   Are you right handed or left handed? Right   Are you currently employed ? no   What is your current occupation?   Do you live at home alone?no   Who lives with you? kids   What type of home do you live in: 1 story or 2 story? one          Objective:  Vital Signs:  There were no vitals taken for this visit.  ***  Labs and Imaging review: New results: 07/16/24: CMP unremarkable CBC w/ diff unremarkable  06/30/24: Lipid panel: tChol 172, LDL 93, TG 133 Vit D low at 19.4 HbA1c: 5.6  Previously reviewed results: CMP (11/10/23): unremarkable   08/12/23: Vit D: 22.4 Lipid panel: tChol: 183, LDL 109, TG 103 HbA1c: 5.8 CMP significant for AST 139, ALT 127 CBC w/ diff unremarkable   TSH (02/17/22): 1.94   Imaging/Procedures: Sleep study (06/14/20): IMPRESSIONS - No significant obstructive sleep apnea occurred during this study (AHI = 1.0/h). - No significant central sleep apnea occurred during this study (CAI = 0.0/h). - The patient had minimal or no oxygen desaturation during the study (Min O2 = 91.0%) - The patient snored with moderate snoring volume. - No cardiac abnormalities were noted during this study. - Clinically significant periodic limb movements did not occur during sleep. No significant associated arousals.   DIAGNOSIS - Normal study  EMG (11/16/23): NCV & EMG Findings: Extensive electrodiagnostic evaluation of bilateral upper limbs shows: Bilateral median sensory responses show prolonged distal peak latency (L4.7, R7.1 ms) and reduced amplitude  (L17, R16 V). Bilateral ulnar and radial sensory responses are within normal limits. Bilateral median (APB) motor responses show prolonged distal onset latency (L5.5, R6.0  ms). Bilateral ulnar (ADM) motor responses are within normal limits. There is no evidence of active or chronic motor axon loss changes affecting any of the tested muscles on needle examination. Motor unit configuration and recruitment pattern is within normal limits.   Impression: This is an abnormal study. The findings are most consistent with the following: Evidence of bilateral median mononeuropathy at or distal to the wrist, consistent with carpal tunnel syndrome, moderate in degree bilaterally. No electrodiagnostic evidence of a right or left cervical (C5-T1) motor radiculopathy. Screening studies for right or left ulnar or radial mononeuropathies are normal.    Assessment/Plan:  [name], a 38 y.o. female with: ***   Plan: ***Vitamin D  1000 international units every day  Return to clinic in ***  Total time spent reviewing records, interview, history/exam, documentation, and coordination of care on day of encounter:  *** min  Venetia Potters, MD    [1] No Known Allergies [2]  Social History Tobacco Use   Smoking status: Never   Smokeless tobacco: Never  Vaping Use   Vaping status: Never Used  Substance Use Topics   Alcohol use: Never    Comment: occ   Drug use: Never

## 2024-08-18 ENCOUNTER — Ambulatory Visit: Payer: Self-pay | Admitting: Neurology

## 2024-08-21 ENCOUNTER — Encounter (INDEPENDENT_AMBULATORY_CARE_PROVIDER_SITE_OTHER): Payer: Self-pay | Admitting: Primary Care

## 2024-08-21 ENCOUNTER — Ambulatory Visit (INDEPENDENT_AMBULATORY_CARE_PROVIDER_SITE_OTHER): Payer: Self-pay | Admitting: Primary Care

## 2024-08-21 VITALS — BP 136/84 | HR 88 | Resp 16 | Wt 237.8 lb

## 2024-08-21 DIAGNOSIS — G43E09 Chronic migraine with aura, not intractable, without status migrainosus: Secondary | ICD-10-CM

## 2024-08-21 MED ORDER — KETOROLAC TROMETHAMINE 60 MG/2ML IM SOLN
60.0000 mg | Freq: Once | INTRAMUSCULAR | Status: AC
  Administered 2024-08-21: 60 mg via INTRAMUSCULAR

## 2024-08-21 NOTE — Progress Notes (Signed)
 " [facility]  Monica Peters, is a 38 y.o. female  RDW:[id number]  FMW:[id number]  DOB - 04-Mar-1986  Chief Complaint  Patient presents with   Medication Management   Breast Pain    Had a pain 1 month ago in right breast        Subjective:   Monica Peters is a 38 y.o. female here today for an acute visit. Entire  Right breast pain for 1 month self resolved. LMP 07/22/24 . Migraines continue seen by Dr. He will neurology she was given a prescription for rizatriptan  which cost $2000 and not affordable.  Due to the cost to make affordable if she obtains were not helping there is not much available.  Patient stated she missed her last appointment due to transportation issues call and reschedule appointment is in July.  She also is having some vision changes let patient know she will need to go to affordable [facility] example having at [facility] cost approximately $99 to have vision evaluated.  At this time we do not have a ophthalmologist on the orange card.   HPI  No problems updated.  Comprehensive ROS Pertinent positive and negative noted in HPI   Allergies[1]  Past Medical History:  Diagnosis Date   Acid reflux    Migraines     Medications Ordered Prior to Encounter[2] Health Maintenance  Topic Date Due   Pneumococcal Vaccine (1 of 2 - PCV) Never done   Hepatitis B Vaccine (1 of 3 - 19+ 3-dose series) Never done   HPV Vaccine (1 - 3-dose SCDM series) Never done   Breast Cancer Screening  Never done   Flu Shot  03/31/2024   COVID-19 Vaccine (2 - 2025-26 season) 05/01/2024   Pap with HPV screening  04/20/2028   DTaP/Tdap/Td vaccine (2 - Td or Tdap) 10/05/2029   Hepatitis C Screening  Completed   HIV Screening  Completed   Meningitis B Vaccine  Aged Out    Objective:   Vitals:   08/21/24 1125  BP: 136/84  Pulse: 88  Resp: 16  SpO2: 99%  Weight: 237 lb 12.8 oz (107.9 kg)    Physical Exam Vitals reviewed.  Constitutional:       Appearance: Normal appearance. She is obese.  HENT:     Head: Normocephalic.     Right Ear: Tympanic membrane, ear canal and external ear normal.     Left Ear: Tympanic membrane, ear canal and external ear normal.     Nose: Nose normal.     Mouth/Throat:     Mouth: Mucous membranes are moist.  Eyes:     Extraocular Movements: Extraocular movements intact.     Pupils: Pupils are equal, round, and reactive to light.  Cardiovascular:     Rate and Rhythm: Normal rate.  Pulmonary:     Effort: Pulmonary effort is normal.     Breath sounds: Normal breath sounds.  Abdominal:     General: Bowel sounds are normal.     Palpations: Abdomen is soft.  Musculoskeletal:        General: Normal range of motion.     Cervical back: Normal range of motion.  Skin:    General: Skin is warm and dry.  Neurological:     Mental Status: She is alert and oriented to person, place, and time.  Psychiatric:        Mood and Affect: Mood normal.        Behavior: Behavior normal.  Thought Content: Thought content normal.       Assessment & Plan  Ramsha was seen today for medication management and breast pain.  Diagnoses and all orders for this visit:  Chronic migraine with aura without status migrainosus, not intractable -     ketorolac  (TORADOL ) injection 60 mg     Patient have been counseled extensively about nutrition and exercise. Other issues discussed during this visit include: low cholesterol diet, weight control and daily exercise, foot care, annual eye examinations at Ophthalmology, importance of adherence with medications and regular follow-up. We also discussed long term complications of uncontrolled diabetes and hypertension.   No follow-ups on file.  The patient was given clear instructions to go to ER or return to medical center if symptoms don't improve, worsen or new problems develop. The patient verbalized understanding. The patient was told to call to get lab results if they  haven't heard anything in the next week.   This note has been created with Education officer, environmental. Any transcriptional errors are unintentional.   Monica SHAUNNA Bohr, NP 08/21/2024, 11:43 AM     [1] No Known Allergies [2]  Current Outpatient Medications on File Prior to Visit  Medication Sig Dispense Refill   methylPREDNISolone  (MEDROL  DOSEPAK) 4 MG TBPK tablet Take 6 tablets for 1 day, then 5 tablets for 1 day, then 4 tablets for 1 day, then 3 tablets for 1 day, then 2 tablets for 1 day, then 1 tablet for 1 day, then STOP. 21 tablet 0   rizatriptan  (MAXALT -MLT) 10 MG disintegrating tablet Take 1 tablet (10 mg total) by mouth as needed for migraine. May repeat in 2 hours if needed 9 tablet 11   acetaminophen  (TYLENOL ) 325 MG tablet Take 650 mg by mouth every 6 (six) hours as needed for mild pain (pain score 1-3).     albuterol  (VENTOLIN  HFA) 108 (90 Base) MCG/ACT inhaler Inhale 2 puffs into the lungs every 6 (six) hours as needed for wheezing or shortness of breath. 8 g 0   busPIRone  (BUSPAR ) 5 MG tablet Take 1 tablet (5 mg total) by mouth 2 (two) times daily. 60 tablet 0   Cholecalciferol  (VITAMIN D3) 50 MCG (2000 UT) capsule Take 1 capsule (2,000 Units total) by mouth daily. 90 capsule 1   ergocalciferol  (VITAMIN D2) 1.25 MG (50000 UT) capsule Take 1 capsule (50,000 Units total) by mouth once a week. 10 capsule 0   gabapentin  (NEURONTIN ) 300 MG capsule Take 1 capsule (300 mg total) by mouth 2 (two) times daily. 60 capsule 11   HYDROcodone -acetaminophen  (NORCO/VICODIN) 5-325 MG tablet Take 1 tablet by mouth 3 (three) times daily as needed for moderate pain (pain score 4-6). 20 tablet 0   naproxen  (NAPROSYN ) 500 MG tablet Take 1 tablet (500 mg total) by mouth 2 (two) times daily with a meal. 30 tablet 3   Norethindrone  Acetate-Ethinyl Estrad-FE (TARINA  24 FE) 1-20 MG-MCG(24) tablet Take 1 tablet by mouth daily. 84 tablet 0   ondansetron  (ZOFRAN ) 4 MG  tablet Take 1 tablet (4 mg total) by mouth every 8 (eight) hours as needed for nausea or vomiting. 40 tablet 0   ondansetron  (ZOFRAN -ODT) 4 MG disintegrating tablet Take 1 tablet (4 mg total) by mouth every 8 (eight) hours as needed for nausea or vomiting. (Patient not taking: No sig reported) 20 tablet 0   pantoprazole  (PROTONIX ) 20 MG tablet Take 1 tablet (20 mg total) by mouth daily. (Patient not taking: Reported on 10/14/2023)  30 tablet 0   topiramate  (TOPAMAX ) 25 MG tablet Take 1 tablet (25 mg total) by mouth at bedtime. 30 tablet 5   traMADol  (ULTRAM ) 50 MG tablet Take 1 tablet (50 mg total) by mouth 3 (three) times daily as needed. 21 tablet 0   No current facility-administered medications on file prior to visit.   "

## 2024-09-14 ENCOUNTER — Other Ambulatory Visit: Payer: Self-pay

## 2024-09-14 ENCOUNTER — Other Ambulatory Visit (INDEPENDENT_AMBULATORY_CARE_PROVIDER_SITE_OTHER): Payer: Self-pay

## 2024-09-14 ENCOUNTER — Other Ambulatory Visit (INDEPENDENT_AMBULATORY_CARE_PROVIDER_SITE_OTHER): Payer: Self-pay | Admitting: Primary Care

## 2024-09-14 NOTE — Telephone Encounter (Signed)
 Will forward to provider

## 2024-09-15 ENCOUNTER — Other Ambulatory Visit: Payer: Self-pay

## 2024-09-18 ENCOUNTER — Other Ambulatory Visit (INDEPENDENT_AMBULATORY_CARE_PROVIDER_SITE_OTHER): Payer: Self-pay | Admitting: Primary Care

## 2024-09-18 ENCOUNTER — Other Ambulatory Visit: Payer: Self-pay

## 2024-09-18 MED ORDER — TARINA 24 FE 1-20 MG-MCG(24) PO TABS
1.0000 | ORAL_TABLET | Freq: Every day | ORAL | 0 refills | Status: AC
  Filled 2024-09-18: qty 84, 84d supply, fill #0

## 2024-09-18 NOTE — Telephone Encounter (Signed)
 Requested Prescriptions  Pending Prescriptions Disp Refills   Norethindrone  Acetate-Ethinyl Estrad-FE (TARINA  24 FE) 1-20 MG-MCG(24) tablet 84 tablet 0    Sig: Take 1 tablet by mouth daily.     OB/GYN:  Contraceptives Passed - 09/18/2024  3:28 PM      Passed - Last BP in normal range    BP Readings from Last 1 Encounters:  08/21/24 136/84         Passed - Valid encounter within last 12 months    Recent Outpatient Visits           4 weeks ago Chronic migraine with aura without status migrainosus, not intractable   [place] [facility] Celestia Rosaline SQUIBB, NP   4 months ago Abnormal uterine bleeding (AUB)   [place] [facility] Celestia Rosaline SQUIBB, NP   10 months ago Elevated liver enzymes   [place] [facility] Celestia Rosaline SQUIBB, NP   1 year ago Abnormal uterine bleeding (AUB)   [place] [facility] Celestia Rosaline SQUIBB, NP   1 year ago Cervical cancer screening   [place] [facility] Celestia Rosaline SQUIBB, NP              Passed - Patient is not a smoker

## 2024-11-20 ENCOUNTER — Ambulatory Visit (INDEPENDENT_AMBULATORY_CARE_PROVIDER_SITE_OTHER): Payer: Self-pay | Admitting: Primary Care

## 2025-03-21 ENCOUNTER — Ambulatory Visit: Payer: Self-pay | Admitting: Neurology

## 2025-03-28 ENCOUNTER — Ambulatory Visit: Payer: Self-pay | Admitting: Neurology
